# Patient Record
Sex: Female | Born: 1970 | Race: White | Hispanic: No | Marital: Married | Smoking: Never smoker
Health system: Southern US, Community
[De-identification: ages and names within clinical notes are randomized; demographics above are authoritative.]

## PROBLEM LIST (undated history)

## (undated) DIAGNOSIS — T7840XA Allergy, unspecified, initial encounter: Secondary | ICD-10-CM

## (undated) DIAGNOSIS — G43909 Migraine, unspecified, not intractable, without status migrainosus: Secondary | ICD-10-CM

## (undated) HISTORY — DX: Migraine, unspecified, not intractable, without status migrainosus: G43.909

## (undated) HISTORY — PX: CHOLECYSTECTOMY: SHX55

## (undated) HISTORY — DX: Allergy, unspecified, initial encounter: T78.40XA

## (undated) HISTORY — PX: ADENOIDECTOMY: SUR15

## (undated) HISTORY — PX: EYE SURGERY: SHX253

---

## 1998-12-14 ENCOUNTER — Other Ambulatory Visit: Admission: RE | Admit: 1998-12-14 | Discharge: 1998-12-14 | Payer: Self-pay | Admitting: Family Medicine

## 1999-12-20 ENCOUNTER — Other Ambulatory Visit: Admission: RE | Admit: 1999-12-20 | Discharge: 1999-12-20 | Payer: Self-pay | Admitting: *Deleted

## 2005-05-03 ENCOUNTER — Other Ambulatory Visit: Admission: RE | Admit: 2005-05-03 | Discharge: 2005-05-03 | Payer: Self-pay | Admitting: Family Medicine

## 2006-05-11 ENCOUNTER — Other Ambulatory Visit: Admission: RE | Admit: 2006-05-11 | Discharge: 2006-05-11 | Payer: Self-pay | Admitting: Family Medicine

## 2007-07-11 HISTORY — PX: CHOLECYSTECTOMY: SHX55

## 2007-10-09 ENCOUNTER — Other Ambulatory Visit: Admission: RE | Admit: 2007-10-09 | Discharge: 2007-10-09 | Payer: Self-pay | Admitting: Family Medicine

## 2008-03-29 ENCOUNTER — Emergency Department (HOSPITAL_COMMUNITY): Admission: EM | Admit: 2008-03-29 | Discharge: 2008-03-29 | Payer: Self-pay | Admitting: Emergency Medicine

## 2008-05-06 ENCOUNTER — Ambulatory Visit (HOSPITAL_COMMUNITY): Admission: RE | Admit: 2008-05-06 | Discharge: 2008-05-06 | Payer: Self-pay | Admitting: General Surgery

## 2008-05-06 ENCOUNTER — Encounter (INDEPENDENT_AMBULATORY_CARE_PROVIDER_SITE_OTHER): Payer: Self-pay | Admitting: General Surgery

## 2008-10-13 ENCOUNTER — Encounter: Admission: RE | Admit: 2008-10-13 | Discharge: 2008-10-13 | Payer: Self-pay | Admitting: Family Medicine

## 2008-10-13 ENCOUNTER — Other Ambulatory Visit: Admission: RE | Admit: 2008-10-13 | Discharge: 2008-10-13 | Payer: Self-pay | Admitting: Family Medicine

## 2009-11-02 ENCOUNTER — Encounter: Admission: RE | Admit: 2009-11-02 | Discharge: 2009-11-02 | Payer: Self-pay | Admitting: Family Medicine

## 2010-09-23 ENCOUNTER — Other Ambulatory Visit (HOSPITAL_COMMUNITY)
Admission: RE | Admit: 2010-09-23 | Discharge: 2010-09-23 | Disposition: A | Discharge status: Home / Self Care | Payer: BC Managed Care – PPO | Source: Ambulatory Visit | Attending: Family Medicine | Admitting: Family Medicine

## 2010-09-23 ENCOUNTER — Other Ambulatory Visit: Payer: Self-pay | Admitting: Family Medicine

## 2010-09-23 DIAGNOSIS — Z01419 Encounter for gynecological examination (general) (routine) without abnormal findings: Secondary | ICD-10-CM | POA: Insufficient documentation

## 2010-11-22 NOTE — Op Note (Signed)
Pamela Lin, Pamela Lin                ACCOUNT NO.:  [id number]   MEDICAL RECORD NO.:  [id number]          PATIENT TYPE:  AMB   LOCATION:  DAY                          FACILITY:  [facility]   PHYSICIAN:  Anselm Pancoast. Weatherly, M.D.DATE OF BIRTH:  Oct 10, 1970   DATE OF PROCEDURE:  05/06/2008  DATE OF DISCHARGE:                               OPERATIVE REPORT   PREOPERATIVE DIAGNOSIS:  Chronic cholecystitis with stones.   POSTOPERATIVE DIAGNOSIS:  Chronic cholecystitis with stones.   OPERATION:  Laparoscopic cholecystectomy with cholangiogram.   SURGEON:  Anselm Pancoast. Zachery Dakins, MD   ASSISTANT:  Almond Lint, MD   ANESTHESIA:  General anesthesia.   HISTORY:  Pamela Lin is a 40 year old Caucasian female who is referred  to me through the courtesy of Dr. Wynelle Link with symptomatic gallstones.  The  patient has had no previous pregnancies.  She is presently trying to  become pregnant and not on birth control pills but she has had some  intermittent episodes of upper abdominal pain and the most significant  prompted her to go to the emergency room.  While she was waiting her  symptoms subsided and the nurse talked to her and said it sounded like  she was having a gallbladder attack.  She then saw her regular  physician, did not actually get seen by the emergency room physician,  and they ordered laboratory studies and then an ultrasound that showed  stones within her gallbladder.  Her liver tests were unremarkable and  she has had other little episodes of pain but not really that of a  continuous like acute or subacute cholecystitis.  She has lost about 40  pounds.  She is no longer on birth control pills and otherwise is in  normal condition.  She has not had a previous pregnancy.   Her repeat CBC is unremarkable.  I did not repeat the liver function  studies but she was given 3 grams of Unasyn  and taken to the operative  suite.  The abdomen after induction of general anesthesia and oral tube  to  the stomach was prepped with Betadine solution and draped in a  sterile manner.  A small incision was made below the umbilicus.  The  fascia was identified and picked up between two Kochers and kind of  carefully entered the peritoneal cavity.  The upper 10 mm trocar was  placed under direct vision.  The gallbladder was not acutely inflamed  but had a lot of adhesions around it as if she has had previous attacks.  The two lateral 5 mm trocars were placed by Dr. Donell Beers and at first we  kind of lifted the gallbladder up superiorly and then the adhesions were  carefully dissected using the scissors with very minimal cautery and the  duodenum was kind of not inflamed but some adhesions going to the fundus  of the gallbladder.  These were carefully divided and then you could see  the cystic duct which was encompassing the right angle.  I made a clip  with a function of the cystic duct/gallbladder junction and then made a  little opening proximally.  There was a little bit of bleeder that  required light coagulation on the cystic duct and then the catheter was  inserted and held in place with clip.  The x-ray was obtained which  showed that she kind of had a tortuous cystic duct that is laying on top  of the common bile duct but the intrahepatic radicals filled nicely and  good flow into the duodenum.  The catheter was removed.  The cystic duct  was triply clipped.  I looked at it carefully where we had coagulated it  and the clips were proximal to that little area and then the cystic  artery was identified, encompassed, doubly clipped proximally, singly  distally and divided and then the gallbladder was freed from its liver.  The adhesions were carefully coagulated.  The gallbladder was placed in  the EndoCatch bag and then with the camera switched to the upper trocar  withdrawn.  There was a pretty prominent stone in the fundus of the  gallbladder.  The irrigating fluid was aspirated.  I looked  at where the  cystic duct and cystic artery had been clipped and there was no evidence  of bleeding and then we put an additional figure-of-eight suture of 0  Vicryl in the fascia at the umbilicus, removed the 5 mm ports under  direct vision and then withdrew the upper trocar.  I did put a figure-of-  eight of 0 Vicryl in the fascia at the umbilicus.  She has got pretty  strong fascia but you can see into the peritoneal cavity.  The  subcutaneous wounds were closed with 4-0 Vicryl, benzoin and Steri-  Strips on the skin.  The patient tolerated the procedure nicely and was  sent to the recovery room in stable postop condition.  I think she is  planning on going home this afternoon and she should have no problems  with that.  I will see her back in the office in approximately 2 weeks.  She will resume her chronic medications.           ______________________________  Anselm Pancoast. Zachery Dakins, M.D.     WJW/MEDQ  D:  05/06/2008  T:  05/06/2008  Job:  [id number]   cc:   Deatra James, M.D.  Fax: [phone]

## 2011-04-10 LAB — URINALYSIS, ROUTINE W REFLEX MICROSCOPIC
Bilirubin Urine: NEGATIVE
Glucose, UA: NEGATIVE
Ketones, ur: NEGATIVE
Urobilinogen, UA: 0.2
pH: 5.5

## 2011-04-10 LAB — POCT PREGNANCY, URINE: Preg Test, Ur: NEGATIVE

## 2011-04-11 LAB — COMPREHENSIVE METABOLIC PANEL
Albumin: 3.9
Chloride: 106
GFR calc Af Amer: >60
GFR calc non Af Amer: >60
Glucose, Bld: 101 — ABNORMAL HIGH
Sodium: 139
Total Bilirubin: 1
Total Protein: 6.4

## 2011-04-11 LAB — CBC
HCT: 38.1
MCHC: 34.1
MCV: 91.8
RDW: 13.3

## 2011-04-11 LAB — DIFFERENTIAL
Basophils Relative: 1
Monocytes Relative: 13 — ABNORMAL HIGH
Neutro Abs: 2.7
Neutrophils Relative %: 55

## 2012-06-20 ENCOUNTER — Other Ambulatory Visit: Payer: Self-pay | Admitting: Family Medicine

## 2012-06-20 DIAGNOSIS — Z1231 Encounter for screening mammogram for malignant neoplasm of breast: Secondary | ICD-10-CM

## 2012-08-05 ENCOUNTER — Ambulatory Visit
Admission: RE | Admit: 2012-08-05 | Discharge: 2012-08-05 | Disposition: A | Discharge status: Home / Self Care | Payer: BC Managed Care – PPO | Source: Ambulatory Visit | Attending: Family Medicine | Admitting: Family Medicine

## 2012-08-05 DIAGNOSIS — Z1231 Encounter for screening mammogram for malignant neoplasm of breast: Secondary | ICD-10-CM

## 2013-02-14 ENCOUNTER — Other Ambulatory Visit (HOSPITAL_COMMUNITY)
Admission: RE | Admit: 2013-02-14 | Discharge: 2013-02-14 | Disposition: A | Discharge status: Home / Self Care | Payer: BC Managed Care – PPO | Source: Ambulatory Visit | Attending: Family Medicine | Admitting: Family Medicine

## 2013-02-14 ENCOUNTER — Other Ambulatory Visit: Payer: Self-pay | Admitting: Family Medicine

## 2013-02-14 DIAGNOSIS — Z124 Encounter for screening for malignant neoplasm of cervix: Secondary | ICD-10-CM | POA: Insufficient documentation

## 2013-02-14 LAB — HM PAP SMEAR: HM PAP: NEGATIVE

## 2014-01-14 ENCOUNTER — Other Ambulatory Visit: Payer: Self-pay

## 2014-01-14 DIAGNOSIS — Z1231 Encounter for screening mammogram for malignant neoplasm of breast: Secondary | ICD-10-CM

## 2014-01-15 ENCOUNTER — Encounter (INDEPENDENT_AMBULATORY_CARE_PROVIDER_SITE_OTHER): Payer: Self-pay

## 2014-01-15 ENCOUNTER — Ambulatory Visit
Admission: RE | Admit: 2014-01-15 | Discharge: 2014-01-15 | Disposition: A | Discharge status: Home / Self Care | Payer: BC Managed Care – PPO | Source: Ambulatory Visit

## 2014-01-15 DIAGNOSIS — Z1231 Encounter for screening mammogram for malignant neoplasm of breast: Secondary | ICD-10-CM

## 2015-01-07 ENCOUNTER — Other Ambulatory Visit: Payer: Self-pay

## 2015-01-07 DIAGNOSIS — Z1231 Encounter for screening mammogram for malignant neoplasm of breast: Secondary | ICD-10-CM

## 2015-01-21 ENCOUNTER — Ambulatory Visit
Admission: RE | Admit: 2015-01-21 | Discharge: 2015-01-21 | Disposition: A | Discharge status: Home / Self Care | Payer: BC Managed Care – PPO | Source: Ambulatory Visit

## 2015-01-21 DIAGNOSIS — Z1231 Encounter for screening mammogram for malignant neoplasm of breast: Secondary | ICD-10-CM

## 2015-09-06 LAB — BASIC METABOLIC PANEL
BUN: 13 mg/dL (ref 4–21)
Creatinine: 0.6 mg/dL (ref 0.5–1.1)
GLUCOSE: 93 mg/dL
Potassium: 4 mmol/L (ref 3.4–5.3)
Sodium: 138 mmol/L (ref 137–147)

## 2015-09-06 LAB — TSH: TSH: 1.18 u[IU]/mL (ref 0.41–5.90)

## 2015-09-06 LAB — FOLLICLE STIMULATING HORMONE: FSH: 75.8

## 2016-01-14 ENCOUNTER — Other Ambulatory Visit: Payer: Self-pay | Admitting: Family Medicine

## 2016-01-14 DIAGNOSIS — Z1231 Encounter for screening mammogram for malignant neoplasm of breast: Secondary | ICD-10-CM

## 2016-01-28 ENCOUNTER — Ambulatory Visit
Admission: RE | Admit: 2016-01-28 | Discharge: 2016-01-28 | Disposition: A | Discharge status: Home / Self Care | Payer: BC Managed Care – PPO | Source: Ambulatory Visit | Attending: Family Medicine | Admitting: Family Medicine

## 2016-01-28 DIAGNOSIS — Z1231 Encounter for screening mammogram for malignant neoplasm of breast: Secondary | ICD-10-CM

## 2016-01-28 LAB — HM MAMMOGRAPHY

## 2016-10-12 ENCOUNTER — Ambulatory Visit (INDEPENDENT_AMBULATORY_CARE_PROVIDER_SITE_OTHER): Payer: BC Managed Care – PPO | Admitting: Adult Health

## 2016-10-12 ENCOUNTER — Encounter: Payer: Self-pay | Admitting: Adult Health

## 2016-10-12 DIAGNOSIS — Z0000 Encounter for general adult medical examination without abnormal findings: Secondary | ICD-10-CM | POA: Diagnosis not present

## 2016-10-12 DIAGNOSIS — Z9109 Other allergy status, other than to drugs and biological substances: Secondary | ICD-10-CM

## 2016-10-12 DIAGNOSIS — E669 Obesity, unspecified: Secondary | ICD-10-CM

## 2016-10-12 DIAGNOSIS — E6609 Other obesity due to excess calories: Secondary | ICD-10-CM | POA: Insufficient documentation

## 2016-10-12 NOTE — Assessment & Plan Note (Signed)
Previous allergy testing revealed allergy to dust mites. Continue Azelastine and Fluticasone nasal sprays as directed. OTC antihistamine when sx' flare up.

## 2016-10-12 NOTE — Assessment & Plan Note (Signed)
  Increase regular exercise-YouTube, Pinterest exercise programs, add in weight training. Increase water consumption to at least 100 ounces/daily. Please schedule CPE/fasting labs at your convenience.

## 2016-10-12 NOTE — Progress Notes (Signed)
Subjective:    Patient ID: Pamela Lin, female    DOB: 30-Mar-1971, 46 y.o.   MRN: [id number]  HPI:  Pamela Lin presents to establish as a new pt. She is a pleasant 46 year old woman.  PMH:  Dust Mite allergy and menopause.  She follows a vegetarian diet, drinks > 60 ounces of water daily, and achieves 8,000-11,000 steps daily.  Since menopause she has gained > 30 lbs.  Her comfortable/goal weight is 160-165#.  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    Patient Care Team    Relationship Specialty Notifications Start End  Pamela Aquas, NP PCP - General Family Medicine  10/12/16     Patient Active Problem List   Diagnosis Date Noted  . Health care maintenance 10/12/2016     Past Medical History:  Diagnosis Date  . Allergy      Past Surgical History:  Procedure Laterality Date  . ADENOIDECTOMY    . CHOLECYSTECTOMY       Family History  Problem Relation Age of Onset  . Cancer Mother     breast  . Cancer Father     colon  . Cancer Maternal Aunt     breast  . Cancer Paternal Aunt     breast  . Cancer Maternal Grandmother     breast  . Cancer Paternal Grandmother     breast     History  Drug Use No     History  Alcohol Use  . Yes    Comment: socially     History  Smoking Status  . Never Smoker  Smokeless Tobacco  . Never Used     Outpatient Encounter Prescriptions as of 10/12/2016  Medication Sig  . azelastine (ASTELIN) 0.1 % nasal spray Place 2 sprays into both nostrils as needed for rhinitis. Use in each nostril as directed  . Calcium Carbonate-Vitamin D3 (CALCIUM 600-D) 600-400 MG-UNIT TABS Take 2 tablets by mouth daily.  . fluticasone (FLONASE) 50 MCG/ACT nasal spray Place 2 sprays into both nostrils as needed for allergies or rhinitis.  Marland Kitchen L-Phenylalanine 500 MG TABS Take 1-2 tablets by mouth daily.   No facility-administered encounter medications on file as of 10/12/2016.     Allergies: Dust mite extract  Body mass index is 33.13 kg/m.  Blood pressure  112/73, pulse (!) 59, height 5' 5.75" (1.67 m), weight 203 lb 11.2 oz (92.4 kg).   Review of Systems  Constitutional: Negative for activity change, appetite change, chills, diaphoresis, fatigue, fever and unexpected weight change.  HENT: Negative for congestion.   Eyes: Negative for visual disturbance.  Respiratory: Negative for cough, chest tightness, shortness of breath, wheezing and stridor.   Cardiovascular: Negative for chest pain, palpitations and leg swelling.  Gastrointestinal: Negative for abdominal distention, abdominal pain, blood in stool, constipation, diarrhea, nausea and vomiting.  Endocrine: Negative for cold intolerance, heat intolerance, polydipsia, polyphagia and polyuria.  Genitourinary: Positive for menstrual problem. Negative for difficulty urinating and flank pain.       LMP- Fall 2016 Experiencing intermittent hot flashes and 30 lb weight gain.  Musculoskeletal: Negative for arthralgias, back pain, gait problem, joint swelling, myalgias, neck pain and neck stiffness.  Skin: Negative for color change, pallor, rash and wound.  Allergic/Immunologic: Negative for immunocompromised state.  Neurological: Negative for dizziness and headaches.  Hematological: Does not bruise/bleed easily.  Psychiatric/Behavioral: Negative for agitation, dysphoric mood and sleep disturbance. The patient is not nervous/anxious and is not hyperactive.  Objective:   Physical Exam  Constitutional: She is oriented to person, place, and time. She appears well-developed and well-nourished. No distress.  HENT:  Head: Normocephalic and atraumatic.  Right Ear: Hearing, tympanic membrane and external ear normal. No decreased hearing is noted.  Left Ear: Hearing, tympanic membrane and external ear normal. No decreased hearing is noted.  Nose: Nose normal. Right sinus exhibits no maxillary sinus tenderness and no frontal sinus tenderness. Left sinus exhibits no maxillary sinus tenderness and no  frontal sinus tenderness.  Mouth/Throat: Uvula is midline.  Eyes: Conjunctivae are normal.  Neck: Normal range of motion. Neck supple.  Cardiovascular: Normal rate, regular rhythm, normal heart sounds and intact distal pulses.   No murmur heard. Pulmonary/Chest: Effort normal and breath sounds normal. No respiratory distress. She has no wheezes. She has no rales. She exhibits no tenderness.  Abdominal: Soft. Bowel sounds are normal. She exhibits no distension and no mass. There is no tenderness. There is no rebound and no guarding.  Musculoskeletal: Normal range of motion.  Lymphadenopathy:    She has no cervical adenopathy.  Neurological: She is alert and oriented to person, place, and time.  Skin: Skin is warm and dry. No rash noted. She is not diaphoretic. No erythema. No pallor.  Psychiatric: She has a normal mood and affect. Her behavior is normal. Judgment and thought content normal.  Nursing note and vitals reviewed.         Assessment & Plan:   1. Health care maintenance   2. Obesity (BMI 30-39.9)   3. History of environmental allergies     Health care maintenance  Increase regular exercise-YouTube, Pinterest exercise programs, add in weight training. Increase water consumption to at least 100 ounces/daily. Please schedule CPE/fasting labs at your convenience.   History of environmental allergies Previous allergy testing revealed allergy to dust mites. Continue Azelastine and Fluticasone nasal sprays as directed. OTC antihistamine when sx' flare up.    FOLLOW-UP:  Return in about 1 year (around 10/12/2017) for Regular Follow Up.

## 2016-10-12 NOTE — Patient Instructions (Signed)
Heart-Healthy Eating Plan Many factors influence your heart health, including eating and exercise habits. Heart (coronary) risk increases with abnormal blood fat (lipid) levels. Heart-healthy meal planning includes limiting unhealthy fats, increasing healthy fats, and making other small dietary changes. This includes maintaining a healthy body weight to help keep lipid levels within a normal range. What is my plan? Your health care provider recommends that you:  Get no more than _________% of the total calories in your daily diet from fat.  Limit your intake of saturated fat to less than _________% of your total calories each day.  Limit the amount of cholesterol in your diet to less than _________ mg per day. What types of fat should I choose?  Choose healthy fats more often. Choose monounsaturated and polyunsaturated fats, such as olive oil and canola oil, flaxseeds, walnuts, almonds, and seeds.  Eat more omega-3 fats. Good choices include salmon, mackerel, sardines, tuna, flaxseed oil, and ground flaxseeds. Aim to eat fish at least two times each week.  Limit saturated fats. Saturated fats are primarily found in animal products, such as meats, butter, and cream. Plant sources of saturated fats include palm oil, palm kernel oil, and coconut oil.  Avoid foods with partially hydrogenated oils in them. These contain trans fats. Examples of foods that contain trans fats are stick margarine, some tub margarines, cookies, crackers, and other baked goods. What general guidelines do I need to follow?  Check food labels carefully to identify foods with trans fats or high amounts of saturated fat.  Fill one half of your plate with vegetables and green salads. Eat 4-5 servings of vegetables per day. A serving of vegetables equals 1 cup of raw leafy vegetables,  cup of raw or cooked cut-up vegetables, or  cup of vegetable juice.  Fill one fourth of your plate with whole grains. Look for the word  "whole" as the first word in the ingredient list.  Fill one fourth of your plate with lean protein foods.  Eat 4-5 servings of fruit per day. A serving of fruit equals one medium whole fruit,  cup of dried fruit,  cup of fresh, frozen, or canned fruit, or  cup of 100% fruit juice.  Eat more foods that contain soluble fiber. Examples of foods that contain this type of fiber are apples, broccoli, carrots, beans, peas, and barley. Aim to get 20-30 g of fiber per day.  Eat more home-cooked food and less restaurant, buffet, and fast food.  Limit or avoid alcohol.  Limit foods that are high in starch and sugar.  Avoid fried foods.  Cook foods by using methods other than frying. Baking, boiling, grilling, and broiling are all great options. Other fat-reducing suggestions include:  Removing the skin from poultry.  Removing all visible fats from meats.  Skimming the fat off of stews, soups, and gravies before serving them.  Steaming vegetables in water or broth.  Lose weight if you are overweight. Losing just 5-10% of your initial body weight can help your overall health and prevent diseases such as diabetes and heart disease.  Increase your consumption of nuts, legumes, and seeds to 4-5 servings per week. One serving of dried beans or legumes equals  cup after being cooked, one serving of nuts equals 1 ounces, and one serving of seeds equals  ounce or 1 tablespoon.  You may need to monitor your salt (sodium) intake, especially if you have high blood pressure. Talk with your health care provider or dietitian to get more  information about reducing sodium. What foods can I eat? Grains   Breads, including [place], white, pita, wheat, raisin, rye, oatmeal, and [place]. Tortillas that are neither fried nor made with lard or trans fat. Low-fat rolls, including hotdog and hamburger buns and English muffins. Biscuits. Muffins. Waffles. Pancakes. Light popcorn. Whole-grain cereals. Flatbread.  Melba toast. Pretzels. Breadsticks. Rusks. Low-fat snacks and crackers, including oyster, saltine, matzo, graham, animal, and rye. Rice and pasta, including [place] rice and those that are made with whole wheat. Vegetables  All vegetables. Fruits  All fruits, but limit coconut. Meats and Other Protein Sources  Lean, well-trimmed beef, veal, pork, and lamb. Chicken and [place] without skin. All fish and shellfish. Wild duck, rabbit, pheasant, and venison. Egg whites or low-cholesterol egg substitutes. Dried beans, peas, lentils, and tofu.Seeds and most nuts. Dairy  Low-fat or nonfat cheeses, including ricotta, string, and mozzarella. Skim or 1% milk that is liquid, powdered, or evaporated. Buttermilk that is made with low-fat milk. Nonfat or low-fat yogurt. Beverages  Mineral water. Diet carbonated beverages. Sweets and Desserts  Sherbets and fruit ices. Honey, jam, marmalade, jelly, and syrups. Meringues and gelatins. Pure sugar candy, such as hard candy, jelly beans, gumdrops, mints, marshmallows, and small amounts of dark chocolate. [employer] Eat all sweets and desserts in moderation. Fats and Oils  Nonhydrogenated (trans-free) margarines. Vegetable oils, including soybean, sesame, sunflower, olive, peanut, safflower, corn, canola, and cottonseed. Salad dressings or mayonnaise that are made with a vegetable oil. Limit added fats and oils that you use for cooking, baking, salads, and as spreads. Other  Cocoa powder. Coffee and tea. All seasonings and condiments. The items listed above may not be a complete list of recommended foods or beverages. Contact your dietitian for more options.  What foods are not recommended? Grains  Breads that are made with saturated or trans fats, oils, or whole milk. Croissants. Butter rolls. Cheese breads. Sweet rolls. Donuts. Buttered popcorn. Chow mein noodles. High-fat crackers, such as cheese or butter crackers. Meats and Other Protein Sources  Fatty  meats, such as hotdogs, short ribs, sausage, spareribs, bacon, ribeye roast or steak, and mutton. High-fat deli meats, such as salami and bologna. Caviar. Domestic duck and goose. Organ meats, such as kidney, liver, sweetbreads, brains, gizzard, chitterlings, and heart. Dairy  Cream, sour cream, cream cheese, and creamed cottage cheese. Whole milk cheeses, including blue (bleu), Monterey Jack, [place], [place], American, [place], Swiss, [place], [place], and [place]. Whole or 2% milk that is liquid, evaporated, or condensed. Whole buttermilk. Cream sauce or high-fat cheese sauce. Yogurt that is made from whole milk. Beverages  Regular sodas and drinks with added sugar. Sweets and Desserts  Frosting. Pudding. Cookies. Cakes other than angel food cake. Candy that has milk chocolate or white chocolate, hydrogenated fat, butter, coconut, or unknown ingredients. Buttered syrups. Full-fat ice cream or ice cream drinks. Fats and Oils  Gravy that has suet, meat fat, or shortening. Cocoa butter, hydrogenated oils, palm oil, coconut oil, palm kernel oil. These can often be found in baked products, candy, fried foods, nondairy creamers, and whipped toppings. Solid fats and shortenings, including bacon fat, salt pork, lard, and butter. Nondairy cream substitutes, such as coffee creamers and sour cream substitutes. Salad dressings that are made of unknown oils, cheese, or sour cream. The items listed above may not be a complete list of foods and beverages to avoid. Contact your dietitian for more information.  This information is not intended to replace advice given to you by  your health care provider. Make sure you discuss any questions you have with your health care provider. Document Released: 04/04/2008 Document Revised: 01/14/2016 Document Reviewed: 12/18/2013 Elsevier Interactive Patient Education  2017 [employer].  Increase regular exercise-YouTube, Pinterest exercise programs, add in [employer]  training. Increase water consumption to at least 100 ounces/daily. Please schedule CPE/fasting labs at your convenience.

## 2016-10-13 ENCOUNTER — Other Ambulatory Visit (INDEPENDENT_AMBULATORY_CARE_PROVIDER_SITE_OTHER): Payer: BC Managed Care – PPO

## 2016-10-13 ENCOUNTER — Encounter: Payer: Self-pay | Admitting: Adult Health

## 2016-10-13 DIAGNOSIS — Z0000 Encounter for general adult medical examination without abnormal findings: Secondary | ICD-10-CM

## 2016-10-14 LAB — CBC WITH DIFFERENTIAL/PLATELET
BASOS ABS: 0 10*3/uL (ref 0.0–0.2)
Basos: 0 %
EOS (ABSOLUTE): 0 10*3/uL (ref 0.0–0.4)
Eos: 0 %
HEMOGLOBIN: 13.7 g/dL (ref 11.1–15.9)
Hematocrit: 37.7 % (ref 34.0–46.6)
Immature Grans (Abs): 0 10*3/uL (ref 0.0–0.1)
Immature Granulocytes: 0 %
LYMPHS ABS: 1.6 10*3/uL (ref 0.7–3.1)
Lymphs: 34 %
MCH: 30.1 pg (ref 26.6–33.0)
MCHC: 36.3 g/dL — AB (ref 31.5–35.7)
MCV: 83 fL (ref 79–97)
MONOS ABS: 0.4 10*3/uL (ref 0.1–0.9)
Monocytes: 8 %
NEUTROS ABS: 2.7 10*3/uL (ref 1.4–7.0)
Neutrophils: 58 %
Platelets: 191 10*3/uL (ref 150–379)
RBC: 4.55 x10E6/uL (ref 3.77–5.28)
RDW: 13.9 % (ref 12.3–15.4)
WBC: 4.7 10*3/uL (ref 3.4–10.8)

## 2016-10-14 LAB — LIPID PANEL
Chol/HDL Ratio: 3.8 ratio (ref 0.0–4.4)
Cholesterol, Total: 179 mg/dL (ref 100–199)
HDL: 47 mg/dL (ref >39)
LDL Calculated: 106 mg/dL — ABNORMAL HIGH (ref 0–99)
Triglycerides: 130 mg/dL (ref 0–149)
VLDL Cholesterol Cal: 26 mg/dL (ref 5–40)

## 2016-10-14 LAB — COMPREHENSIVE METABOLIC PANEL
ALBUMIN: 4 g/dL (ref 3.5–5.5)
ALK PHOS: 61 IU/L (ref 39–117)
ALT: 20 IU/L (ref 0–32)
AST: 18 IU/L (ref 0–40)
Albumin/Globulin Ratio: 1.5 (ref 1.2–2.2)
BILIRUBIN TOTAL: 0.4 mg/dL (ref 0.0–1.2)
BUN / CREAT RATIO: 10 (ref 9–23)
BUN: 6 mg/dL (ref 6–24)
CHLORIDE: 104 mmol/L (ref 96–106)
CO2: 27 mmol/L (ref 18–29)
CREATININE: 0.59 mg/dL (ref 0.57–1.00)
Calcium: 9.2 mg/dL (ref 8.7–10.2)
GFR calc Af Amer: 125 mL/min/{1.73_m2} (ref >59)
GFR calc non Af Amer: 109 mL/min/{1.73_m2} (ref >59)
GLOBULIN, TOTAL: 2.7 g/dL (ref 1.5–4.5)
GLUCOSE: 95 mg/dL (ref 65–99)
Potassium: 4.9 mmol/L (ref 3.5–5.2)
SODIUM: 143 mmol/L (ref 134–144)
Total Protein: 6.7 g/dL (ref 6.0–8.5)

## 2016-10-14 LAB — HEMOGLOBIN A1C
ESTIMATED AVERAGE GLUCOSE: 105 mg/dL
HEMOGLOBIN A1C: 5.3 % (ref 4.8–5.6)

## 2016-10-14 LAB — TSH: TSH: 1.43 u[IU]/mL (ref 0.450–4.500)

## 2016-10-14 LAB — VITAMIN D 25 HYDROXY (VIT D DEFICIENCY, FRACTURES): VIT D 25 HYDROXY: 33.9 ng/mL (ref 30.0–100.0)

## 2016-11-15 ENCOUNTER — Encounter: Payer: Self-pay | Admitting: Adult Health

## 2017-01-16 ENCOUNTER — Other Ambulatory Visit: Payer: Self-pay | Admitting: Adult Health

## 2017-01-16 DIAGNOSIS — Z1231 Encounter for screening mammogram for malignant neoplasm of breast: Secondary | ICD-10-CM

## 2017-02-06 ENCOUNTER — Ambulatory Visit
Admission: RE | Admit: 2017-02-06 | Discharge: 2017-02-06 | Disposition: A | Discharge status: Home / Self Care | Payer: BC Managed Care – PPO | Source: Ambulatory Visit | Attending: Adult Health | Admitting: Adult Health

## 2017-02-06 DIAGNOSIS — Z1231 Encounter for screening mammogram for malignant neoplasm of breast: Secondary | ICD-10-CM

## 2017-02-27 ENCOUNTER — Encounter: Payer: Self-pay | Admitting: Adult Health

## 2017-02-27 ENCOUNTER — Ambulatory Visit (INDEPENDENT_AMBULATORY_CARE_PROVIDER_SITE_OTHER): Payer: BC Managed Care – PPO | Admitting: Adult Health

## 2017-02-27 DIAGNOSIS — J0110 Acute frontal sinusitis, unspecified: Secondary | ICD-10-CM

## 2017-02-27 MED ORDER — AMOXICILLIN-POT CLAVULANATE 875-125 MG PO TABS: 1.0000 | ORAL_TABLET | Freq: Two times a day (BID) | ORAL | 0 refills | Status: DC

## 2017-02-27 NOTE — Progress Notes (Signed)
Subjective:    Patient ID: Pamela Lin, female    DOB: Sep 12, 1970, 46 y.o.   MRN: [id number]  HPI: Pamela Lin presents with sore throat (5/10), frontal HA (7/10), non-productive cough, fever, and malaise for >10 days.  Sx's slightly improved over weekend then significantly worsened yesterday morning.  She has been pushing fluids and using OTC Ibuprofen with only minimal relief. She denies tobacco use.  Patient Care Team    Relationship Specialty Notifications Start End  Esaw Grandchild, NP PCP - General Family Medicine  10/12/16     Patient Active Problem List   Diagnosis Date Noted  . Sinusitis, acute frontal 02/27/2017  . Health care maintenance 10/12/2016  . Obesity (BMI 30-39.9) 10/12/2016  . History of environmental allergies 10/12/2016     Past Medical History:  Diagnosis Date  . Allergy      Past Surgical History:  Procedure Laterality Date  . ADENOIDECTOMY    . CHOLECYSTECTOMY       Family History  Problem Relation Age of Onset  . Cancer Mother        breast  . Breast cancer Mother   . Cancer Father        colon  . Cancer Maternal Aunt        breast  . Breast cancer Maternal Aunt   . Cancer Paternal Aunt        breast  . Breast cancer Paternal Aunt   . Cancer Maternal Grandmother        breast  . Breast cancer Maternal Grandmother   . Cancer Paternal Grandmother        breast  . Breast cancer Paternal Grandmother      History  Drug Use No     History  Alcohol Use  . Yes    Comment: socially     History  Smoking Status  . Never Smoker  Smokeless Tobacco  . Never Used     Outpatient Encounter Prescriptions as of 02/27/2017  Medication Sig  . azelastine (ASTELIN) 0.1 % nasal spray Place 2 sprays into both nostrils as needed for rhinitis. Use in each nostril as directed  . Calcium Carbonate-Vitamin D3 (CALCIUM 600-D) 600-400 MG-UNIT TABS Take 2 tablets by mouth daily.  . fluticasone (FLONASE) 50 MCG/ACT nasal spray Place 2 sprays  into both nostrils as needed for allergies or rhinitis.  . TURMERIC PO Take by mouth.  . [DISCONTINUED] L-Phenylalanine 500 MG TABS Take 1-2 tablets by mouth daily.  Marland Kitchen amoxicillin-clavulanate (AUGMENTIN) 875-125 MG tablet Take 1 tablet by mouth 2 (two) times daily.   No facility-administered encounter medications on file as of 02/27/2017.     Allergies: Dust mite extract  Body mass index is 31.05 kg/m.  Blood pressure 124/76, pulse 62, temperature 98.5 F (36.9 C), temperature source Oral, height 5' 5.75" (1.67 m), weight 190 lb 14.4 oz (86.6 kg).     Review of Systems  Constitutional: Positive for activity change, fatigue and fever. Negative for appetite change, chills, diaphoresis and unexpected weight change.  HENT: Positive for congestion, postnasal drip, rhinorrhea, sinus pain, sinus pressure, sneezing, sore throat and voice change. Negative for trouble swallowing.   Eyes: Negative for visual disturbance.  Respiratory: Positive for cough. Negative for chest tightness, shortness of breath, wheezing and stridor.   Cardiovascular: Negative for chest pain, palpitations and leg swelling.  Gastrointestinal: Negative for abdominal distention, abdominal pain, blood in stool, constipation, diarrhea, nausea and vomiting.  Endocrine: Negative for cold intolerance, heat intolerance,  polydipsia, polyphagia and polyuria.  Genitourinary: Negative for difficulty urinating and flank pain.  Neurological: Positive for dizziness and headaches. Negative for tremors and weakness.  Hematological: Does not bruise/bleed easily.  Psychiatric/Behavioral: Positive for sleep disturbance.       Objective:   Physical Exam  Constitutional: She appears well-developed and well-nourished. No distress.  HENT:  Head: Normocephalic and atraumatic.  Right Ear: Hearing, external ear and ear canal normal. Tympanic membrane is erythematous and bulging. No decreased hearing is noted.  Left Ear: Hearing, external  ear and ear canal normal. Tympanic membrane is erythematous and bulging. No decreased hearing is noted.  Nose: Mucosal edema and rhinorrhea present. Right sinus exhibits frontal sinus tenderness. Right sinus exhibits no maxillary sinus tenderness. Left sinus exhibits frontal sinus tenderness. Left sinus exhibits no maxillary sinus tenderness.  Mouth/Throat: Uvula is midline, oropharynx is clear and moist and mucous membranes are normal.  Cardiovascular: Normal rate, regular rhythm, normal heart sounds and intact distal pulses.   No murmur heard. Pulmonary/Chest: Effort normal and breath sounds normal. No respiratory distress. She has no wheezes. She has no rales. She exhibits no tenderness.  Skin: Skin is warm and intact. She is not diaphoretic.  Very warm to the touch  Psychiatric: She has a normal mood and affect. Her behavior is normal. Judgment and thought content normal.  Nursing note and vitals reviewed.         Assessment & Plan:   1. Acute frontal sinusitis, recurrence not specified     Sinusitis, acute frontal Augmentin 875 BID x 10 days Increase fluids/rest/vit c Alternate OTC Ibuprofen and Acetaminophen. Use nasal sprays as directed. If sx's persist after ABX completed, then call clinic.     FOLLOW-UP:  Return if symptoms worsen or fail to improve.

## 2017-02-27 NOTE — Progress Notes (Signed)
124/76 62 

## 2017-02-27 NOTE — Assessment & Plan Note (Signed)
Augmentin 875 BID x 10 days Increase fluids/rest/vit c Alternate OTC Ibuprofen and Acetaminophen. Use nasal sprays as directed. If sx's persist after ABX completed, then call clinic.

## 2017-02-27 NOTE — Patient Instructions (Signed)
Sinus Headache A sinus headache occurs when the paranasal sinuses become clogged or swollen. Paranasal sinuses are air pockets within the bones of the face. Sinus headaches can range from mild to severe. What are the causes? A sinus headache can result from various conditions that affect the sinuses, such as:  Colds.  Sinus infections.  Allergies.  What are the signs or symptoms? The main symptom of this condition is a headache that may feel like pain or pressure in the face, forehead, ears, or upper teeth. People who have a sinus headache often have other symptoms, such as:  Congested or runny nose.  Fever.  Inability to smell.  Weather changes can make symptoms worse. How is this diagnosed? This condition may be diagnosed based on:  A physical exam and medical history.  Imaging tests, such as a CT scan and MRI, to check for problems with the sinuses.  A specialist may look into the sinuses with a tool that has a camera (endoscopy).  How is this treated? Treatment for this condition depends on the cause.  Sinus pain that is caused by a sinus infection may be treated with antibiotic medicine.  Sinus pain that is caused by allergies may be helped by allergy medicines (antihistamines) and medicated nasal sprays.  Sinus pain that is caused by congestion may be helped by flushing the nose and sinuses with saline solution.  Follow these instructions at home:  Take medicines only as directed by your health care provider.  If you were prescribed an antibiotic medicine, finish all of it even if you start to feel better.  If you have congestion, use a nasal spray to help reduce pressure.  If directed, apply a warm, moist washcloth to your face to help relieve pain. Contact a health care provider if:  You have headaches more than one time each week.  You have sensitivity to light or sound.  You have a fever.  You feel sick to your stomach (nauseous) or you throw up  (vomit).  Your headaches do not get better with treatment. Many people think that they have a sinus headache when they actually have migraines or tension headaches. Get help right away if:  You have vision problems.  You have sudden, severe pain in your face or head.  You have a seizure.  You are confused.  You have a stiff neck. This information is not intended to replace advice given to you by your health care provider. Make sure you discuss any questions you have with your health care provider. Document Released: 08/03/2004 Document Revised: 02/20/2016 Document Reviewed: 06/22/2014 Elsevier Interactive Patient Education  2018 [employer].   Sinusitis, Adult Sinusitis is soreness and inflammation of your sinuses. Sinuses are hollow spaces in the bones around your face. They are located:  Around your eyes.  In the middle of your forehead.  Behind your nose.  In your cheekbones.  Your sinuses and nasal passages are lined with a stringy fluid (mucus). Mucus normally drains out of your sinuses. When your nasal tissues get inflamed or swollen, the mucus can get trapped or blocked so air cannot flow through your sinuses. This lets bacteria, viruses, and funguses grow, and that leads to infection. Follow these instructions at home: Medicines  Take, use, or apply over-the-counter and prescription medicines only as told by your doctor. These may include nasal sprays.  If you were prescribed an antibiotic medicine, take it as told by your doctor. Do not stop taking the antibiotic even if  you start to feel better. Hydrate and Humidify  Drink enough water to keep your pee (urine) clear or pale yellow.  Use a cool mist humidifier to keep the humidity level in your home above 50%.  Breathe in steam for 10-15 minutes, 3-4 times a day or as told by your doctor. You can do this in the bathroom while a hot shower is running.  Try not to spend time in cool or dry air. Rest  Rest as  much as possible.  Sleep with your head raised (elevated).  Make sure to get enough sleep each night. General instructions  Put a warm, moist washcloth on your face 3-4 times a day or as told by your doctor. This will help with discomfort.  Wash your hands often with soap and water. If there is no soap and water, use hand sanitizer.  Do not smoke. Avoid being around people who are smoking (secondhand smoke).  Keep all follow-up visits as told by your doctor. This is important. Contact a doctor if:  You have a fever.  Your symptoms get worse.  Your symptoms do not get better within 10 days. Get help right away if:  You have a very bad headache.  You cannot stop throwing up (vomiting).  You have pain or swelling around your face or eyes.  You have trouble seeing.  You feel confused.  Your neck is stiff.  You have trouble breathing. This information is not intended to replace advice given to you by your health care provider. Make sure you discuss any questions you have with your health care provider. Document Released: 12/13/2007 Document Revised: 02/20/2016 Document Reviewed: 04/21/2015 Elsevier Interactive Patient Education  2018 [place] twice a day for 10 days. Nasal sprays as directed.  Increase fluids/rest/vit c. If symptoms persist after antibiotics completed, then please call clinic.

## 2017-03-21 ENCOUNTER — Encounter: Payer: Self-pay | Admitting: Adult Health

## 2017-07-26 ENCOUNTER — Encounter: Payer: Self-pay | Admitting: Adult Health

## 2017-11-05 ENCOUNTER — Encounter: Payer: BC Managed Care – PPO | Admitting: Adult Health

## 2017-11-14 ENCOUNTER — Encounter: Payer: Self-pay | Admitting: Adult Health

## 2017-11-15 ENCOUNTER — Encounter: Payer: Self-pay | Admitting: Adult Health

## 2018-01-02 ENCOUNTER — Encounter: Payer: BC Managed Care – PPO | Admitting: Adult Health

## 2018-01-25 ENCOUNTER — Other Ambulatory Visit: Payer: Self-pay | Admitting: Adult Health

## 2018-01-25 DIAGNOSIS — Z1231 Encounter for screening mammogram for malignant neoplasm of breast: Secondary | ICD-10-CM

## 2018-02-05 ENCOUNTER — Encounter: Payer: Self-pay | Admitting: Adult Health

## 2018-02-05 ENCOUNTER — Other Ambulatory Visit (HOSPITAL_COMMUNITY)
Admission: RE | Admit: 2018-02-05 | Discharge: 2018-02-05 | Disposition: A | Discharge status: Home / Self Care | Payer: BC Managed Care – PPO | Source: Ambulatory Visit | Attending: Adult Health | Admitting: Adult Health

## 2018-02-05 ENCOUNTER — Ambulatory Visit (INDEPENDENT_AMBULATORY_CARE_PROVIDER_SITE_OTHER): Payer: BC Managed Care – PPO | Admitting: Adult Health

## 2018-02-05 VITALS — BP 122/74 | HR 52 | Ht 65.75 in | Wt 198.5 lb

## 2018-02-05 DIAGNOSIS — E669 Obesity, unspecified: Secondary | ICD-10-CM | POA: Diagnosis not present

## 2018-02-05 DIAGNOSIS — Z124 Encounter for screening for malignant neoplasm of cervix: Secondary | ICD-10-CM | POA: Insufficient documentation

## 2018-02-05 DIAGNOSIS — Z0000 Encounter for general adult medical examination without abnormal findings: Secondary | ICD-10-CM | POA: Diagnosis not present

## 2018-02-05 DIAGNOSIS — Z23 Encounter for immunization: Secondary | ICD-10-CM | POA: Diagnosis not present

## 2018-02-05 MED ORDER — AZELASTINE HCL 0.1 % NA SOLN: 2.0000 | NASAL | 3 refills | Status: DC | PRN

## 2018-02-05 MED ORDER — FLUTICASONE PROPIONATE 50 MCG/ACT NA SUSP: 2.0000 | NASAL | 3 refills | Status: DC | PRN

## 2018-02-05 NOTE — Patient Instructions (Signed)
Preventive Care for Adults, Female  A healthy lifestyle and preventive care can promote health and wellness. Preventive health guidelines for women include the following key practices.   A routine yearly physical is a good way to check with your health care provider about your health and preventive screening. It is a chance to share any concerns and updates on your health and to receive a thorough exam.   Visit your dentist for a routine exam and preventive care every 6 months. Brush your teeth twice a day and floss once a day. Good oral hygiene prevents tooth decay and gum disease.   The frequency of eye exams is based on your age, health, family medical history, use of contact lenses, and other factors. Follow your health care provider's recommendations for frequency of eye exams.   Eat a healthy diet. Foods like vegetables, fruits, whole grains, low-fat dairy products, and lean protein foods contain the nutrients you need without too many calories. Decrease your intake of foods high in solid fats, added sugars, and salt. Eat the right amount of calories for you.Get information about a proper diet from your health care provider, if necessary.   Regular physical exercise is one of the most important things you can do for your health. Most adults should get at least 150 minutes of moderate-intensity exercise (any activity that increases your heart rate and causes you to sweat) each week. In addition, most adults need muscle-strengthening exercises on 2 or more days a week.   Maintain a healthy weight. The body mass index (BMI) is a screening tool to identify possible weight problems. It provides an estimate of body fat based on height and weight. Your health care provider can find your BMI, and can help you achieve or maintain a healthy weight.For adults 20 years and older:   - A BMI below 18.5 is considered underweight.   - A BMI of 18.5 to 24.9 is normal.   - A BMI of 25 to 29.9 is  considered overweight.   - A BMI of 30 and above is considered obese.   Maintain normal blood lipids and cholesterol levels by exercising and minimizing your intake of trans and saturated fats.  Eat a balanced diet with plenty of fruit and vegetables. Blood tests for lipids and cholesterol should begin at age 20 and be repeated every 5 years minimum.  If your lipid or cholesterol levels are high, you are over 40, or you are at high risk for heart disease, you may need your cholesterol levels checked more frequently.Ongoing high lipid and cholesterol levels should be treated with medicines if diet and exercise are not working.   If you smoke, find out from your health care provider how to quit. If you do not use tobacco, do not start.   Lung cancer screening is recommended for adults aged 55-80 years who are at high risk for developing lung cancer because of a history of smoking. A yearly low-dose CT scan of the lungs is recommended for people who have at least a 30-pack-year history of smoking and are a current smoker or have quit within the past 15 years. A pack year of smoking is smoking an average of 1 pack of cigarettes a day for 1 year (for example: 1 pack a day for 30 years or 2 packs a day for 15 years). Yearly screening should continue until the smoker has stopped smoking for at least 15 years. Yearly screening should be stopped for people who develop a   health problem that would prevent them from having lung cancer treatment.   If you are pregnant, do not drink alcohol. If you are breastfeeding, be very cautious about drinking alcohol. If you are not pregnant and choose to drink alcohol, do not have more than 1 drink per day. One drink is considered to be 12 ounces (355 mL) of beer, 5 ounces (148 mL) of wine, or 1.5 ounces (44 mL) of liquor.   Avoid use of street drugs. Do not share needles with anyone. Ask for help if you need support or instructions about stopping the use of  drugs.   High blood pressure causes heart disease and increases the risk of stroke. Your blood pressure should be checked at least yearly.  Ongoing high blood pressure should be treated with medicines if weight loss and exercise do not work.   If you are 69-55 years old, ask your health care provider if you should take aspirin to prevent strokes.   Diabetes screening involves taking a blood sample to check your fasting blood sugar level. This should be done once every 3 years, after age 38, if you are within normal weight and without risk factors for diabetes. Testing should be considered at a younger age or be carried out more frequently if you are overweight and have at least 1 risk factor for diabetes.   Breast cancer screening is essential preventive care for women. You should practice "breast self-awareness."  This means understanding the normal appearance and feel of your breasts and may include breast self-examination.  Any changes detected, no matter how small, should be reported to a health care provider.  Women in their 80s and 30s should have a clinical breast exam (CBE) by a health care provider as part of a regular health exam every 1 to 3 years.  After age 66, women should have a CBE every year.  Starting at age 1, women should consider having a mammogram (breast X-ray test) every year.  Women who have a family history of breast cancer should talk to their health care provider about genetic screening.  Women at a high risk of breast cancer should talk to their health care providers about having an MRI and a mammogram every year.   -Breast cancer gene (BRCA)-related cancer risk assessment is recommended for women who have family members with BRCA-related cancers. BRCA-related cancers include breast, ovarian, tubal, and peritoneal cancers. Having family members with these cancers may be associated with an increased risk for harmful changes (mutations) in the breast cancer genes BRCA1 and  BRCA2. Results of the assessment will determine the need for genetic counseling and BRCA1 and BRCA2 testing.   The Pap test is a screening test for cervical cancer. A Pap test can show cell changes on the cervix that might become cervical cancer if left untreated. A Pap test is a procedure in which cells are obtained and examined from the lower end of the uterus (cervix).   - Women should have a Pap test starting at age 57.   - Between ages 90 and 70, Pap tests should be repeated every 2 years.   - Beginning at age 63, you should have a Pap test every 3 years as long as the past 3 Pap tests have been normal.   - Some women have medical problems that increase the chance of getting cervical cancer. Talk to your health care provider about these problems. It is especially important to talk to your health care provider if a  new problem develops soon after your last Pap test. In these cases, your health care provider may recommend more frequent screening and Pap tests.   - The above recommendations are the same for women who have or have not gotten the vaccine for human papillomavirus (HPV).   - If you had a hysterectomy for a problem that was not cancer or a condition that could lead to cancer, then you no longer need Pap tests. Even if you no longer need a Pap test, a regular exam is a good idea to make sure no other problems are starting.   - If you are between ages 36 and 66 years, and you have had normal Pap tests going back 10 years, you no longer need Pap tests. Even if you no longer need a Pap test, a regular exam is a good idea to make sure no other problems are starting.   - If you have had past treatment for cervical cancer or a condition that could lead to cancer, you need Pap tests and screening for cancer for at least 20 years after your treatment.   - If Pap tests have been discontinued, risk factors (such as a new sexual partner) need to be reassessed to determine if screening should  be resumed.   - The HPV test is an additional test that may be used for cervical cancer screening. The HPV test looks for the virus that can cause the cell changes on the cervix. The cells collected during the Pap test can be tested for HPV. The HPV test could be used to screen women aged 70 years and older, and should be used in women of any age who have unclear Pap test results. After the age of 67, women should have HPV testing at the same frequency as a Pap test.   Colorectal cancer can be detected and often prevented. Most routine colorectal cancer screening begins at the age of 57 years and continues through age 26 years. However, your health care provider may recommend screening at an earlier age if you have risk factors for colon cancer. On a yearly basis, your health care provider may provide home test kits to check for hidden blood in the stool.  Use of a small camera at the end of a tube, to directly examine the colon (sigmoidoscopy or colonoscopy), can detect the earliest forms of colorectal cancer. Talk to your health care provider about this at age 23, when routine screening begins. Direct exam of the colon should be repeated every 5 -10 years through age 49 years, unless early forms of pre-cancerous polyps or small growths are found.   People who are at an increased risk for hepatitis B should be screened for this virus. You are considered at high risk for hepatitis B if:  -You were born in a country where hepatitis B occurs often. Talk with your health care provider about which countries are considered high risk.  - Your parents were born in a high-risk country and you have not received a shot to protect against hepatitis B (hepatitis B vaccine).  - You have HIV or AIDS.  - You use needles to inject street drugs.  - You live with, or have sex with, someone who has Hepatitis B.  - You get hemodialysis treatment.  - You take certain medicines for conditions like cancer, organ  transplantation, and autoimmune conditions.   Hepatitis C blood testing is recommended for all people born from [birth year] through 1965 and any individual  with known risks for hepatitis C.   Practice safe sex. Use condoms and avoid high-risk sexual practices to reduce the spread of sexually transmitted infections (STIs). STIs include gonorrhea, chlamydia, syphilis, trichomonas, herpes, HPV, and human immunodeficiency virus (HIV). Herpes, HIV, and HPV are viral illnesses that have no cure. They can result in disability, cancer, and death. Sexually active women aged 25 years and younger should be checked for chlamydia. Older women with new or multiple partners should also be tested for chlamydia. Testing for other STIs is recommended if you are sexually active and at increased risk.   Osteoporosis is a disease in which the bones lose minerals and strength with aging. This can result in serious bone fractures or breaks. The risk of osteoporosis can be identified using a bone density scan. Women ages 65 years and over and women at risk for fractures or osteoporosis should discuss screening with their health care providers. Ask your health care provider whether you should take a calcium supplement or vitamin D to There are also several preventive steps women can take to avoid osteoporosis and resulting fractures or to keep osteoporosis from worsening. -->Recommendations include:  Eat a balanced diet high in fruits, vegetables, calcium, and vitamins.  Get enough calcium. The recommended total intake of is 1,200 mg daily; for best absorption, if taking supplements, divide doses into 250-500 mg doses throughout the day. Of the two types of calcium, calcium carbonate is best absorbed when taken with food but calcium citrate can be taken on an empty stomach.  Get enough vitamin D. NAMS and the National Osteoporosis Foundation recommend at least 1,000 IU per day for women age 50 and over who are at risk of vitamin D  deficiency. Vitamin D deficiency can be caused by inadequate sun exposure (for example, those who live in northern latitudes).  Avoid alcohol and smoking. Heavy alcohol intake (more than 7 drinks per week) increases the risk of falls and hip fracture and women smokers tend to lose bone more rapidly and have lower bone mass than nonsmokers. Stopping smoking is one of the most important changes women can make to improve their health and decrease risk for disease.  Be physically active every day. Weight-bearing exercise (for example, fast walking, hiking, jogging, and weight training) may strengthen bones or slow the rate of bone loss that comes with aging. Balancing and muscle-strengthening exercises can reduce the risk of falling and fracture.  Consider therapeutic medications. Currently, several types of effective drugs are available. Healthcare providers can recommend the type most appropriate for each woman.  Eliminate environmental factors that may contribute to accidents. Falls cause nearly 90% of all osteoporotic fractures, so reducing this risk is an important bone-health strategy. Measures include ample lighting, removing obstructions to walking, using nonskid rugs on floors, and placing mats and/or grab bars in showers.  Be aware of medication side effects. Some common medicines make bones weaker. These include a type of steroid drug called glucocorticoids used for arthritis and asthma, some antiseizure drugs, certain sleeping pills, treatments for endometriosis, and some cancer drugs. An overactive thyroid gland or using too much thyroid hormone for an underactive thyroid can also be a problem. If you are taking these medicines, talk to your doctor about what you can do to help protect your bones.reduce the rate of osteoporosis.    Menopause can be associated with physical symptoms and risks. Hormone replacement therapy is available to decrease symptoms and risks. You should talk to your  health care provider   about whether hormone replacement therapy is right for you.   Use sunscreen. Apply sunscreen liberally and repeatedly throughout the day. You should seek shade when your shadow is shorter than you. Protect yourself by wearing long sleeves, pants, a wide-brimmed hat, and sunglasses year round, whenever you are outdoors.   Once a month, do a whole body skin exam, using a mirror to look at the skin on your back. Tell your health care provider of new moles, moles that have irregular borders, moles that are larger than a pencil eraser, or moles that have changed in shape or color.   -Stay current with required vaccines (immunizations).   Influenza vaccine. All adults should be immunized every year.  Tetanus, diphtheria, and acellular pertussis (Td, Tdap) vaccine. Pregnant women should receive 1 dose of Tdap vaccine during each pregnancy. The dose should be obtained regardless of the length of time since the last dose. Immunization is preferred during the 27th 36th week of gestation. An adult who has not previously received Tdap or who does not know her vaccine status should receive 1 dose of Tdap. This initial dose should be followed by tetanus and diphtheria toxoids (Td) booster doses every 10 years. Adults with an unknown or incomplete history of completing a 3-dose immunization series with Td-containing vaccines should begin or complete a primary immunization series including a Tdap dose. Adults should receive a Td booster every 10 years.  Varicella vaccine. An adult without evidence of immunity to varicella should receive 2 doses or a second dose if she has previously received 1 dose. Pregnant females who do not have evidence of immunity should receive the first dose after pregnancy. This first dose should be obtained before leaving the health care facility. The second dose should be obtained 4 8 weeks after the first dose.  Human papillomavirus (HPV) vaccine. Females aged 13 26  years who have not received the vaccine previously should obtain the 3-dose series. The vaccine is not recommended for use in pregnant females. However, pregnancy testing is not needed before receiving a dose. If a female is found to be pregnant after receiving a dose, no treatment is needed. In that case, the remaining doses should be delayed until after the pregnancy. Immunization is recommended for any person with an immunocompromised condition through the age of 26 years if she did not get any or all doses earlier. During the 3-dose series, the second dose should be obtained 4 8 weeks after the first dose. The third dose should be obtained 24 weeks after the first dose and 16 weeks after the second dose.  Zoster vaccine. One dose is recommended for adults aged 60 years or older unless certain conditions are present.  Measles, mumps, and rubella (MMR) vaccine. Adults born before 1957 generally are considered immune to measles and mumps. Adults born in 1957 or later should have 1 or more doses of MMR vaccine unless there is a contraindication to the vaccine or there is laboratory evidence of immunity to each of the three diseases. A routine second dose of MMR vaccine should be obtained at least 28 days after the first dose for students attending postsecondary schools, health care workers, or international travelers. People who received inactivated measles vaccine or an unknown type of measles vaccine during 1963 1967 should receive 2 doses of MMR vaccine. People who received inactivated mumps vaccine or an unknown type of mumps vaccine before 1979 and are at high risk for mumps infection should consider immunization with 2 doses of   MMR vaccine. For females of childbearing age, rubella immunity should be determined. If there is no evidence of immunity, females who are not pregnant should be vaccinated. If there is no evidence of immunity, females who are pregnant should delay immunization until after pregnancy.  Unvaccinated health care workers born before [birth year] who lack laboratory evidence of measles, mumps, or rubella immunity or laboratory confirmation of disease should consider measles and mumps immunization with 2 doses of MMR vaccine or rubella immunization with 1 dose of MMR vaccine.  Pneumococcal 13-valent conjugate (PCV13) vaccine. When indicated, a person who is uncertain of her immunization history and has no record of immunization should receive the PCV13 vaccine. An adult aged 54 years or older who has certain medical conditions and has not been previously immunized should receive 1 dose of PCV13 vaccine. This PCV13 should be followed with a dose of pneumococcal polysaccharide (PPSV23) vaccine. The PPSV23 vaccine dose should be obtained at least 8 weeks after the dose of PCV13 vaccine. An adult aged 58 years or older who has certain medical conditions and previously received 1 or more doses of PPSV23 vaccine should receive 1 dose of PCV13. The PCV13 vaccine dose should be obtained 1 or more years after the last PPSV23 vaccine dose.  Pneumococcal polysaccharide (PPSV23) vaccine. When PCV13 is also indicated, PCV13 should be obtained first. All adults aged 58 years and older should be immunized. An adult younger than age 65 years who has certain medical conditions should be immunized. Any person who resides in a nursing home or long-term care facility should be immunized. An adult smoker should be immunized. People with an immunocompromised condition and certain other conditions should receive both PCV13 and PPSV23 vaccines. People with human immunodeficiency virus (HIV) infection should be immunized as soon as possible after diagnosis. Immunization during chemotherapy or radiation therapy should be avoided. Routine use of PPSV23 vaccine is not recommended for American Indians, [place] Natives, or people younger than 65 years unless there are medical conditions that require PPSV23 vaccine. When indicated,  people who have unknown immunization and have no record of immunization should receive PPSV23 vaccine. One-time revaccination 5 years after the first dose of PPSV23 is recommended for people aged 70 64 years who have chronic kidney failure, nephrotic syndrome, asplenia, or immunocompromised conditions. People who received 1 2 doses of PPSV23 before age 32 years should receive another dose of PPSV23 vaccine at age 96 years or later if at least 5 years have passed since the previous dose. Doses of PPSV23 are not needed for people immunized with PPSV23 at or after age 55 years.  Meningococcal vaccine. Adults with asplenia or persistent complement component deficiencies should receive 2 doses of quadrivalent meningococcal conjugate (MenACWY-D) vaccine. The doses should be obtained at least 2 months apart. Microbiologists working with certain meningococcal bacteria, [employer] recruits, people at risk during an outbreak, and people who travel to or live in countries with a high rate of meningitis should be immunized. A first-year college student up through age 58 years who is living in a residence hall should receive a dose if she did not receive a dose on or after her 16th birthday. Adults who have certain high-risk conditions should receive one or more doses of vaccine.  Hepatitis A vaccine. Adults who wish to be protected from this disease, have certain high-risk conditions, work with hepatitis A-infected animals, work in hepatitis A research labs, or travel to or work in countries with a high rate of hepatitis A should be  immunized. Adults who were previously unvaccinated and who anticipate close contact with an international adoptee during the first 60 days after arrival in the [place] from a country with a high rate of hepatitis A should be immunized.  Hepatitis B vaccine.  Adults who wish to be protected from this disease, have certain high-risk conditions, may be exposed to blood or other infectious  body fluids, are household contacts or sex partners of hepatitis B positive people, are clients or workers in certain care facilities, or travel to or work in countries with a high rate of hepatitis B should be immunized.  Haemophilus influenzae type b (Hib) vaccine. A previously unvaccinated person with asplenia or sickle cell disease or having a scheduled splenectomy should receive 1 dose of Hib vaccine. Regardless of previous immunization, a recipient of a hematopoietic stem cell transplant should receive a 3-dose series 6 12 months after her successful transplant. Hib vaccine is not recommended for adults with HIV infection.  Preventive Services / Frequency Ages 6 to 39years  Blood pressure check.** / Every 1 to 2 years.  Lipid and cholesterol check.** / Every 5 years beginning at age 39.  Clinical breast exam.** / Every 3 years for women in their 61s and 62s.  BRCA-related cancer risk assessment.** / For women who have family members with a BRCA-related cancer (breast, ovarian, tubal, or peritoneal cancers).  Pap test.** / Every 2 years from ages 47 through 85. Every 3 years starting at age 34 through age 12 or 74 with a history of 3 consecutive normal Pap tests.  HPV screening.** / Every 3 years from ages 46 through ages 43 to 54 with a history of 3 consecutive normal Pap tests.  Hepatitis C blood test.** / For any individual with known risks for hepatitis C.  Skin self-exam. / Monthly.  Influenza vaccine. / Every year.  Tetanus, diphtheria, and acellular pertussis (Tdap, Td) vaccine.** / Consult your health care provider. Pregnant women should receive 1 dose of Tdap vaccine during each pregnancy. 1 dose of Td every 10 years.  Varicella vaccine.** / Consult your health care provider. Pregnant females who do not have evidence of immunity should receive the first dose after pregnancy.  HPV vaccine. / 3 doses over 6 months, if 64 and younger. The vaccine is not recommended for use in  pregnant females. However, pregnancy testing is not needed before receiving a dose.  Measles, mumps, rubella (MMR) vaccine.** / You need at least 1 dose of MMR if you were born in 1957 or later. You may also need a 2nd dose. For females of childbearing age, rubella immunity should be determined. If there is no evidence of immunity, females who are not pregnant should be vaccinated. If there is no evidence of immunity, females who are pregnant should delay immunization until after pregnancy.  Pneumococcal 13-valent conjugate (PCV13) vaccine.** / Consult your health care provider.  Pneumococcal polysaccharide (PPSV23) vaccine.** / 1 to 2 doses if you smoke cigarettes or if you have certain conditions.  Meningococcal vaccine.** / 1 dose if you are age 71 to 35 years and a Market researcher living in a residence hall, or have one of several medical conditions, you need to get vaccinated against meningococcal disease. You may also need additional booster doses.  Hepatitis A vaccine.** / Consult your health care provider.  Hepatitis B vaccine.** / Consult your health care provider.  Haemophilus influenzae type b (Hib) vaccine.** / Consult your health care provider.  Ages 55 to 64years  Blood pressure check.** / Every 1 to 2 years.  Lipid and cholesterol check.** / Every 5 years beginning at age 20 years.  Lung cancer screening. / Every year if you are aged 55 80 years and have a 30-pack-year history of smoking and currently smoke or have quit within the past 15 years. Yearly screening is stopped once you have quit smoking for at least 15 years or develop a health problem that would prevent you from having lung cancer treatment.  Clinical breast exam.** / Every year after age 40 years.  BRCA-related cancer risk assessment.** / For women who have family members with a BRCA-related cancer (breast, ovarian, tubal, or peritoneal cancers).  Mammogram.** / Every year beginning at age 40  years and continuing for as long as you are in good health. Consult with your health care provider.  Pap test.** / Every 3 years starting at age 30 years through age 65 or 70 years with a history of 3 consecutive normal Pap tests.  HPV screening.** / Every 3 years from ages 30 years through ages 65 to 70 years with a history of 3 consecutive normal Pap tests.  Fecal occult blood test (FOBT) of stool. / Every year beginning at age 50 years and continuing until age 75 years. You may not need to do this test if you get a colonoscopy every 10 years.  Flexible sigmoidoscopy or colonoscopy.** / Every 5 years for a flexible sigmoidoscopy or every 10 years for a colonoscopy beginning at age 50 years and continuing until age 75 years.  Hepatitis C blood test.** / For all people born from 1945 through 1965 and any individual with known risks for hepatitis C.  Skin self-exam. / Monthly.  Influenza vaccine. / Every year.  Tetanus, diphtheria, and acellular pertussis (Tdap/Td) vaccine.** / Consult your health care provider. Pregnant women should receive 1 dose of Tdap vaccine during each pregnancy. 1 dose of Td every 10 years.  Varicella vaccine.** / Consult your health care provider. Pregnant females who do not have evidence of immunity should receive the first dose after pregnancy.  Zoster vaccine.** / 1 dose for adults aged 60 years or older.  Measles, mumps, rubella (MMR) vaccine.** / You need at least 1 dose of MMR if you were born in 1957 or later. You may also need a 2nd dose. For females of childbearing age, rubella immunity should be determined. If there is no evidence of immunity, females who are not pregnant should be vaccinated. If there is no evidence of immunity, females who are pregnant should delay immunization until after pregnancy.  Pneumococcal 13-valent conjugate (PCV13) vaccine.** / Consult your health care provider.  Pneumococcal polysaccharide (PPSV23) vaccine.** / 1 to 2 doses if  you smoke cigarettes or if you have certain conditions.  Meningococcal vaccine.** / Consult your health care provider.  Hepatitis A vaccine.** / Consult your health care provider.  Hepatitis B vaccine.** / Consult your health care provider.  Haemophilus influenzae type b (Hib) vaccine.** / Consult your health care provider.  Ages 65 years and over  Blood pressure check.** / Every 1 to 2 years.  Lipid and cholesterol check.** / Every 5 years beginning at age 20 years.  Lung cancer screening. / Every year if you are aged 55 80 years and have a 30-pack-year history of smoking and currently smoke or have quit within the past 15 years. Yearly screening is stopped once you have quit smoking for at least 15 years or develop a health problem that   would prevent you from having lung cancer treatment.  Clinical breast exam.** / Every year after age 103 years.  BRCA-related cancer risk assessment.** / For women who have family members with a BRCA-related cancer (breast, ovarian, tubal, or peritoneal cancers).  Mammogram.** / Every year beginning at age 36 years and continuing for as long as you are in good health. Consult with your health care provider.  Pap test.** / Every 3 years starting at age 5 years through age 85 or 10 years with 3 consecutive normal Pap tests. Testing can be stopped between 65 and 70 years with 3 consecutive normal Pap tests and no abnormal Pap or HPV tests in the past 10 years.  HPV screening.** / Every 3 years from ages 93 years through ages 70 or 45 years with a history of 3 consecutive normal Pap tests. Testing can be stopped between 65 and 70 years with 3 consecutive normal Pap tests and no abnormal Pap or HPV tests in the past 10 years.  Fecal occult blood test (FOBT) of stool. / Every year beginning at age 8 years and continuing until age 45 years. You may not need to do this test if you get a colonoscopy every 10 years.  Flexible sigmoidoscopy or colonoscopy.** /  Every 5 years for a flexible sigmoidoscopy or every 10 years for a colonoscopy beginning at age 69 years and continuing until age 68 years.  Hepatitis C blood test.** / For all people born from [birth year] through 1967 and any individual with known risks for hepatitis C.  Osteoporosis screening.** / A one-time screening for women ages 7 years and over and women at risk for fractures or osteoporosis.  Skin self-exam. / Monthly.  Influenza vaccine. / Every year.  Tetanus, diphtheria, and acellular pertussis (Tdap/Td) vaccine.** / 1 dose of Td every 10 years.  Varicella vaccine.** / Consult your health care provider.  Zoster vaccine.** / 1 dose for adults aged 5 years or older.  Pneumococcal 13-valent conjugate (PCV13) vaccine.** / Consult your health care provider.  Pneumococcal polysaccharide (PPSV23) vaccine.** / 1 dose for all adults aged 74 years and older.  Meningococcal vaccine.** / Consult your health care provider.  Hepatitis A vaccine.** / Consult your health care provider.  Hepatitis B vaccine.** / Consult your health care provider.  Haemophilus influenzae type b (Hib) vaccine.** / Consult your health care provider. ** Family history and personal history of risk and conditions may change your health care provider's recommendations. Document Released: 08/22/2001 Document Revised: 04/16/2013  [facility] Patient Information 2014 [place], [place].   EXERCISE AND DIET:  We recommended that you start or continue a regular exercise program for good health. Regular exercise means any activity that makes your heart beat faster and makes you sweat.  We recommend exercising at least 30 minutes per day at least 3 days a week, preferably 5.  We also recommend a diet low in fat and sugar / carbohydrates.  Inactivity, poor dietary choices and obesity can cause diabetes, heart attack, stroke, and kidney damage, among others.     ALCOHOL AND SMOKING:  Women should limit their alcohol intake to no  more than 7 drinks/beers/glasses of wine (combined, not each!) per week. Moderation of alcohol intake to this level decreases your risk of breast cancer and liver damage.  ( And of course, no recreational drugs are part of a healthy lifestyle.)  Also, you should not be smoking at all or even being exposed to second hand smoke. Most people know smoking can  cause cancer, and various heart and lung diseases, but did you know it also contributes to weakening of your bones?  Aging of your skin?  Yellowing of your teeth and nails?   CALCIUM AND VITAMIN D:  Adequate intake of calcium and Vitamin D are recommended.  The recommendations for exact amounts of these supplements seem to change often, but generally speaking 600 mg of calcium (either carbonate or citrate) and 800 units of Vitamin D per day seems prudent. Certain women may benefit from higher intake of Vitamin D.  If you are among these women, your doctor will have told you during your visit.     PAP SMEARS:  Pap smears, to check for cervical cancer or precancers,  have traditionally been done yearly, although recent scientific advances have shown that most women can have pap smears less often.  However, every woman still should have a physical exam from her gynecologist or primary care physician every year. It will include a breast check, inspection of the vulva and vagina to check for abnormal growths or skin changes, a visual exam of the cervix, and then an exam to evaluate the size and shape of the uterus and ovaries.  And after 49 years of age, a rectal exam is indicated to check for rectal cancers. We will also provide age appropriate advice regarding health maintenance, like when you should have certain vaccines, screening for sexually transmitted diseases, bone density testing, colonoscopy, mammograms, etc.    MAMMOGRAMS:  All women over 65 years old should have a yearly mammogram. Many facilities now offer a "3D" mammogram, which may cost  around $50 extra out of pocket. If possible,  we recommend you accept the option to have the 3D mammogram performed.  It both reduces the number of women who will be called back for extra views which then turn out to be normal, and it is better than the routine mammogram at detecting truly abnormal areas.     COLONOSCOPY:  Colonoscopy to screen for colon cancer is recommended for all women at age 26.  We know, you hate the idea of the prep.  We agree, BUT, having colon cancer and not knowing it is worse!!  Colon cancer so often starts as a polyp that can be seen and removed at colonscopy, which can quite literally save your life!  And if your first colonoscopy is normal and you have no family history of colon cancer, most women don't have to have it again for 10 years.  Once every ten years, you can do something that may end up saving your life, right?  We will be happy to help you get it scheduled when you are ready.  Be sure to check your insurance coverage so you understand how much it will cost.  It may be covered as a preventative service at no cost, but you should check your particular policy.    Mediterranean Diet A Mediterranean diet refers to food and lifestyle choices that are based on the traditions of countries located on the [employer]. This way of eating has been shown to help prevent certain conditions and improve outcomes for people who have chronic diseases, like kidney disease and heart disease. What are tips for following this plan? Lifestyle  Cook and eat meals together with your family, when possible.  Drink enough fluid to keep your urine clear or pale yellow.  Be physically active every day. This includes: ? Aerobic exercise like running or swimming. ? Leisure activities like  gardening, walking, or housework.  Get 7-8 hours of sleep each night.  If recommended by your health care provider, drink red wine in moderation. This means 1 glass a day for nonpregnant women  and 2 glasses a day for men. A glass of wine equals 5 oz (150 mL). Reading food labels  Check the serving size of packaged foods. For foods such as rice and pasta, the serving size refers to the amount of cooked product, not dry.  Check the total fat in packaged foods. Avoid foods that have saturated fat or trans fats.  Check the ingredients list for added sugars, such as corn syrup. Shopping  At the grocery store, buy most of your food from the areas near the walls of the store. This includes: ? Fresh fruits and vegetables (produce). ? Grains, beans, nuts, and seeds. Some of these may be available in unpackaged forms or large amounts (in bulk). ? Fresh seafood. ? Poultry and eggs. ? Low-fat dairy products.  Buy whole ingredients instead of prepackaged foods.  Buy fresh fruits and vegetables in-season from local farmers markets.  Buy frozen fruits and vegetables in resealable bags.  If you do not have access to quality fresh seafood, buy precooked frozen shrimp or canned fish, such as tuna, salmon, or sardines.  Buy small amounts of raw or cooked vegetables, salads, or olives from the deli or salad bar at your store.  Stock your pantry so you always have certain foods on hand, such as olive oil, canned tuna, canned tomatoes, rice, pasta, and beans. Cooking  Cook foods with extra-virgin olive oil instead of using butter or other vegetable oils.  Have meat as a side dish, and have vegetables or grains as your main dish. This means having meat in small portions or adding small amounts of meat to foods like pasta or stew.  Use beans or vegetables instead of meat in common dishes like chili or lasagna.  Experiment with different cooking methods. Try roasting or broiling vegetables instead of steaming or sauteing them.  Add frozen vegetables to soups, stews, pasta, or rice.  Add nuts or seeds for added healthy fat at each meal. You can add these to yogurt, salads, or vegetable  dishes.  Marinate fish or vegetables using olive oil, lemon juice, garlic, and fresh herbs. Meal planning  Plan to eat 1 vegetarian meal one day each week. Try to work up to 2 vegetarian meals, if possible.  Eat seafood 2 or more times a week.  Have healthy snacks readily available, such as: ? Vegetable sticks with hummus. ? [place] yogurt. ? Fruit and nut trail mix.  Eat balanced meals throughout the week. This includes: ? Fruit: 2-3 servings a day ? Vegetables: 4-5 servings a day ? Low-fat dairy: 2 servings a day ? Fish, poultry, or lean meat: 1 serving a day ? Beans and legumes: 2 or more servings a week ? Nuts and seeds: 1-2 servings a day ? Whole grains: 6-8 servings a day ? Extra-virgin olive oil: 3-4 servings a day  Limit red meat and sweets to only a few servings a month What are my food choices?  Mediterranean diet ? Recommended ? Grains: Whole-grain pasta. Brown rice. Bulgar wheat. Polenta. Couscous. Whole-wheat bread. [place]. ? Vegetables: Artichokes. Beets. Broccoli. Cabbage. Carrots. Eggplant. Green beans. Chard. Kale. Spinach. Onions. Leeks. Peas. Squash. Tomatoes. Peppers. Radishes. ? Fruits: Apples. Apricots. Avocado. Berries. Bananas. Cherries. Dates. Figs. Grapes. Lemons. Melon. Oranges. Peaches. Plums. Pomegranate. ? Meats and other protein  foods: Beans. Almonds. Sunflower seeds. Pine nuts. Peanuts. [place]. Salmon. Scallops. Shrimp. [place]. Tilapia. Clams. Oysters. Eggs. ? Dairy: Low-fat milk. Cheese. Greek yogurt. ? Beverages: Water. Red wine. Herbal tea. ? Fats and oils: Extra virgin olive oil. Avocado oil. Grape seed oil. ? Sweets and desserts: [place] yogurt with honey. Baked apples. Poached pears. Trail mix. ? Seasoning and other foods: Basil. Cilantro. Coriander. Cumin. Mint. Parsley. Sage. Rosemary. Tarragon. Garlic. Oregano. Thyme. Pepper. Balsalmic vinegar. Tahini. Hummus. Tomato sauce. Olives. Mushrooms. ? Limit these ? Grains: Prepackaged pasta or  rice dishes. Prepackaged cereal with added sugar. ? Vegetables: Deep fried potatoes (french fries). ? Fruits: Fruit canned in syrup. ? Meats and other protein foods: Beef. Pork. Lamb. Poultry with skin. Hot dogs. [place] Salines. ? Dairy: Ice cream. Sour cream. Whole milk. ? Beverages: Juice. Sugar-sweetened soft drinks. Beer. Liquor and spirits. ? Fats and oils: Butter. Canola oil. Vegetable oil. Beef fat (tallow). Lard. ? Sweets and desserts: Cookies. Cakes. Pies. Candy. ? Seasoning and other foods: Mayonnaise. Premade sauces and marinades. ? The items listed may not be a complete list. Talk with your dietitian about what dietary choices are right for you. Summary  The Mediterranean diet includes both food and lifestyle choices.  Eat a variety of fresh fruits and vegetables, beans, nuts, seeds, and whole grains.  Limit the amount of red meat and sweets that you eat.  Talk with your health care provider about whether it is safe for you to drink red wine in moderation. This means 1 glass a day for nonpregnant women and 2 glasses a day for men. A glass of wine equals 5 oz (150 mL). This information is not intended to replace advice given to you by your health care provider. Make sure you discuss any questions you have with your health care provider. Document Released: 02/17/2016 Document Revised: 03/21/2016 Document Reviewed: 02/17/2016 Elsevier Interactive Patient Education  2018 [employer].  Increase water intake, strive for at least 100 ounces/day.   Follow Mediterranean diet Increase regular exercise.  Recommend at least 30 minutes daily, 5 days per week of walking, jogging, biking, swimming, YouTube/Pinterest workout videos. Please schedule fasting lab appt at your convenience. We will call you when PAP results are available.  Please keep your mammogram appt. Recommend annual physical. Enjoy the rest of your summer. NICE TO SEE YOU!

## 2018-02-05 NOTE — Progress Notes (Signed)
Subjective:    Patient ID: Pamela Lin, female    DOB: Nov 19, 1970, 47 y.o.   MRN: [id number]  HPI: 10/09/16 OV:  Pamela Lin presents to establish as a new pt. She is a pleasant 47 year old woman.  PMH:  Dust Mite allergy and menopause.  She follows a vegetarian diet, drinks > 60 ounces of water daily, and achieves 8,000-11,000 steps daily.  Since menopause she has gained > 30 lbs.  Her comfortable/goal weight is 160-165#.  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   02/05/18 OV: Pamela Lin is here for CPE She denies acute complaints/changes since initial OV She has slacked off regular exercise and estimates to only walk 10-15 mins daily with the dogs She continues to follow mostly vegetarian diet She continues to avoid tobacco use and seldom drinks ETOH  She has gained >8 lbs since August 2018  Healthcare Maintenance: PAP-complete today Mammogram-scheduled for 02/14/18 Immunizations- Tdap updated today  Patient Care Team    Relationship Specialty Notifications Start End  Romi Rathel, Berna Spare, NP PCP - General Family Medicine  10/12/16     Patient Active Problem List   Diagnosis Date Noted  . Screening for cervical cancer 02/05/2018  . Sinusitis, acute frontal 02/27/2017  . Healthcare maintenance 10/12/2016  . Obesity (BMI 30-39.9) 10/12/2016  . History of environmental allergies 10/12/2016     Past Medical History:  Diagnosis Date  . Allergy      Past Surgical History:  Procedure Laterality Date  . ADENOIDECTOMY    . CHOLECYSTECTOMY       Family History  Problem Relation Age of Onset  . Cancer Mother        breast  . Breast cancer Mother   . Cancer Father        colon  . Cancer Maternal Aunt        breast  . Breast cancer Maternal Aunt   . Cancer Paternal Aunt        breast  . Breast cancer Paternal Aunt   . Cancer Maternal Grandmother        breast  . Breast cancer Maternal Grandmother   . Cancer Paternal Grandmother        breast  . Breast cancer Paternal Grandmother       Social History   Substance and Sexual Activity  Drug Use No     Social History   Substance and Sexual Activity  Alcohol Use Yes   Comment: socially     Social History   Tobacco Use  Smoking Status Never Smoker  Smokeless Tobacco Never Used     Outpatient Encounter Medications as of 02/05/2018  Medication Sig  . azelastine (ASTELIN) 0.1 % nasal spray Place 2 sprays into both nostrils as needed for rhinitis. Use in each nostril as directed  . fluticasone (FLONASE) 50 MCG/ACT nasal spray Place 2 sprays into both nostrils as needed for allergies or rhinitis.  . Multiple Vitamin (MULTIVITAMIN) tablet Take 1 tablet by mouth daily.  . [DISCONTINUED] azelastine (ASTELIN) 0.1 % nasal spray Place 2 sprays into both nostrils as needed for rhinitis. Use in each nostril as directed  . [DISCONTINUED] fluticasone (FLONASE) 50 MCG/ACT nasal spray Place 2 sprays into both nostrils as needed for allergies or rhinitis.  . [DISCONTINUED] amoxicillin-clavulanate (AUGMENTIN) 875-125 MG tablet Take 1 tablet by mouth 2 (two) times daily.  . [DISCONTINUED] Calcium Carbonate-Vitamin D3 (CALCIUM 600-D) 600-400 MG-UNIT TABS Take 2 tablets by mouth daily.  . [DISCONTINUED] TURMERIC PO Take  by mouth.   No facility-administered encounter medications on file as of 02/05/2018.     Allergies: Dust mite extract  Body mass index is 32.28 kg/m.  Blood pressure 122/74, pulse (!) 52, height 5' 5.75" (1.67 m), weight 198 lb 8 oz (90 kg), SpO2 99 %.  Review of Systems  Constitutional: Negative for activity change, appetite change, chills, diaphoresis, fatigue, fever and unexpected weight change.  HENT: Negative for congestion.   Eyes: Negative for visual disturbance.  Respiratory: Negative for cough, chest tightness, shortness of breath, wheezing and stridor.   Cardiovascular: Negative for chest pain, palpitations and leg swelling.  Gastrointestinal: Negative for abdominal distention, abdominal pain,  blood in stool, constipation, diarrhea, nausea and vomiting.  Endocrine: Negative for cold intolerance, heat intolerance, polydipsia, polyphagia and polyuria.  Genitourinary: Positive for menstrual problem. Negative for difficulty urinating and flank pain.       LMP- Fall 2016 Experiencing intermittent hot flashes and 30 lb weight gain.  Musculoskeletal: Negative for arthralgias, back pain, gait problem, joint swelling, myalgias, neck pain and neck stiffness.  Skin: Negative for color change, pallor, rash and wound.  Allergic/Immunologic: Negative for immunocompromised state.  Neurological: Negative for dizziness and headaches.  Hematological: Does not bruise/bleed easily.  Psychiatric/Behavioral: Negative for agitation, dysphoric mood and sleep disturbance. The patient is not nervous/anxious and is not hyperactive.        Objective:   Physical Exam  Constitutional: She is oriented to person, place, and time. She appears well-developed and well-nourished. No distress.  HENT:  Head: Normocephalic and atraumatic.  Right Ear: Hearing, tympanic membrane and external ear normal. Tympanic membrane is not erythematous and not bulging. No decreased hearing is noted.  Left Ear: Hearing, tympanic membrane and external ear normal. Tympanic membrane is not erythematous and not bulging. No decreased hearing is noted.  Nose: Nose normal. No mucosal edema or rhinorrhea. Right sinus exhibits no maxillary sinus tenderness and no frontal sinus tenderness. Left sinus exhibits no maxillary sinus tenderness and no frontal sinus tenderness.  Mouth/Throat: Uvula is midline, oropharynx is clear and moist and mucous membranes are normal.  Eyes: Pupils are equal, round, and reactive to light. Conjunctivae and EOM are normal.  Neck: Normal range of motion. Neck supple.  Cardiovascular: Normal rate, regular rhythm, normal heart sounds and intact distal pulses.  No murmur heard. Pulmonary/Chest: Effort normal and  breath sounds normal. No respiratory distress. She has no decreased breath sounds. She has no wheezes. She has no rhonchi. She has no rales. She exhibits no tenderness. Right breast exhibits inverted nipple. Right breast exhibits no mass, no nipple discharge, no skin change and no tenderness. Left breast exhibits no inverted nipple, no mass, no nipple discharge, no skin change and no tenderness.  Abdominal: Soft. Bowel sounds are normal. She exhibits no distension and no mass. There is no tenderness. There is no rebound and no guarding.  Genitourinary: Vagina normal. No vaginal discharge found.  Genitourinary Comments: Chaperone present during examination.  Musculoskeletal: Normal range of motion. She exhibits no edema or tenderness.  Lymphadenopathy:    She has no cervical adenopathy.  Neurological: She is alert and oriented to person, place, and time. She has normal reflexes. Coordination normal.  Skin: Skin is warm and dry. No rash noted. She is not diaphoretic. No erythema. No pallor.  Psychiatric: She has a normal mood and affect. Her behavior is normal. Judgment and thought content normal.  Nursing note and vitals reviewed.     Assessment & Plan:  1. Healthcare maintenance   2. Need for Tdap vaccination   3. Screening for cervical cancer   4. Obesity (BMI 30-39.9)     Healthcare maintenance Increase water intake, strive for at least 100 ounces/day.   Follow Mediterranean diet Increase regular exercise.  Recommend at least 30 minutes daily, 5 days per week of walking, jogging, biking, swimming, YouTube/Pinterest workout videos. Please schedule fasting lab appt at your convenience. We will call you when PAP results are available.  Please keep your mammogram appt. Recommend annual physical. Enjoy the rest of your summer.  Obesity (BMI 30-39.9) Body mass index is 32.28 kg/m.  Current wt 198 8 b wt gain since 02/2017 Increase water intake, strive for at least 100 ounces/day.    Follow Mediterranean diet Increase regular exercise.  Recommend at least 30 minutes daily, 5 days per week of walking, jogging, biking, swimming, YouTube/Pinterest workout videos.    FOLLOW-UP:  Return in about 1 year (around 02/06/2019) for CPE, Fasting Labs.

## 2018-02-05 NOTE — Assessment & Plan Note (Signed)
>>  ASSESSMENT AND PLAN FOR OBESITY (BMI 30-39.9) WRITTEN ON 02/05/2018  3:04 PM BY DANFORD, KATY D, NP  Body mass index is 32.28 kg/m.  Current wt 198 8 b wt gain since 02/2017 Increase water intake, strive for at least 100 ounces/day.   Follow Mediterranean diet Increase regular exercise.  Recommend at least 30 minutes daily, 5 days per week of walking, jogging, biking, swimming, YouTube/Pinterest workout videos.

## 2018-02-05 NOTE — Assessment & Plan Note (Signed)
Increase water intake, strive for at least 100 ounces/day.   Follow Mediterranean diet Increase regular exercise.  Recommend at least 30 minutes daily, 5 days per week of walking, jogging, biking, swimming, YouTube/Pinterest workout videos. Please schedule fasting lab appt at your convenience. We will call you when PAP results are available.  Please keep your mammogram appt. Recommend annual physical. Enjoy the rest of your summer.

## 2018-02-05 NOTE — Assessment & Plan Note (Addendum)
Body mass index is 32.28 kg/m.  Current wt 198 8 b wt gain since 02/2017 Increase water intake, strive for at least 100 ounces/day.   Follow Mediterranean diet Increase regular exercise.  Recommend at least 30 minutes daily, 5 days per week of walking, jogging, biking, swimming, YouTube/Pinterest workout videos.

## 2018-02-07 LAB — CYTOLOGY - PAP
Adequacy: ABSENT
Diagnosis: NEGATIVE

## 2018-02-08 ENCOUNTER — Other Ambulatory Visit (INDEPENDENT_AMBULATORY_CARE_PROVIDER_SITE_OTHER): Payer: BC Managed Care – PPO

## 2018-02-08 DIAGNOSIS — Z0000 Encounter for general adult medical examination without abnormal findings: Secondary | ICD-10-CM

## 2018-02-09 LAB — CBC WITH DIFFERENTIAL/PLATELET
Basophils Absolute: 0 10*3/uL (ref 0.0–0.2)
Basos: 1 %
EOS (ABSOLUTE): 0.1 10*3/uL (ref 0.0–0.4)
Eos: 2 %
Hematocrit: 40.7 % (ref 34.0–46.6)
Hemoglobin: 13.3 g/dL (ref 11.1–15.9)
Immature Grans (Abs): 0 10*3/uL (ref 0.0–0.1)
Immature Granulocytes: 0 %
Lymphocytes Absolute: 1.8 10*3/uL (ref 0.7–3.1)
Lymphs: 35 %
MCH: 29.6 pg (ref 26.6–33.0)
MCHC: 32.7 g/dL (ref 31.5–35.7)
MCV: 91 fL (ref 79–97)
Monocytes Absolute: 0.4 10*3/uL (ref 0.1–0.9)
Monocytes: 8 %
Neutrophils Absolute: 2.7 10*3/uL (ref 1.4–7.0)
Neutrophils: 54 %
Platelets: 189 10*3/uL (ref 150–450)
RBC: 4.49 x10E6/uL (ref 3.77–5.28)
RDW: 13.9 % (ref 12.3–15.4)
WBC: 5 10*3/uL (ref 3.4–10.8)

## 2018-02-09 LAB — LIPID PANEL
CHOLESTEROL TOTAL: 226 mg/dL — AB (ref 100–199)
Chol/HDL Ratio: 3.3 ratio (ref 0.0–4.4)
HDL: 68 mg/dL (ref >39)
LDL Calculated: 133 mg/dL — ABNORMAL HIGH (ref 0–99)
Triglycerides: 123 mg/dL (ref 0–149)
VLDL Cholesterol Cal: 25 mg/dL (ref 5–40)

## 2018-02-09 LAB — COMPREHENSIVE METABOLIC PANEL
ALT: 16 IU/L (ref 0–32)
AST: 18 IU/L (ref 0–40)
Albumin/Globulin Ratio: 1.9 (ref 1.2–2.2)
Albumin: 4.3 g/dL (ref 3.5–5.5)
Alkaline Phosphatase: 65 IU/L (ref 39–117)
BUN/Creatinine Ratio: 11 (ref 9–23)
BUN: 7 mg/dL (ref 6–24)
Bilirubin Total: 0.5 mg/dL (ref 0.0–1.2)
CO2: 23 mmol/L (ref 20–29)
Calcium: 9.1 mg/dL (ref 8.7–10.2)
Chloride: 102 mmol/L (ref 96–106)
Creatinine, Ser: 0.65 mg/dL (ref 0.57–1.00)
GFR calc Af Amer: 121 mL/min/{1.73_m2} (ref >59)
GFR calc non Af Amer: 105 mL/min/{1.73_m2} (ref >59)
Globulin, Total: 2.3 g/dL (ref 1.5–4.5)
Glucose: 96 mg/dL (ref 65–99)
POTASSIUM: 4 mmol/L (ref 3.5–5.2)
Sodium: 141 mmol/L (ref 134–144)
Total Protein: 6.6 g/dL (ref 6.0–8.5)

## 2018-02-09 LAB — TSH: TSH: 1.71 u[IU]/mL (ref 0.450–4.500)

## 2018-02-09 LAB — HEMOGLOBIN A1C
ESTIMATED AVERAGE GLUCOSE: 108 mg/dL
HEMOGLOBIN A1C: 5.4 % (ref 4.8–5.6)

## 2018-02-14 ENCOUNTER — Ambulatory Visit
Admission: RE | Admit: 2018-02-14 | Discharge: 2018-02-14 | Disposition: A | Discharge status: Home / Self Care | Payer: BC Managed Care – PPO | Source: Ambulatory Visit | Attending: Adult Health | Admitting: Adult Health

## 2018-02-14 DIAGNOSIS — Z1231 Encounter for screening mammogram for malignant neoplasm of breast: Secondary | ICD-10-CM

## 2018-05-20 ENCOUNTER — Ambulatory Visit: Payer: BC Managed Care – PPO | Admitting: Adult Health

## 2018-05-20 ENCOUNTER — Encounter: Payer: Self-pay | Admitting: Adult Health

## 2018-05-20 VITALS — BP 113/76 | HR 72 | Temp 99.0°F | Resp 17 | Wt 206.0 lb

## 2018-05-20 DIAGNOSIS — J0110 Acute frontal sinusitis, unspecified: Secondary | ICD-10-CM

## 2018-05-20 MED ORDER — HYDROCOD POLST-CPM POLST ER 10-8 MG/5ML PO SUER: 5.0000 mL | Freq: Two times a day (BID) | ORAL | 0 refills | Status: DC | PRN

## 2018-05-20 MED ORDER — AMOXICILLIN-POT CLAVULANATE 875-125 MG PO TABS: 1.0000 | ORAL_TABLET | Freq: Two times a day (BID) | ORAL | 0 refills | Status: DC

## 2018-05-20 NOTE — Assessment & Plan Note (Signed)
[place] Controlled Substance Database reviewed- no aberrancies noted. Please take Augmentin as directed and Tussionex as needed. Increase fluids/rest/vit c-2,000mg /day. Alternate OTC Acetaminophen and Ibuprofen as needed for fever/discomfort. If symptoms persist after antibiotic completed, please call clinic.

## 2018-05-20 NOTE — Progress Notes (Signed)
Subjective:    Patient ID: Pamela Lin, female    DOB: [birth year], 47 y.o.   MRN: [id number]  HPI:  Pamela Lin presents with non-productive cough, clear nasal drainage, frontal HA (pressure, 5/10), and low grade fever (highest 100 f) tha started >1.5 weeks ago. She has been using OTC "Cold Medicine", last dose was yesterday, temp today 70 f She denies CP/dyspnea/N/V/D/chills/night sweats/body aches She denies being exposed to anyone with known flu She denies tobacco/vape use  Patient Care Team    Relationship Specialty Notifications Start End  Esaw Grandchild, NP PCP - General Family Medicine  10/12/16     Patient Active Problem List   Diagnosis Date Noted  . Screening for cervical cancer 02/05/2018  . Acute frontal sinusitis 02/27/2017  . Healthcare maintenance 10/12/2016  . Obesity (BMI 30-39.9) 10/12/2016  . History of environmental allergies 10/12/2016     Past Medical History:  Diagnosis Date  . Allergy      Past Surgical History:  Procedure Laterality Date  . ADENOIDECTOMY    . CHOLECYSTECTOMY       Family History  Problem Relation Age of Onset  . Cancer Mother        breast  . Breast cancer Mother   . Cancer Father        colon  . Cancer Maternal Aunt        breast  . Breast cancer Maternal Aunt   . Cancer Paternal Aunt        breast  . Breast cancer Paternal Aunt   . Cancer Maternal Grandmother        breast  . Breast cancer Maternal Grandmother   . Cancer Paternal Grandmother        breast  . Breast cancer Paternal Grandmother      Social History   Substance and Sexual Activity  Drug Use No     Social History   Substance and Sexual Activity  Alcohol Use Yes   Comment: socially     Social History   Tobacco Use  Smoking Status Never Smoker  Smokeless Tobacco Never Used     Outpatient Encounter Medications as of 05/20/2018  Medication Sig  . azelastine (ASTELIN) 0.1 % nasal spray Place 2 sprays into both nostrils as needed  for rhinitis. Use in each nostril as directed  . fluticasone (FLONASE) 50 MCG/ACT nasal spray Place 2 sprays into both nostrils as needed for allergies or rhinitis.  . Multiple Vitamin (MULTIVITAMIN) tablet Take 1 tablet by mouth daily.  Marland Kitchen amoxicillin-clavulanate (AUGMENTIN) 875-125 MG tablet Take 1 tablet by mouth 2 (two) times daily.  . chlorpheniramine-HYDROcodone (TUSSIONEX) 10-8 MG/5ML SUER Take 5 mLs by mouth every 12 (twelve) hours as needed.   No facility-administered encounter medications on file as of 05/20/2018.     Allergies: Dust mite extract  Body mass index is 33.5 kg/m.  Blood pressure 113/76, pulse 72, temperature 99 F (37.2 C), temperature source Oral, resp. rate 17, weight 206 lb (93.4 kg), SpO2 99 %.  Review of Systems  Constitutional: Positive for activity change, appetite change and fatigue. Negative for chills, diaphoresis, fever and unexpected weight change.  HENT: Positive for congestion, postnasal drip, rhinorrhea, sinus pressure, sinus pain, sneezing and sore throat. Negative for voice change.   Eyes: Negative for visual disturbance.  Respiratory: Positive for cough. Negative for chest tightness, shortness of breath and wheezing.   Cardiovascular: Negative for chest pain, palpitations and leg swelling.  Neurological: Positive for headaches. Negative for  dizziness.  Hematological: Does not bruise/bleed easily.  Psychiatric/Behavioral: Positive for sleep disturbance.       Objective:   Physical Exam  Constitutional: She appears well-developed and well-nourished. No distress.  HENT:  Head: Normocephalic and atraumatic.  Right Ear: External ear normal. Tympanic membrane is erythematous and bulging. No decreased hearing is noted.  Left Ear: External ear normal. Tympanic membrane is bulging. Tympanic membrane is not erythematous. No decreased hearing is noted.  Nose: Mucosal edema and rhinorrhea present. Right sinus exhibits frontal sinus tenderness. Right  sinus exhibits no maxillary sinus tenderness. Left sinus exhibits frontal sinus tenderness. Left sinus exhibits no maxillary sinus tenderness.  Mouth/Throat: Mucous membranes are normal. She does not have dentures. Normal dentition. Posterior oropharyngeal edema and posterior oropharyngeal erythema present. No oropharyngeal exudate or tonsillar abscesses. Tonsils are 0 on the right. Tonsils are 0 on the left. No tonsillar exudate.  Eyes: Pupils are equal, round, and reactive to light. Conjunctivae and EOM are normal.  Neck: Normal range of motion. Neck supple.  Cardiovascular: Normal rate, regular rhythm, normal heart sounds and intact distal pulses.  No murmur heard. Pulmonary/Chest: Effort normal and breath sounds normal. No stridor. No respiratory distress. She has no wheezes. She has no rales. She exhibits no tenderness.  Lymphadenopathy:    She has no cervical adenopathy.  Skin: Skin is warm and dry. Capillary refill takes less than 2 seconds. No rash noted. She is not diaphoretic. No erythema. No pallor.  Psychiatric: She has a normal mood and affect. Her behavior is normal. Judgment and thought content normal.  Nursing note and vitals reviewed.     Assessment & Plan:   1. Acute frontal sinusitis, recurrence not specified     Acute frontal sinusitis [place] Controlled Substance Database reviewed- no aberrancies noted. Please take Augmentin as directed and Tussionex as needed. Increase fluids/rest/vit c-2,000mg /day. Alternate OTC Acetaminophen and Ibuprofen as needed for fever/discomfort. If symptoms persist after antibiotic completed, please call clinic.    FOLLOW-UP:  Return if symptoms worsen or fail to improve.

## 2018-05-20 NOTE — Patient Instructions (Signed)

## 2018-05-21 ENCOUNTER — Ambulatory Visit: Payer: BC Managed Care – PPO | Admitting: Adult Health

## 2018-05-22 ENCOUNTER — Encounter: Payer: Self-pay | Admitting: Adult Health

## 2018-06-24 ENCOUNTER — Encounter: Payer: Self-pay | Admitting: Adult Health

## 2018-06-26 NOTE — Progress Notes (Signed)
Subjective:    Patient ID: Pamela Lin, female    DOB: January 03, 1971, 47 y.o.   MRN: [id number]  HPI:  Pamela Lin presents with clear nasal drainage, facial pressure, and occasional night sweats that have been present for the last week. She completed course of Augmentin in early Nov for sinusitis. She denies fever/chills/cough/N/V/D/abd pain She has been pushing fluids and using OTC Aleve, last dose was yesterday, current temp 98.40f oral  Patient Care Team    Relationship Specialty Notifications Start End  Esaw Grandchild, NP PCP - General Family Medicine  10/12/16     Patient Active Problem List   Diagnosis Date Noted  . Maxillary sinusitis 06/27/2018  . Screening for cervical cancer 02/05/2018  . Acute frontal sinusitis 02/27/2017  . Healthcare maintenance 10/12/2016  . Obesity (BMI 30-39.9) 10/12/2016  . History of environmental allergies 10/12/2016     Past Medical History:  Diagnosis Date  . Allergy      Past Surgical History:  Procedure Laterality Date  . ADENOIDECTOMY    . CHOLECYSTECTOMY       Family History  Problem Relation Age of Onset  . Cancer Mother        breast  . Breast cancer Mother   . Cancer Father        colon  . Cancer Maternal Aunt        breast  . Breast cancer Maternal Aunt   . Cancer Paternal Aunt        breast  . Breast cancer Paternal Aunt   . Cancer Maternal Grandmother        breast  . Breast cancer Maternal Grandmother   . Cancer Paternal Grandmother        breast  . Breast cancer Paternal Grandmother      Social History   Substance and Sexual Activity  Drug Use No     Social History   Substance and Sexual Activity  Alcohol Use Yes   Comment: socially     Social History   Tobacco Use  Smoking Status Never Smoker  Smokeless Tobacco Never Used     Outpatient Encounter Medications as of 06/27/2018  Medication Sig  . azelastine (ASTELIN) 0.1 % nasal spray Place 2 sprays into both nostrils as needed for  rhinitis. Use in each nostril as directed  . chlorpheniramine-HYDROcodone (TUSSIONEX) 10-8 MG/5ML SUER Take 5 mLs by mouth every 12 (twelve) hours as needed.  . fluticasone (FLONASE) 50 MCG/ACT nasal spray Place 2 sprays into both nostrils as needed for allergies or rhinitis.  . Multiple Vitamin (MULTIVITAMIN) tablet Take 1 tablet by mouth daily.  . [DISCONTINUED] fluticasone (FLONASE) 50 MCG/ACT nasal spray Place 2 sprays into both nostrils as needed for allergies or rhinitis.  . [DISCONTINUED] amoxicillin-clavulanate (AUGMENTIN) 875-125 MG tablet Take 1 tablet by mouth 2 (two) times daily.   No facility-administered encounter medications on file as of 06/27/2018.     Allergies: Dust mite extract  Body mass index is 32.98 kg/m.  Blood pressure 114/74, pulse (!) 56, temperature 98.5 F (36.9 C), temperature source Oral, height 5' 5.75" (1.67 m), weight 202 lb 12.8 oz (92 kg), SpO2 99 %.     Review of Systems  Constitutional: Positive for chills and fatigue. Negative for activity change, appetite change, diaphoresis, fever and unexpected weight change.  HENT: Positive for congestion, postnasal drip, sinus pressure and sore throat. Negative for ear discharge, ear pain, facial swelling, hearing loss, mouth sores, nosebleeds, rhinorrhea, sneezing and voice change.  Eyes: Negative for visual disturbance.  Respiratory: Negative for cough, chest tightness, shortness of breath, wheezing and stridor.   Cardiovascular: Negative for chest pain, palpitations and leg swelling.  Gastrointestinal: Negative for abdominal distention, abdominal pain, blood in stool, constipation, diarrhea, nausea and vomiting.  Genitourinary: Negative for difficulty urinating and flank pain.  Musculoskeletal: Negative for arthralgias, back pain, gait problem, joint swelling, myalgias, neck pain and neck stiffness.  Skin: Negative for color change, pallor, rash and wound.  Neurological: Negative for dizziness and  headaches.  Hematological: Does not bruise/bleed easily.  Psychiatric/Behavioral: Negative for agitation, behavioral problems, confusion, decreased concentration, dysphoric mood, hallucinations, self-injury, sleep disturbance and suicidal ideas. The patient is not nervous/anxious and is not hyperactive.        Objective:   Physical Exam Vitals signs and nursing note reviewed.  Constitutional:      General: She is not in acute distress.    Appearance: Normal appearance. She is normal weight. She is not ill-appearing, toxic-appearing or diaphoretic.  HENT:     Head: Normocephalic and atraumatic.     Right Ear: Hearing, tympanic membrane, ear canal and external ear normal. No decreased hearing noted. Tympanic membrane is not erythematous or bulging.     Left Ear: Hearing, tympanic membrane, ear canal and external ear normal. No decreased hearing noted. Tympanic membrane is not erythematous or bulging.     Nose: Nose normal.     Right Sinus: No maxillary sinus tenderness or frontal sinus tenderness.     Left Sinus: No maxillary sinus tenderness or frontal sinus tenderness.     Mouth/Throat:     Mouth: Mucous membranes are moist.     Dentition: Normal dentition.     Pharynx: Oropharynx is clear. Uvula midline. Posterior oropharyngeal erythema present. No pharyngeal swelling or oropharyngeal exudate.     Tonsils: No tonsillar exudate. Swelling: 0 on the right. 0 on the left.  Cardiovascular:     Rate and Rhythm: Normal rate and regular rhythm.     Pulses: Normal pulses.     Heart sounds: Normal heart sounds. No murmur. No friction rub. No gallop.   Pulmonary:     Effort: Pulmonary effort is normal. No respiratory distress.     Breath sounds: Normal breath sounds. No stridor. No wheezing, rhonchi or rales.  Chest:     Chest wall: No tenderness.  Skin:    Capillary Refill: Capillary refill takes less than 2 seconds.  Neurological:     Mental Status: She is alert and oriented to person,  place, and time.     Coordination: Coordination normal.  Psychiatric:        Mood and Affect: Mood normal.        Behavior: Behavior normal.        Thought Content: Thought content normal.        Judgment: Judgment normal.       Assessment & Plan:   1. Maxillary sinusitis, unspecified chronicity     Maxillary sinusitis Please use Flonase twice daily for seven days. Increase fluids, rest, vit c-2,000mg . Alternate OTC Acetaminophen and Ibuprofen as needed for discomfort. If symptoms do not improve in 7 days, please contact clinic.    FOLLOW-UP:  Return if symptoms worsen or fail to improve.

## 2018-06-27 ENCOUNTER — Encounter: Payer: Self-pay | Admitting: Adult Health

## 2018-06-27 ENCOUNTER — Ambulatory Visit: Payer: BC Managed Care – PPO | Admitting: Adult Health

## 2018-06-27 VITALS — BP 114/74 | HR 56 | Temp 98.5°F | Ht 65.75 in | Wt 202.8 lb

## 2018-06-27 DIAGNOSIS — J320 Chronic maxillary sinusitis: Secondary | ICD-10-CM

## 2018-06-27 MED ORDER — FLUTICASONE PROPIONATE 50 MCG/ACT NA SUSP: 2.0000 | NASAL | 3 refills | Status: DC | PRN

## 2018-06-27 NOTE — Assessment & Plan Note (Signed)
Please use Flonase twice daily for seven days. Increase fluids, rest, vit c-2,000mg . Alternate OTC Acetaminophen and Ibuprofen as needed for discomfort. If symptoms do not improve in 7 days, please contact clinic.

## 2018-06-27 NOTE — Patient Instructions (Addendum)
Sinusitis, Adult Sinusitis is inflammation of your sinuses. Sinuses are hollow spaces in the bones around your face. Your sinuses are located:  Around your eyes.  In the middle of your forehead.  Behind your nose.  In your cheekbones. Mucus normally drains out of your sinuses. When your nasal tissues become inflamed or swollen, mucus can become trapped or blocked. This allows bacteria, viruses, and fungi to grow, which leads to infection. Most infections of the sinuses are caused by a virus. Sinusitis can develop quickly. It can last for up to 4 weeks (acute) or for more than 12 weeks (chronic). Sinusitis often develops after a cold. What are the causes? This condition is caused by anything that creates swelling in the sinuses or stops mucus from draining. This includes:  Allergies.  Asthma.  Infection from bacteria or viruses.  Deformities or blockages in your nose or sinuses.  Abnormal growths in the nose (nasal polyps).  Pollutants, such as chemicals or irritants in the air.  Infection from fungi (rare). What increases the risk? You are more likely to develop this condition if you:  Have a weak body defense system (immune system).  Do a lot of swimming or diving.  Overuse nasal sprays.  Smoke. What are the signs or symptoms? The main symptoms of this condition are pain and a feeling of pressure around the affected sinuses. Other symptoms include:  Stuffy nose or congestion.  Thick drainage from your nose.  Swelling and warmth over the affected sinuses.  Headache.  Upper toothache.  A cough that may get worse at night.  Extra mucus that collects in the throat or the back of the nose (postnasal drip).  Decreased sense of smell and taste.  Fatigue.  A fever.  Sore throat.  Bad breath. How is this diagnosed? This condition is diagnosed based on:  Your symptoms.  Your medical history.  A physical exam.  Tests to find out if your condition is  acute or chronic. This may include: ? Checking your nose for nasal polyps. ? Viewing your sinuses using a device that has a light (endoscope). ? Testing for allergies or bacteria. ? Imaging tests, such as an MRI or CT scan. In rare cases, a bone biopsy may be done to rule out more serious types of fungal sinus disease. How is this treated? Treatment for sinusitis depends on the cause and whether your condition is chronic or acute.  If caused by a virus, your symptoms should go away on their own within 10 days. You may be given medicines to relieve symptoms. They include: ? Medicines that shrink swollen nasal passages (topical intranasal decongestants). ? Medicines that treat allergies (antihistamines). ? A spray that eases inflammation of the nostrils (topical intranasal corticosteroids). ? Rinses that help get rid of thick mucus in your nose (nasal saline washes).  If caused by bacteria, your health care provider may recommend waiting to see if your symptoms improve. Most bacterial infections will get better without antibiotic medicine. You may be given antibiotics if you have: ? A severe infection. ? A weak immune system.  If caused by narrow nasal passages or nasal polyps, you may need to have surgery. Follow these instructions at home: Medicines  Take, use, or apply over-the-counter and prescription medicines only as told by your health care provider. These may include nasal sprays.  If you were prescribed an antibiotic medicine, take it as told by your health care provider. Do not stop taking the antibiotic even if you start   to feel better. Hydrate and humidify   Drink enough fluid to keep your urine pale yellow. Staying hydrated will help to thin your mucus.  Use a cool mist humidifier to keep the humidity level in your home above 50%.  Inhale steam for 10-15 minutes, 3-4 times a day, or as told by your health care provider. You can do this in the bathroom while a hot shower is  running.  Limit your exposure to cool or dry air. Rest  Rest as much as possible.  Sleep with your head raised (elevated).  Make sure you get enough sleep each night. General instructions   Apply a warm, moist washcloth to your face 3-4 times a day or as told by your health care provider. This will help with discomfort.  Wash your hands often with soap and water to reduce your exposure to germs. If soap and water are not available, use hand sanitizer.  Do not smoke. Avoid being around people who are smoking (secondhand smoke).  Keep all follow-up visits as told by your health care provider. This is important. Contact a health care provider if:  You have a fever.  Your symptoms get worse.  Your symptoms do not improve within 10 days. Get help right away if:  You have a severe headache.  You have persistent vomiting.  You have severe pain or swelling around your face or eyes.  You have vision problems.  You develop confusion.  Your neck is stiff.  You have trouble breathing. Summary  Sinusitis is soreness and inflammation of your sinuses. Sinuses are hollow spaces in the bones around your face.  This condition is caused by nasal tissues that become inflamed or swollen. The swelling traps or blocks the flow of mucus. This allows bacteria, viruses, and fungi to grow, which leads to infection.  If you were prescribed an antibiotic medicine, take it as told by your health care provider. Do not stop taking the antibiotic even if you start to feel better.  Keep all follow-up visits as told by your health care provider. This is important. This information is not intended to replace advice given to you by your health care provider. Make sure you discuss any questions you have with your health care provider. Document Released: 06/26/2005 Document Revised: 11/26/2017 Document Reviewed: 11/26/2017 Elsevier Interactive Patient Education  2019 [employer].  Please use  Flonase twice daily for seven days. Increase fluids, rest, vit c-2,000mg . Alternate OTC Acetaminophen and Ibuprofen as needed for discomfort. If symptoms do not improve in 7 days, please contact clinic. FEEL BETTER!

## 2018-12-09 ENCOUNTER — Encounter: Payer: Self-pay | Admitting: Adult Health

## 2018-12-18 ENCOUNTER — Other Ambulatory Visit: Payer: Self-pay

## 2018-12-18 DIAGNOSIS — Z0182 Encounter for allergy testing: Secondary | ICD-10-CM

## 2018-12-18 NOTE — Progress Notes (Signed)
amb  

## 2018-12-27 ENCOUNTER — Other Ambulatory Visit: Payer: Self-pay

## 2018-12-27 ENCOUNTER — Encounter: Payer: Self-pay | Admitting: Allergy

## 2018-12-27 ENCOUNTER — Ambulatory Visit: Payer: BC Managed Care – PPO | Admitting: Allergy

## 2018-12-27 VITALS — BP 118/76 | HR 59 | Temp 98.3°F | Resp 16 | Ht 65.75 in | Wt 210.2 lb

## 2018-12-27 DIAGNOSIS — G4489 Other headache syndrome: Secondary | ICD-10-CM | POA: Diagnosis not present

## 2018-12-27 DIAGNOSIS — J3089 Other allergic rhinitis: Secondary | ICD-10-CM

## 2018-12-27 NOTE — Patient Instructions (Addendum)
Allergies with headache  - environmental allergy skin testing today is positive to grass pollens, weed pollens, mold and dust mites  - Allergen avoidance measures discussed and handouts provided  -For control of nasal drainage/drip use nasal Atrovent 2 sprays each nostril up to 3-4 times a day as needed  -Can continue use of both Flonase and Astelin as needed for maintenance of nasal congestion and drainage  -If medication management is ineffective then consider a course of allergen immunotherapy (allergy shots) to help control symptoms better and reduce the need for medications  Follow-up 4-6 months or sooner if needed

## 2018-12-27 NOTE — Progress Notes (Signed)
New Patient Note  RE: Pamela Lin MRN: [id number] DOB: [birth year] Date of Office Visit: 12/27/2018  Referring provider: Esaw Grandchild, NP Primary care provider: Esaw Grandchild, NP  Chief Complaint: allergies, headache  History of present illness: Pamela Lin is a 48 y.o. female presenting today for consultation for allergy testing.  She is concerned for mold allergy as she reports she has mildew in the home.  She has had allergy testing before when she was much younger and recalls being positive to dust mites.  She has dust mite covers on mattress and pillows as well as reports has changed out her carpeting to solid surface.  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    She reports she has a headache daily for past 20-30 years.  She reports a dull frontal headache.  She reports her parents both suffered from headaches.  She denies any auras, vision disturbances, N/V and reports light and movement do not impact headache.  She believes the headache is related to allergies.   She does report other symptoms of sneezing, runny nose primarily that are year-round.  She reports after using flonase and astelin that "fixes" the nasal symptoms immediately however she will notice the headache more will lay down.   She has tried claritin, zyrtec and allegra in the past that did not help at all.    No history of asthma or food allergy.  She has history of eczema as a teen.    Review of systems: Review of Systems  Constitutional: Negative for chills, fever and malaise/fatigue.  HENT: Negative for congestion, ear discharge, nosebleeds and sore throat.   Eyes: Negative for pain, discharge and redness.  Respiratory: Negative for cough, shortness of breath and wheezing.   Cardiovascular: Negative for chest pain.  Gastrointestinal: Negative for abdominal pain, constipation, diarrhea, heartburn, nausea and vomiting.  Musculoskeletal: Negative for joint pain.  Skin: Negative for  itching and rash.  Neurological: Positive for headaches.    All other systems negative unless noted above in HPI  Past medical history: Past Medical History:  Diagnosis Date  . Allergy     Past surgical history: Past Surgical History:  Procedure Laterality Date  . ADENOIDECTOMY    . CHOLECYSTECTOMY      Family history:  Family History  Problem Relation Age of Onset  . Cancer Mother        breast  . Breast cancer Mother   . Cancer Father        colon  . Cancer Maternal Aunt        breast  . Breast cancer Maternal Aunt   . Cancer Paternal Aunt        breast  . Breast cancer Paternal Aunt   . Cancer Maternal Grandmother        breast  . Breast cancer Maternal Grandmother   . Cancer Paternal Grandmother        breast  . Breast cancer Paternal Grandmother     Social history: She lives in a home without carpeting with electric heating and central cooling.  There are dogs in the home.  There is no concern for roaches in the home.  She is a Higher education careers adviser.  She denies a smoking history.    Medication List: Allergies as of 12/27/2018      Reactions   Dust Mite Extract    Other       Medication List  Accurate as of December 27, 2018 12:10 PM. If you have any questions, ask your nurse or doctor.        STOP taking these medications   chlorpheniramine-HYDROcodone 10-8 MG/5ML Suer Commonly known as: TUSSIONEX Stopped by: Avigayil Ton Charmian Muff, MD     TAKE these medications   azelastine 0.1 % nasal spray Commonly known as: ASTELIN Place 2 sprays into both nostrils as needed for rhinitis. Use in each nostril as directed   fluticasone 50 MCG/ACT nasal spray Commonly known as: FLONASE Place 2 sprays into both nostrils as needed for allergies or rhinitis.   multivitamin tablet Take 1 tablet by mouth daily.       Known medication allergies: Allergies  Allergen Reactions  . Dust Mite Extract   . Other      Physical examination: Blood pressure  118/76, pulse (!) 59, temperature 98.3 F (36.8 C), temperature source Temporal, resp. rate 16, height 5' 5.75" (1.67 m), weight 210 lb 3.2 oz (95.3 kg), SpO2 98 %.  General: Alert, interactive, in no acute distress. HEENT: PERRLA, TMs pearly gray, turbinates mildly edematous with clear discharge, post-pharynx non erythematous. Neck: Supple without lymphadenopathy. Lungs: Clear to auscultation without wheezing, rhonchi or rales. {no increased work of breathing. CV: Normal S1, S2 without murmurs. Abdomen: Nondistended, nontender. Skin: Warm and dry, without lesions or rashes. Extremities:  No clubbing, cyanosis or edema. Neuro:   Grossly intact.  Diagnositics/Labs: Allergy testing: environmental allergy skin prick testing is positive to cocklebur, mucor plumbeus, dust mites.  Intradermal testing is positive to [place].   Allergy testing results were read and interpreted by provider, documented by clinical staff.   Assessment and plan:   Allergic rhinitis with headache  - environmental allergy skin testing today is positive to grass pollens, weed pollens, mold and dust mites  - Allergen avoidance measures discussed and handouts provided  -For control of nasal drainage/drip use nasal Atrovent 2 sprays each nostril up to 3-4 times a day as needed  -Can continue use of both Flonase and Astelin as needed for maintenance of nasal congestion and drainage  -If medication management is ineffective then consider a course of allergen immunotherapy (allergy shots) to help control symptoms better and reduce the need for medications  Follow-up 4-6 months or sooner if needed  I appreciate the opportunity to take part in Shariyah's care. Please do not hesitate to contact me with questions.  Sincerely,   Prudy Feeler, MD Allergy/Immunology [facility]

## 2019-01-02 ENCOUNTER — Other Ambulatory Visit: Payer: Self-pay | Admitting: Adult Health

## 2019-01-02 DIAGNOSIS — Z1231 Encounter for screening mammogram for malignant neoplasm of breast: Secondary | ICD-10-CM

## 2019-01-15 ENCOUNTER — Other Ambulatory Visit: Payer: Self-pay

## 2019-01-15 ENCOUNTER — Other Ambulatory Visit (INDEPENDENT_AMBULATORY_CARE_PROVIDER_SITE_OTHER): Payer: BC Managed Care – PPO

## 2019-01-15 DIAGNOSIS — Z0000 Encounter for general adult medical examination without abnormal findings: Secondary | ICD-10-CM

## 2019-01-16 LAB — CBC WITH DIFFERENTIAL/PLATELET
Basophils Absolute: 0 10*3/uL (ref 0.0–0.2)
Basos: 1 %
EOS (ABSOLUTE): 0.1 10*3/uL (ref 0.0–0.4)
Eos: 1 %
Hematocrit: 41.3 % (ref 34.0–46.6)
Hemoglobin: 14.5 g/dL (ref 11.1–15.9)
Immature Grans (Abs): 0 10*3/uL (ref 0.0–0.1)
Immature Granulocytes: 0 %
Lymphocytes Absolute: 1.8 10*3/uL (ref 0.7–3.1)
Lymphs: 32 %
MCH: 30.3 pg (ref 26.6–33.0)
MCHC: 35.1 g/dL (ref 31.5–35.7)
MCV: 86 fL (ref 79–97)
Monocytes Absolute: 0.6 10*3/uL (ref 0.1–0.9)
Monocytes: 10 %
Neutrophils Absolute: 3.1 10*3/uL (ref 1.4–7.0)
Neutrophils: 56 %
Platelets: 199 10*3/uL (ref 150–450)
RBC: 4.79 x10E6/uL (ref 3.77–5.28)
RDW: 12.7 % (ref 11.7–15.4)
WBC: 5.6 10*3/uL (ref 3.4–10.8)

## 2019-01-16 LAB — LIPID PANEL
Chol/HDL Ratio: 3.9 ratio (ref 0.0–4.4)
Cholesterol, Total: 231 mg/dL — ABNORMAL HIGH (ref 100–199)
HDL: 60 mg/dL (ref >39)
LDL Calculated: 146 mg/dL — ABNORMAL HIGH (ref 0–99)
Triglycerides: 125 mg/dL (ref 0–149)
VLDL Cholesterol Cal: 25 mg/dL (ref 5–40)

## 2019-01-16 LAB — COMPREHENSIVE METABOLIC PANEL
ALT: 26 IU/L (ref 0–32)
AST: 20 IU/L (ref 0–40)
Albumin/Globulin Ratio: 1.9 (ref 1.2–2.2)
Albumin: 4.5 g/dL (ref 3.8–4.8)
Alkaline Phosphatase: 70 IU/L (ref 39–117)
BUN/Creatinine Ratio: 11 (ref 9–23)
BUN: 8 mg/dL (ref 6–24)
Bilirubin Total: 0.7 mg/dL (ref 0.0–1.2)
CO2: 22 mmol/L (ref 20–29)
Calcium: 9.3 mg/dL (ref 8.7–10.2)
Chloride: 101 mmol/L (ref 96–106)
Creatinine, Ser: 0.7 mg/dL (ref 0.57–1.00)
GFR calc Af Amer: 117 mL/min/{1.73_m2} (ref >59)
GFR calc non Af Amer: 101 mL/min/{1.73_m2} (ref >59)
Globulin, Total: 2.4 g/dL (ref 1.5–4.5)
Glucose: 93 mg/dL (ref 65–99)
Potassium: 3.8 mmol/L (ref 3.5–5.2)
Sodium: 138 mmol/L (ref 134–144)
Total Protein: 6.9 g/dL (ref 6.0–8.5)

## 2019-01-16 LAB — HEMOGLOBIN A1C
Est. average glucose Bld gHb Est-mCnc: 108 mg/dL
Hgb A1c MFr Bld: 5.4 % (ref 4.8–5.6)

## 2019-01-16 LAB — TSH: TSH: 1.85 u[IU]/mL (ref 0.450–4.500)

## 2019-01-21 ENCOUNTER — Encounter: Payer: Self-pay | Admitting: Adult Health

## 2019-01-29 ENCOUNTER — Ambulatory Visit (INDEPENDENT_AMBULATORY_CARE_PROVIDER_SITE_OTHER): Payer: BC Managed Care – PPO | Admitting: Adult Health

## 2019-01-29 ENCOUNTER — Other Ambulatory Visit: Payer: Self-pay

## 2019-01-29 ENCOUNTER — Encounter: Payer: Self-pay | Admitting: Adult Health

## 2019-01-29 VITALS — BP 122/72 | HR 57 | Temp 98.5°F | Ht 66.25 in | Wt 208.5 lb

## 2019-01-29 DIAGNOSIS — E7800 Pure hypercholesterolemia, unspecified: Secondary | ICD-10-CM

## 2019-01-29 DIAGNOSIS — Z1211 Encounter for screening for malignant neoplasm of colon: Secondary | ICD-10-CM | POA: Diagnosis not present

## 2019-01-29 DIAGNOSIS — E669 Obesity, unspecified: Secondary | ICD-10-CM | POA: Diagnosis not present

## 2019-01-29 DIAGNOSIS — Z0000 Encounter for general adult medical examination without abnormal findings: Secondary | ICD-10-CM

## 2019-01-29 DIAGNOSIS — R519 Headache, unspecified: Secondary | ICD-10-CM

## 2019-01-29 DIAGNOSIS — R51 Headache: Secondary | ICD-10-CM

## 2019-01-29 DIAGNOSIS — G43711 Chronic migraine without aura, intractable, with status migrainosus: Secondary | ICD-10-CM | POA: Insufficient documentation

## 2019-01-29 NOTE — Assessment & Plan Note (Signed)
Body mass index is 33.4 kg/m. Current wt 208 Increase regular exercise, reduce saturated fat in diet

## 2019-01-29 NOTE — Assessment & Plan Note (Signed)
>>  ASSESSMENT AND PLAN FOR HA (HEADACHE) WRITTEN ON 01/29/2019 11:59 AM BY DANFORD, KATY D, NP  She reports continued HA- 7 HA days per week HAs have been occurring >20 years She denies hx of migraine She denies visual disturbances or photophobia or N/V during HA She states "I think that they may be tension HAs"  She will occasionally treat with OTC Ibuprofen  She has never been evaluated by Neurology

## 2019-01-29 NOTE — Assessment & Plan Note (Signed)
She reports continued HA- 7 HA days per week HAs have been occurring >20 years She denies hx of migraine She denies visual disturbances or photophobia or N/V during HA She states "I think that they may be tension HAs"  She will occasionally treat with OTC Ibuprofen  She has never been evaluated by Neurology

## 2019-01-29 NOTE — Assessment & Plan Note (Addendum)
01/15/2019 The 10-year ASCVD risk score Mikey Bussing DC Brooke Bonito., et al., 2013) is: 1.2%  Values used to calculate the score:   Age: 48 years   Sex: Female   Is Non-Hispanic African American: No   Diabetic: No   Tobacco smoker: No   Systolic Blood Pressure: 919 mmHg   Is BP treated: No   HDL Cholesterol: 60 mg/dL   Total Cholesterol: 231 mg/dL  LDL-146  Increase in tot and LDL- need to focus on diet/exercise  Will re-check in 4 months Her father has CAD- CABG x 3

## 2019-01-29 NOTE — Assessment & Plan Note (Signed)
Overall you are doing well! To reduce your total and LDL (bad) cholesterol- reduce saturated fat, continue walking, and start practicing yoga. Continue your excellent water intake. Please schedule fasting lab appt in 4 months, complete physical in 1 year (fasting labs the week prior). Someone from Gastroenterology will call you to schedule colonoscopy. Continue to social distance and wear a mask when out in public.

## 2019-01-29 NOTE — Assessment & Plan Note (Signed)
>>  ASSESSMENT AND PLAN FOR OBESITY (BMI 30-39.9) WRITTEN ON 01/29/2019 11:54 AM BY DANFORD, KATY D, NP  Body mass index is 33.4 kg/m. Current wt 208 Increase regular exercise, reduce saturated fat in diet

## 2019-01-29 NOTE — Patient Instructions (Addendum)
Preventive Care for Adults, Female  A healthy lifestyle and preventive care can promote health and wellness. Preventive health guidelines for women include the following key practices.   A routine yearly physical is a good way to check with your health care provider about your health and preventive screening. It is a chance to share any concerns and updates on your health and to receive a thorough exam.   Visit your dentist for a routine exam and preventive care every 6 months. Brush your teeth twice a day and floss once a day. Good oral hygiene prevents tooth decay and gum disease.   The frequency of eye exams is based on your age, health, family medical history, use of contact lenses, and other factors. Follow your health care provider's recommendations for frequency of eye exams.   Eat a healthy diet. Foods like vegetables, fruits, whole grains, low-fat dairy products, and lean protein foods contain the nutrients you need without too many calories. Decrease your intake of foods high in solid fats, added sugars, and salt. Eat the right amount of calories for you.Get information about a proper diet from your health care provider, if necessary.   Regular physical exercise is one of the most important things you can do for your health. Most adults should get at least 150 minutes of moderate-intensity exercise (any activity that increases your heart rate and causes you to sweat) each week. In addition, most adults need muscle-strengthening exercises on 2 or more days a week.   Maintain a healthy weight. The body mass index (BMI) is a screening tool to identify possible weight problems. It provides an estimate of body fat based on height and weight. Your health care provider can find your BMI, and can help you achieve or maintain a healthy weight.For adults 20 years and older:   - A BMI below 18.5 is considered underweight.   - A BMI of 18.5 to 24.9 is normal.   - A BMI of 25 to 29.9 is  considered overweight.   - A BMI of 30 and above is considered obese.   Maintain normal blood lipids and cholesterol levels by exercising and minimizing your intake of trans and saturated fats.  Eat a balanced diet with plenty of fruit and vegetables. Blood tests for lipids and cholesterol should begin at age 20 and be repeated every 5 years minimum.  If your lipid or cholesterol levels are high, you are over 40, or you are at high risk for heart disease, you may need your cholesterol levels checked more frequently.Ongoing high lipid and cholesterol levels should be treated with medicines if diet and exercise are not working.   If you smoke, find out from your health care provider how to quit. If you do not use tobacco, do not start.   Lung cancer screening is recommended for adults aged 55-80 years who are at high risk for developing lung cancer because of a history of smoking. A yearly low-dose CT scan of the lungs is recommended for people who have at least a 30-pack-year history of smoking and are a current smoker or have quit within the past 15 years. A pack year of smoking is smoking an average of 1 pack of cigarettes a day for 1 year (for example: 1 pack a day for 30 years or 2 packs a day for 15 years). Yearly screening should continue until the smoker has stopped smoking for at least 15 years. Yearly screening should be stopped for people who develop a   health problem that would prevent them from having lung cancer treatment.   If you are pregnant, do not drink alcohol. If you are breastfeeding, be very cautious about drinking alcohol. If you are not pregnant and choose to drink alcohol, do not have more than 1 drink per day. One drink is considered to be 12 ounces (355 mL) of beer, 5 ounces (148 mL) of wine, or 1.5 ounces (44 mL) of liquor.   Avoid use of street drugs. Do not share needles with anyone. Ask for help if you need support or instructions about stopping the use of  drugs.   High blood pressure causes heart disease and increases the risk of stroke. Your blood pressure should be checked at least yearly.  Ongoing high blood pressure should be treated with medicines if weight loss and exercise do not work.   If you are 69-55 years old, ask your health care provider if you should take aspirin to prevent strokes.   Diabetes screening involves taking a blood sample to check your fasting blood sugar level. This should be done once every 3 years, after age 38, if you are within normal weight and without risk factors for diabetes. Testing should be considered at a younger age or be carried out more frequently if you are overweight and have at least 1 risk factor for diabetes.   Breast cancer screening is essential preventive care for women. You should practice "breast self-awareness."  This means understanding the normal appearance and feel of your breasts and may include breast self-examination.  Any changes detected, no matter how small, should be reported to a health care provider.  Women in their 80s and 30s should have a clinical breast exam (CBE) by a health care provider as part of a regular health exam every 1 to 3 years.  After age 66, women should have a CBE every year.  Starting at age 1, women should consider having a mammogram (breast X-ray test) every year.  Women who have a family history of breast cancer should talk to their health care provider about genetic screening.  Women at a high risk of breast cancer should talk to their health care providers about having an MRI and a mammogram every year.   -Breast cancer gene (BRCA)-related cancer risk assessment is recommended for women who have family members with BRCA-related cancers. BRCA-related cancers include breast, ovarian, tubal, and peritoneal cancers. Having family members with these cancers may be associated with an increased risk for harmful changes (mutations) in the breast cancer genes BRCA1 and  BRCA2. Results of the assessment will determine the need for genetic counseling and BRCA1 and BRCA2 testing.   The Pap test is a screening test for cervical cancer. A Pap test can show cell changes on the cervix that might become cervical cancer if left untreated. A Pap test is a procedure in which cells are obtained and examined from the lower end of the uterus (cervix).   - Women should have a Pap test starting at age 57.   - Between ages 90 and 70, Pap tests should be repeated every 2 years.   - Beginning at age 63, you should have a Pap test every 3 years as long as the past 3 Pap tests have been normal.   - Some women have medical problems that increase the chance of getting cervical cancer. Talk to your health care provider about these problems. It is especially important to talk to your health care provider if a  new problem develops soon after your last Pap test. In these cases, your health care provider may recommend more frequent screening and Pap tests.   - The above recommendations are the same for women who have or have not gotten the vaccine for human papillomavirus (HPV).   - If you had a hysterectomy for a problem that was not cancer or a condition that could lead to cancer, then you no longer need Pap tests. Even if you no longer need a Pap test, a regular exam is a good idea to make sure no other problems are starting.   - If you are between ages 36 and 66 years, and you have had normal Pap tests going back 10 years, you no longer need Pap tests. Even if you no longer need a Pap test, a regular exam is a good idea to make sure no other problems are starting.   - If you have had past treatment for cervical cancer or a condition that could lead to cancer, you need Pap tests and screening for cancer for at least 20 years after your treatment.   - If Pap tests have been discontinued, risk factors (such as a new sexual partner) need to be reassessed to determine if screening should  be resumed.   - The HPV test is an additional test that may be used for cervical cancer screening. The HPV test looks for the virus that can cause the cell changes on the cervix. The cells collected during the Pap test can be tested for HPV. The HPV test could be used to screen women aged 70 years and older, and should be used in women of any age who have unclear Pap test results. After the age of 67, women should have HPV testing at the same frequency as a Pap test.   Colorectal cancer can be detected and often prevented. Most routine colorectal cancer screening begins at the age of 57 years and continues through age 26 years. However, your health care provider may recommend screening at an earlier age if you have risk factors for colon cancer. On a yearly basis, your health care provider may provide home test kits to check for hidden blood in the stool.  Use of a small camera at the end of a tube, to directly examine the colon (sigmoidoscopy or colonoscopy), can detect the earliest forms of colorectal cancer. Talk to your health care provider about this at age 23, when routine screening begins. Direct exam of the colon should be repeated every 5 -10 years through age 49 years, unless early forms of pre-cancerous polyps or small growths are found.   People who are at an increased risk for hepatitis B should be screened for this virus. You are considered at high risk for hepatitis B if:  -You were born in a country where hepatitis B occurs often. Talk with your health care provider about which countries are considered high risk.  - Your parents were born in a high-risk country and you have not received a shot to protect against hepatitis B (hepatitis B vaccine).  - You have HIV or AIDS.  - You use needles to inject street drugs.  - You live with, or have sex with, someone who has Hepatitis B.  - You get hemodialysis treatment.  - You take certain medicines for conditions like cancer, organ  transplantation, and autoimmune conditions.   Hepatitis C blood testing is recommended for all people born from [birth year] through 1965 and any individual  with known risks for hepatitis C.   Practice safe sex. Use condoms and avoid high-risk sexual practices to reduce the spread of sexually transmitted infections (STIs). STIs include gonorrhea, chlamydia, syphilis, trichomonas, herpes, HPV, and human immunodeficiency virus (HIV). Herpes, HIV, and HPV are viral illnesses that have no cure. They can result in disability, cancer, and death. Sexually active women aged 25 years and younger should be checked for chlamydia. Older women with new or multiple partners should also be tested for chlamydia. Testing for other STIs is recommended if you are sexually active and at increased risk.   Osteoporosis is a disease in which the bones lose minerals and strength with aging. This can result in serious bone fractures or breaks. The risk of osteoporosis can be identified using a bone density scan. Women ages 65 years and over and women at risk for fractures or osteoporosis should discuss screening with their health care providers. Ask your health care provider whether you should take a calcium supplement or vitamin D to There are also several preventive steps women can take to avoid osteoporosis and resulting fractures or to keep osteoporosis from worsening. -->Recommendations include:  Eat a balanced diet high in fruits, vegetables, calcium, and vitamins.  Get enough calcium. The recommended total intake of is 1,200 mg daily; for best absorption, if taking supplements, divide doses into 250-500 mg doses throughout the day. Of the two types of calcium, calcium carbonate is best absorbed when taken with food but calcium citrate can be taken on an empty stomach.  Get enough vitamin D. NAMS and the National Osteoporosis Foundation recommend at least 1,000 IU per day for women age 50 and over who are at risk of vitamin D  deficiency. Vitamin D deficiency can be caused by inadequate sun exposure (for example, those who live in northern latitudes).  Avoid alcohol and smoking. Heavy alcohol intake (more than 7 drinks per week) increases the risk of falls and hip fracture and women smokers tend to lose bone more rapidly and have lower bone mass than nonsmokers. Stopping smoking is one of the most important changes women can make to improve their health and decrease risk for disease.  Be physically active every day. Weight-bearing exercise (for example, fast walking, hiking, jogging, and weight training) may strengthen bones or slow the rate of bone loss that comes with aging. Balancing and muscle-strengthening exercises can reduce the risk of falling and fracture.  Consider therapeutic medications. Currently, several types of effective drugs are available. Healthcare providers can recommend the type most appropriate for each woman.  Eliminate environmental factors that may contribute to accidents. Falls cause nearly 90% of all osteoporotic fractures, so reducing this risk is an important bone-health strategy. Measures include ample lighting, removing obstructions to walking, using nonskid rugs on floors, and placing mats and/or grab bars in showers.  Be aware of medication side effects. Some common medicines make bones weaker. These include a type of steroid drug called glucocorticoids used for arthritis and asthma, some antiseizure drugs, certain sleeping pills, treatments for endometriosis, and some cancer drugs. An overactive thyroid gland or using too much thyroid hormone for an underactive thyroid can also be a problem. If you are taking these medicines, talk to your doctor about what you can do to help protect your bones.reduce the rate of osteoporosis.    Menopause can be associated with physical symptoms and risks. Hormone replacement therapy is available to decrease symptoms and risks. You should talk to your  health care provider   about whether hormone replacement therapy is right for you.   Use sunscreen. Apply sunscreen liberally and repeatedly throughout the day. You should seek shade when your shadow is shorter than you. Protect yourself by wearing long sleeves, pants, a wide-brimmed hat, and sunglasses year round, whenever you are outdoors.   Once a month, do a whole body skin exam, using a mirror to look at the skin on your back. Tell your health care provider of new moles, moles that have irregular borders, moles that are larger than a pencil eraser, or moles that have changed in shape or color.   -Stay current with required vaccines (immunizations).   Influenza vaccine. All adults should be immunized every year.  Tetanus, diphtheria, and acellular pertussis (Td, Tdap) vaccine. Pregnant women should receive 1 dose of Tdap vaccine during each pregnancy. The dose should be obtained regardless of the length of time since the last dose. Immunization is preferred during the 27th 36th week of gestation. An adult who has not previously received Tdap or who does not know her vaccine status should receive 1 dose of Tdap. This initial dose should be followed by tetanus and diphtheria toxoids (Td) booster doses every 10 years. Adults with an unknown or incomplete history of completing a 3-dose immunization series with Td-containing vaccines should begin or complete a primary immunization series including a Tdap dose. Adults should receive a Td booster every 10 years.  Varicella vaccine. An adult without evidence of immunity to varicella should receive 2 doses or a second dose if she has previously received 1 dose. Pregnant females who do not have evidence of immunity should receive the first dose after pregnancy. This first dose should be obtained before leaving the health care facility. The second dose should be obtained 4 8 weeks after the first dose.  Human papillomavirus (HPV) vaccine. Females aged 13 26  years who have not received the vaccine previously should obtain the 3-dose series. The vaccine is not recommended for use in pregnant females. However, pregnancy testing is not needed before receiving a dose. If a female is found to be pregnant after receiving a dose, no treatment is needed. In that case, the remaining doses should be delayed until after the pregnancy. Immunization is recommended for any person with an immunocompromised condition through the age of 26 years if she did not get any or all doses earlier. During the 3-dose series, the second dose should be obtained 4 8 weeks after the first dose. The third dose should be obtained 24 weeks after the first dose and 16 weeks after the second dose.  Zoster vaccine. One dose is recommended for adults aged 60 years or older unless certain conditions are present.  Measles, mumps, and rubella (MMR) vaccine. Adults born before 1957 generally are considered immune to measles and mumps. Adults born in 1957 or later should have 1 or more doses of MMR vaccine unless there is a contraindication to the vaccine or there is laboratory evidence of immunity to each of the three diseases. A routine second dose of MMR vaccine should be obtained at least 28 days after the first dose for students attending postsecondary schools, health care workers, or international travelers. People who received inactivated measles vaccine or an unknown type of measles vaccine during 1963 1967 should receive 2 doses of MMR vaccine. People who received inactivated mumps vaccine or an unknown type of mumps vaccine before 1979 and are at high risk for mumps infection should consider immunization with 2 doses of   MMR vaccine. For females of childbearing age, rubella immunity should be determined. If there is no evidence of immunity, females who are not pregnant should be vaccinated. If there is no evidence of immunity, females who are pregnant should delay immunization until after pregnancy.  Unvaccinated health care workers born before [birth year] who lack laboratory evidence of measles, mumps, or rubella immunity or laboratory confirmation of disease should consider measles and mumps immunization with 2 doses of MMR vaccine or rubella immunization with 1 dose of MMR vaccine.  Pneumococcal 13-valent conjugate (PCV13) vaccine. When indicated, a person who is uncertain of her immunization history and has no record of immunization should receive the PCV13 vaccine. An adult aged 54 years or older who has certain medical conditions and has not been previously immunized should receive 1 dose of PCV13 vaccine. This PCV13 should be followed with a dose of pneumococcal polysaccharide (PPSV23) vaccine. The PPSV23 vaccine dose should be obtained at least 8 weeks after the dose of PCV13 vaccine. An adult aged 58 years or older who has certain medical conditions and previously received 1 or more doses of PPSV23 vaccine should receive 1 dose of PCV13. The PCV13 vaccine dose should be obtained 1 or more years after the last PPSV23 vaccine dose.  Pneumococcal polysaccharide (PPSV23) vaccine. When PCV13 is also indicated, PCV13 should be obtained first. All adults aged 58 years and older should be immunized. An adult younger than age 65 years who has certain medical conditions should be immunized. Any person who resides in a nursing home or long-term care facility should be immunized. An adult smoker should be immunized. People with an immunocompromised condition and certain other conditions should receive both PCV13 and PPSV23 vaccines. People with human immunodeficiency virus (HIV) infection should be immunized as soon as possible after diagnosis. Immunization during chemotherapy or radiation therapy should be avoided. Routine use of PPSV23 vaccine is not recommended for American Indians, [place] Natives, or people younger than 65 years unless there are medical conditions that require PPSV23 vaccine. When indicated,  people who have unknown immunization and have no record of immunization should receive PPSV23 vaccine. One-time revaccination 5 years after the first dose of PPSV23 is recommended for people aged 70 64 years who have chronic kidney failure, nephrotic syndrome, asplenia, or immunocompromised conditions. People who received 1 2 doses of PPSV23 before age 32 years should receive another dose of PPSV23 vaccine at age 96 years or later if at least 5 years have passed since the previous dose. Doses of PPSV23 are not needed for people immunized with PPSV23 at or after age 55 years.  Meningococcal vaccine. Adults with asplenia or persistent complement component deficiencies should receive 2 doses of quadrivalent meningococcal conjugate (MenACWY-D) vaccine. The doses should be obtained at least 2 months apart. Microbiologists working with certain meningococcal bacteria, [employer] recruits, people at risk during an outbreak, and people who travel to or live in countries with a high rate of meningitis should be immunized. A first-year college student up through age 58 years who is living in a residence hall should receive a dose if she did not receive a dose on or after her 16th birthday. Adults who have certain high-risk conditions should receive one or more doses of vaccine.  Hepatitis A vaccine. Adults who wish to be protected from this disease, have certain high-risk conditions, work with hepatitis A-infected animals, work in hepatitis A research labs, or travel to or work in countries with a high rate of hepatitis A should be  immunized. Adults who were previously unvaccinated and who anticipate close contact with an international adoptee during the first 60 days after arrival in the [place] from a country with a high rate of hepatitis A should be immunized.  Hepatitis B vaccine.  Adults who wish to be protected from this disease, have certain high-risk conditions, may be exposed to blood or other infectious  body fluids, are household contacts or sex partners of hepatitis B positive people, are clients or workers in certain care facilities, or travel to or work in countries with a high rate of hepatitis B should be immunized.  Haemophilus influenzae type b (Hib) vaccine. A previously unvaccinated person with asplenia or sickle cell disease or having a scheduled splenectomy should receive 1 dose of Hib vaccine. Regardless of previous immunization, a recipient of a hematopoietic stem cell transplant should receive a 3-dose series 6 12 months after her successful transplant. Hib vaccine is not recommended for adults with HIV infection.  Preventive Services / Frequency Ages 104 to 39years  Blood pressure check.** / Every 1 to 2 years.  Lipid and cholesterol check.** / Every 5 years beginning at age 28.  Clinical breast exam.** / Every 3 years for women in their 71s and 38s.  BRCA-related cancer risk assessment.** / For women who have family members with a BRCA-related cancer (breast, ovarian, tubal, or peritoneal cancers).  Pap test.** / Every 2 years from ages 59 through 67. Every 3 years starting at age 14 through age 42 or 4 with a history of 3 consecutive normal Pap tests.  HPV screening.** / Every 3 years from ages 56 through ages 70 to 8 with a history of 3 consecutive normal Pap tests.  Hepatitis C blood test.** / For any individual with known risks for hepatitis C.  Skin self-exam. / Monthly.  Influenza vaccine. / Every year.  Tetanus, diphtheria, and acellular pertussis (Tdap, Td) vaccine.** / Consult your health care provider. Pregnant women should receive 1 dose of Tdap vaccine during each pregnancy. 1 dose of Td every 10 years.  Varicella vaccine.** / Consult your health care provider. Pregnant females who do not have evidence of immunity should receive the first dose after pregnancy.  HPV vaccine. / 3 doses over 6 months, if 38 and younger. The vaccine is not recommended for use in  pregnant females. However, pregnancy testing is not needed before receiving a dose.  Measles, mumps, rubella (MMR) vaccine.** / You need at least 1 dose of MMR if you were born in 1957 or later. You may also need a 2nd dose. For females of childbearing age, rubella immunity should be determined. If there is no evidence of immunity, females who are not pregnant should be vaccinated. If there is no evidence of immunity, females who are pregnant should delay immunization until after pregnancy.  Pneumococcal 13-valent conjugate (PCV13) vaccine.** / Consult your health care provider.  Pneumococcal polysaccharide (PPSV23) vaccine.** / 1 to 2 doses if you smoke cigarettes or if you have certain conditions.  Meningococcal vaccine.** / 1 dose if you are age 18 to 52 years and a Market researcher living in a residence hall, or have one of several medical conditions, you need to get vaccinated against meningococcal disease. You may also need additional booster doses.  Hepatitis A vaccine.** / Consult your health care provider.  Hepatitis B vaccine.** / Consult your health care provider.  Haemophilus influenzae type b (Hib) vaccine.** / Consult your health care provider.  Ages 68 to 58years  Blood pressure check.** / Every 1 to 2 years.  Lipid and cholesterol check.** / Every 5 years beginning at age 20 years.  Lung cancer screening. / Every year if you are aged 55 80 years and have a 30-pack-year history of smoking and currently smoke or have quit within the past 15 years. Yearly screening is stopped once you have quit smoking for at least 15 years or develop a health problem that would prevent you from having lung cancer treatment.  Clinical breast exam.** / Every year after age 40 years.  BRCA-related cancer risk assessment.** / For women who have family members with a BRCA-related cancer (breast, ovarian, tubal, or peritoneal cancers).  Mammogram.** / Every year beginning at age 40  years and continuing for as long as you are in good health. Consult with your health care provider.  Pap test.** / Every 3 years starting at age 30 years through age 65 or 70 years with a history of 3 consecutive normal Pap tests.  HPV screening.** / Every 3 years from ages 30 years through ages 65 to 70 years with a history of 3 consecutive normal Pap tests.  Fecal occult blood test (FOBT) of stool. / Every year beginning at age 50 years and continuing until age 75 years. You may not need to do this test if you get a colonoscopy every 10 years.  Flexible sigmoidoscopy or colonoscopy.** / Every 5 years for a flexible sigmoidoscopy or every 10 years for a colonoscopy beginning at age 50 years and continuing until age 75 years.  Hepatitis C blood test.** / For all people born from 1945 through 1965 and any individual with known risks for hepatitis C.  Skin self-exam. / Monthly.  Influenza vaccine. / Every year.  Tetanus, diphtheria, and acellular pertussis (Tdap/Td) vaccine.** / Consult your health care provider. Pregnant women should receive 1 dose of Tdap vaccine during each pregnancy. 1 dose of Td every 10 years.  Varicella vaccine.** / Consult your health care provider. Pregnant females who do not have evidence of immunity should receive the first dose after pregnancy.  Zoster vaccine.** / 1 dose for adults aged 60 years or older.  Measles, mumps, rubella (MMR) vaccine.** / You need at least 1 dose of MMR if you were born in 1957 or later. You may also need a 2nd dose. For females of childbearing age, rubella immunity should be determined. If there is no evidence of immunity, females who are not pregnant should be vaccinated. If there is no evidence of immunity, females who are pregnant should delay immunization until after pregnancy.  Pneumococcal 13-valent conjugate (PCV13) vaccine.** / Consult your health care provider.  Pneumococcal polysaccharide (PPSV23) vaccine.** / 1 to 2 doses if  you smoke cigarettes or if you have certain conditions.  Meningococcal vaccine.** / Consult your health care provider.  Hepatitis A vaccine.** / Consult your health care provider.  Hepatitis B vaccine.** / Consult your health care provider.  Haemophilus influenzae type b (Hib) vaccine.** / Consult your health care provider.  Ages 65 years and over  Blood pressure check.** / Every 1 to 2 years.  Lipid and cholesterol check.** / Every 5 years beginning at age 20 years.  Lung cancer screening. / Every year if you are aged 55 80 years and have a 30-pack-year history of smoking and currently smoke or have quit within the past 15 years. Yearly screening is stopped once you have quit smoking for at least 15 years or develop a health problem that   would prevent you from having lung cancer treatment.  Clinical breast exam.** / Every year after age 103 years.  BRCA-related cancer risk assessment.** / For women who have family members with a BRCA-related cancer (breast, ovarian, tubal, or peritoneal cancers).  Mammogram.** / Every year beginning at age 36 years and continuing for as long as you are in good health. Consult with your health care provider.  Pap test.** / Every 3 years starting at age 5 years through age 85 or 10 years with 3 consecutive normal Pap tests. Testing can be stopped between 65 and 70 years with 3 consecutive normal Pap tests and no abnormal Pap or HPV tests in the past 10 years.  HPV screening.** / Every 3 years from ages 93 years through ages 70 or 45 years with a history of 3 consecutive normal Pap tests. Testing can be stopped between 65 and 70 years with 3 consecutive normal Pap tests and no abnormal Pap or HPV tests in the past 10 years.  Fecal occult blood test (FOBT) of stool. / Every year beginning at age 8 years and continuing until age 45 years. You may not need to do this test if you get a colonoscopy every 10 years.  Flexible sigmoidoscopy or colonoscopy.** /  Every 5 years for a flexible sigmoidoscopy or every 10 years for a colonoscopy beginning at age 69 years and continuing until age 68 years.  Hepatitis C blood test.** / For all people born from [birth year] through 1967 and any individual with known risks for hepatitis C.  Osteoporosis screening.** / A one-time screening for women ages 7 years and over and women at risk for fractures or osteoporosis.  Skin self-exam. / Monthly.  Influenza vaccine. / Every year.  Tetanus, diphtheria, and acellular pertussis (Tdap/Td) vaccine.** / 1 dose of Td every 10 years.  Varicella vaccine.** / Consult your health care provider.  Zoster vaccine.** / 1 dose for adults aged 5 years or older.  Pneumococcal 13-valent conjugate (PCV13) vaccine.** / Consult your health care provider.  Pneumococcal polysaccharide (PPSV23) vaccine.** / 1 dose for all adults aged 74 years and older.  Meningococcal vaccine.** / Consult your health care provider.  Hepatitis A vaccine.** / Consult your health care provider.  Hepatitis B vaccine.** / Consult your health care provider.  Haemophilus influenzae type b (Hib) vaccine.** / Consult your health care provider. ** Family history and personal history of risk and conditions may change your health care provider's recommendations. Document Released: 08/22/2001 Document Revised: 04/16/2013  [facility] Patient Information 2014 [place], [place].   EXERCISE AND DIET:  We recommended that you start or continue a regular exercise program for good health. Regular exercise means any activity that makes your heart beat faster and makes you sweat.  We recommend exercising at least 30 minutes per day at least 3 days a week, preferably 5.  We also recommend a diet low in fat and sugar / carbohydrates.  Inactivity, poor dietary choices and obesity can cause diabetes, heart attack, stroke, and kidney damage, among others.     ALCOHOL AND SMOKING:  Women should limit their alcohol intake to no  more than 7 drinks/beers/glasses of wine (combined, not each!) per week. Moderation of alcohol intake to this level decreases your risk of breast cancer and liver damage.  ( And of course, no recreational drugs are part of a healthy lifestyle.)  Also, you should not be smoking at all or even being exposed to second hand smoke. Most people know smoking can  cause cancer, and various heart and lung diseases, but did you know it also contributes to weakening of your bones?  Aging of your skin?  Yellowing of your teeth and nails?   CALCIUM AND VITAMIN D:  Adequate intake of calcium and Vitamin D are recommended.  The recommendations for exact amounts of these supplements seem to change often, but generally speaking 600 mg of calcium (either carbonate or citrate) and 800 units of Vitamin D per day seems prudent. Certain women may benefit from higher intake of Vitamin D.  If you are among these women, your doctor will have told you during your visit.     PAP SMEARS:  Pap smears, to check for cervical cancer or precancers,  have traditionally been done yearly, although recent scientific advances have shown that most women can have pap smears less often.  However, every woman still should have a physical exam from her gynecologist or primary care physician every year. It will include a breast check, inspection of the vulva and vagina to check for abnormal growths or skin changes, a visual exam of the cervix, and then an exam to evaluate the size and shape of the uterus and ovaries.  And after 50 years of age, a rectal exam is indicated to check for rectal cancers. We will also provide age appropriate advice regarding health maintenance, like when you should have certain vaccines, screening for sexually transmitted diseases, bone density testing, colonoscopy, mammograms, etc.    MAMMOGRAMS:  All women over 61 years old should have a yearly mammogram. Many facilities now offer a "3D" mammogram, which may cost  around $50 extra out of pocket. If possible,  we recommend you accept the option to have the 3D mammogram performed.  It both reduces the number of women who will be called back for extra views which then turn out to be normal, and it is better than the routine mammogram at detecting truly abnormal areas.     COLONOSCOPY:  Colonoscopy to screen for colon cancer is recommended for all women at age 51.  We know, you hate the idea of the prep.  We agree, BUT, having colon cancer and not knowing it is worse!!  Colon cancer so often starts as a polyp that can be seen and removed at colonscopy, which can quite literally save your life!  And if your first colonoscopy is normal and you have no family history of colon cancer, most women don't have to have it again for 10 years.  Once every ten years, you can do something that may end up saving your life, right?  We will be happy to help you get it scheduled when you are ready.  Be sure to check your insurance coverage so you understand how much it will cost.  It may be covered as a preventative service at no cost, but you should check your particular policy.    Overall you are doing well! To reduce your total and LDL (bad) cholesterol- reduce saturated fat, continue walking, and start practicing yoga. Continue your excellent water intake. Please schedule fasting lab appt in 4 months, complete physical in 1 year (fasting labs the week prior). Someone from Gastroenterology will call you to schedule colonoscopy. Continue to social distance and wear a mask when out in public. GREAT TO SEE YOU!

## 2019-01-29 NOTE — Progress Notes (Signed)
Subjective:    Patient ID: Pamela Lin, female    DOB: [birth year], 48 y.o.   MRN: [id number]  HPI:  Pamela Lin is here for CPE Pamela Lin estimates to drink 80-100 oz water/day Pamela Lin walks daily and continues to consider start practicing yoga 2-3 times/week Pamela Lin continues to abstain from tobacco/vape/excessive ETOH use Pamela Lin reports continued HA- 7 HA days per week HAs have been occurring >20 years Pamela Lin denies hx of migraine Pamela Lin denies visual disturbances or photophobia or N/V during HA Pamela Lin states "I think that they may be tension HAs"  Pamela Lin will occasionally treat with OTC Ibuprofen  Pamela Lin has never been evaluated by Neurology  01/15/2019 Labs: TSH-WNL, 1.850  Lipid Panel-  The 10-year ASCVD risk score Mikey Bussing DC Jr., et al., 2013) is: 1.2%  Values used to calculate the score:   Age: 53 years   Sex: Female   Is Non-Hispanic African American: No   Diabetic: No   Tobacco smoker: No   Systolic Blood Pressure: 594 mmHg   Is BP treated: No   HDL Cholesterol: 60 mg/dL   Total Cholesterol: 231 mg/dL  LDL-146  Increase in tot and LDL- need to focus on diet/exercise  A1c-WNL, 5.4  CMP-stable  CBC-stable   Healthcare Maintenance: PAP-UTD, last 01/2018-normal Mammogram-scheduled Colonoscopy-Referral to GI placed Immunizations-Pamela Lin declined Zoster  Patient Care Team    Relationship Specialty Notifications Start End  Esaw Grandchild, NP PCP - General Family Medicine  10/12/16     Patient Active Problem List   Diagnosis Date Noted  . HA (headache) 01/29/2019  . Elevated LDL cholesterol level 01/29/2019  . Maxillary sinusitis 06/27/2018  . Screening for cervical cancer 02/05/2018  . Acute frontal sinusitis 02/27/2017  . Healthcare maintenance 10/12/2016  . Obesity (BMI 30-39.9) 10/12/2016  . History of environmental allergies 10/12/2016     Past Medical History:  Diagnosis Date  . Allergy      Past Surgical History:  Procedure Laterality Date  .  ADENOIDECTOMY    . CHOLECYSTECTOMY       Family History  Problem Relation Age of Onset  . Cancer Mother        breast  . Breast cancer Mother   . Cancer Father        colon  . Cancer Maternal Aunt        breast  . Breast cancer Maternal Aunt   . Cancer Paternal Aunt        breast  . Breast cancer Paternal Aunt   . Cancer Maternal Grandmother        breast  . Breast cancer Maternal Grandmother   . Cancer Paternal Grandmother        breast  . Breast cancer Paternal Grandmother      Social History   Substance and Sexual Activity  Drug Use No     Social History   Substance and Sexual Activity  Alcohol Use Yes   Comment: socially     Social History   Tobacco Use  Smoking Status Never Smoker  Smokeless Tobacco Never Used     Outpatient Encounter Medications as of 01/29/2019  Medication Sig  . azelastine (ASTELIN) 0.1 % nasal spray Place 2 sprays into both nostrils as needed for rhinitis. Use in each nostril as directed  . fluticasone (FLONASE) 50 MCG/ACT nasal spray Place 2 sprays into both nostrils as needed for allergies or rhinitis.  . Multiple Vitamin (MULTIVITAMIN) tablet Take 1 tablet by mouth daily.   No facility-administered encounter  medications on file as of 01/29/2019.     Allergies: Dust mite extract, Grass extracts [gramineae pollens], and Other  Body mass index is 33.4 kg/m.  Blood pressure 122/72, pulse (!) 57, temperature 98.5 F (36.9 C), temperature source Oral, height 5' 6.25" (1.683 m), weight 208 lb 8 oz (94.6 kg), SpO2 100 %.  Review of Systems  Constitutional: Positive for fatigue. Negative for activity change, appetite change, chills, diaphoresis, fever and unexpected weight change.  HENT: Negative for congestion.   Eyes: Negative for visual disturbance.  Respiratory: Negative for cough, chest tightness, shortness of breath, wheezing and stridor.   Cardiovascular: Negative for chest pain, palpitations and leg swelling.   Gastrointestinal: Negative for abdominal distention, abdominal pain, blood in stool, constipation, diarrhea, nausea and vomiting.  Endocrine: Negative for cold intolerance, heat intolerance, polydipsia, polyphagia and polyuria.  Genitourinary: Negative for difficulty urinating and flank pain.  Musculoskeletal: Positive for myalgias. Negative for arthralgias, back pain, gait problem, joint swelling, neck pain and neck stiffness.  Skin: Negative for color change, pallor, rash and wound.  Neurological: Negative for dizziness and headaches.  Hematological: Negative for adenopathy. Does not bruise/bleed easily.  Psychiatric/Behavioral: Negative for agitation, behavioral problems, confusion, decreased concentration, dysphoric mood, hallucinations, self-injury, sleep disturbance and suicidal ideas. The patient is not nervous/anxious and is not hyperactive.        Objective:   Physical Exam Vitals signs and nursing note reviewed.  Constitutional:      General: Pamela Lin is not in acute distress.    Appearance: Pamela Lin is obese. Pamela Lin is not ill-appearing, toxic-appearing or diaphoretic.  HENT:     Head: Normocephalic and atraumatic.     Right Ear: Tympanic membrane, ear canal and external ear normal. There is no impacted cerumen.     Left Ear: Ear canal and external ear normal. There is no impacted cerumen.     Nose: No congestion.     Mouth/Throat:     Mouth: Mucous membranes are moist.     Pharynx: No oropharyngeal exudate.  Eyes:     Extraocular Movements: Extraocular movements intact.     Conjunctiva/sclera: Conjunctivae normal.     Pupils: Pupils are equal, round, and reactive to light.  Neck:     Musculoskeletal: Normal range of motion. No muscular tenderness.  Cardiovascular:     Rate and Rhythm: Normal rate and regular rhythm.     Pulses: Normal pulses.     Heart sounds: Normal heart sounds. No murmur. No friction rub. No gallop.   Pulmonary:     Effort: Pulmonary effort is normal. No  respiratory distress.     Breath sounds: Normal breath sounds. No stridor. No wheezing, rhonchi or rales.  Chest:     Chest wall: No tenderness.  Abdominal:     General: Abdomen is protuberant. Bowel sounds are normal.     Palpations: Abdomen is soft.     Tenderness: There is no abdominal tenderness. There is no right CVA tenderness, left CVA tenderness, guarding or rebound.  Musculoskeletal: Normal range of motion.        General: No swelling or tenderness.     Comments: R grip strength is a little weaker than L grip strength   Skin:    General: Skin is warm and dry.     Capillary Refill: Capillary refill takes less than 2 seconds.  Neurological:     Mental Status: Pamela Lin is alert and oriented to person, place, and time.  Psychiatric:  Mood and Affect: Mood normal.        Behavior: Behavior normal.        Thought Content: Thought content normal.        Judgment: Judgment normal.        Assessment & Plan:   1. Screening for colon cancer   2. Elevated LDL cholesterol level   3. Healthcare maintenance   4. Obesity (BMI 30-39.9)   5. Chronic nonintractable headache, unspecified headache type     Healthcare maintenance Overall you are doing well! To reduce your total and LDL (bad) cholesterol- reduce saturated fat, continue walking, and start practicing yoga. Continue your excellent water intake. Please schedule fasting lab appt in 4 months, complete physical in 1 year (fasting labs the week prior). Someone from Gastroenterology will call you to schedule colonoscopy. Continue to social distance and wear a mask when out in public.  Obesity (BMI 30-39.9) Body mass index is 33.4 kg/m. Current wt 208 Increase regular exercise, reduce saturated fat in diet   HA (headache) Pamela Lin reports continued HA- 7 HA days per week HAs have been occurring >20 years Pamela Lin denies hx of migraine Pamela Lin denies visual disturbances or photophobia or N/V during HA Pamela Lin states "I think that they  may be tension HAs"  Pamela Lin will occasionally treat with OTC Ibuprofen  Pamela Lin has never been evaluated by Neurology   Elevated LDL cholesterol level 01/15/2019 The 10-year ASCVD risk score Mikey Bussing DC Jr., et al., 2013) is: 1.2%  Values used to calculate the score:   Age: 49 years   Sex: Female   Is Non-Hispanic African American: No   Diabetic: No   Tobacco smoker: No   Systolic Blood Pressure: 557 mmHg   Is BP treated: No   HDL Cholesterol: 60 mg/dL   Total Cholesterol: 231 mg/dL  LDL-146  Increase in tot and LDL- need to focus on diet/exercise  Will re-check in 4 months Her father has CAD- CABG x 3    FOLLOW-UP:  Return in about 4 months (around 06/01/2019) for Fasting Labs.

## 2019-02-02 NOTE — Progress Notes (Signed)
Virtual Visit via Video Note The purpose of this virtual visit is to provide medical care while limiting exposure to the novel coronavirus.    Consent was obtained for video visit:  yes Answered questions that patient had about telehealth interaction:  yes I discussed the limitations, risks, security and privacy concerns of performing an evaluation and management service by telemedicine. I also discussed with the patient that there may be a patient responsible charge related to this service. The patient expressed understanding and agreed to proceed.  Pt location: Home Physician Location: Home Name of referring provider:  Esaw Grandchild, NP I connected with Pamela Lin at patients initiation/request on 02/03/2019 at  1:50 PM EDT by video enabled telemedicine application and verified that I am speaking with the correct person using two identifiers. Pt MRN:  [id number] Pt DOB:  April 28, 1971 Video Participants:  Pamela Lin   History of Present Illness:  Pamela Lin is a 48 year old female presenting with headaches.  History supplemented by referring provider note.  Onset:  Childhood but more prominent in college Location:  bifrontal Quality:  Non-throbbing.  Occasional throbbing. Intensity:  Dull.  She denies new headache, thunderclap headache or severe headache that wakes her from sleep. Aura:  no Premonitory Phase:  none Postdrome:  none Associated symptoms:  None.  She denies associated nausea, vomiting, photophobia, phonophobia, osmophobia, autonomic symptoms, visual disturbance or unilateral numbness or weakness. Duration:  Persistent, more intense headaches occur until falls asleep Frequency:  Persistent, more intense headaches occur every 2 weeks Frequency of abortive medication: Advil or Aleve every few weeks. Exacerbating factors: sleep depravation, back pain, emotional stress Relieving factors:  Laying down to rest, cold compress on forehead Activity:  Does not aggravate.  She does have chronic allergic rhinitis as well.  Current NSAIDS:  Aleve or Advil Current analgesics:  none Current triptans:  none Current ergotamine:  none Current anti-emetic:  none Current muscle relaxants:  none Current anti-anxiolytic:  none Current sleep aide:  none Current Antihypertensive medications:  none Current Antidepressant medications:  none Current Anticonvulsant medications:  none Current anti-CGRP:  none Current Vitamins/Herbal/Supplements:  MVI Current Antihistamines/Decongestants:  Astelin, Flonase (at most once a week) Other therapy:  none Hormone/birth control:  none  Past NSAIDS:  none Past analgesics:  Tylenol, Excedrin (effective) Past abortive triptans:  none Past abortive ergotamine:  none Past muscle relaxants:  none Past anti-emetic:  none Past antihypertensive medications:  none Past antidepressant medications:  none Past anticonvulsant medications:  May have tried topiramate once (stopped due to paresthesias) Past anti-CGRP:  none Past vitamins/Herbal/Supplements:  none Past antihistamines/decongestants:  Claritin, Zyrtec, Allegra Other past therapies:  no  Caffeine:  1 cup coffee daily Diet:  80 to 100 oz of water.  No soda Exercise:  Not routine Depression:  no; Anxiety:  Some stress Other pain:  Hand pain.  She has low back pain. Sleep hygiene:  OK.  Averages 7 hours a day. No history of head injury.  No chronic neck pain.   Family history of headache:  Mom, dad, brother (migraines)  CBC and CMP from 01/15/19 were normal.  Past Medical History: Past Medical History:  Diagnosis Date  . Allergy     Medications: Outpatient Encounter Medications as of 02/03/2019  Medication Sig  . azelastine (ASTELIN) 0.1 % nasal spray Place 2 sprays into both nostrils as needed for rhinitis. Use in each nostril as directed  . fluticasone (FLONASE) 50 MCG/ACT nasal spray Place 2 sprays into both  nostrils as needed for allergies or rhinitis.  .  Multiple Vitamin (MULTIVITAMIN) tablet Take 1 tablet by mouth daily.   No facility-administered encounter medications on file as of 02/03/2019.     Allergies: Allergies  Allergen Reactions  . Dust Mite Extract   . Grass Extracts [Gramineae Pollens]   . Other     Weed pollen, mold    Family History: Family History  Problem Relation Age of Onset  . Cancer Mother        breast  . Breast cancer Mother   . Cancer Father        colon  . Cancer Maternal Aunt        breast  . Breast cancer Maternal Aunt   . Cancer Paternal Aunt        breast  . Breast cancer Paternal Aunt   . Cancer Maternal Grandmother        breast  . Breast cancer Maternal Grandmother   . Cancer Paternal Grandmother        breast  . Breast cancer Paternal Grandmother     Social History: Social History   Socioeconomic History  . Marital status: Married    Spouse name: Not on file  . Number of children: Not on file  . Years of education: Not on file  . Highest education level: Not on file  Occupational History  . Not on file  Social Needs  . Financial resource strain: Not on file  . Food insecurity    Worry: Not on file    Inability: Not on file  . Transportation needs    Medical: Not on file    Non-medical: Not on file  Tobacco Use  . Smoking status: Never Smoker  . Smokeless tobacco: Never Used  Substance and Sexual Activity  . Alcohol use: Yes    Comment: socially  . Drug use: No  . Sexual activity: Not Currently    Birth control/protection: None  Lifestyle  . Physical activity    Days per week: Not on file    Minutes per session: Not on file  . Stress: Not on file  Relationships  . Social Herbalist on phone: Not on file    Gets together: Not on file    Attends religious service: Not on file    Active member of club or organization: Not on file    Attends meetings of clubs or organizations: Not on file    Relationship status: Not on file  . Intimate partner violence     Fear of current or ex partner: Not on file    Emotionally abused: Not on file    Physically abused: Not on file    Forced sexual activity: Not on file  Other Topics Concern  . Not on file  Social History Narrative  . Not on file    Observations/Objective:   Height 5' 5.34" (1.66 m), weight 208 lb (94.3 kg). No acute distress.  Alert and oriented.  Speech fluent and not dysarthric.  Language intact.  Eyes orthophoric on primary gaze.  Face symmetric.  Assessment and Plan:   Probable chronic tension-type headache, intractable  1.  For preventative management, start nortriptyline 10mg  at bedtime.  May increase dose to 25mg  at bedtime in 4 weeks if needed. 2.  For abortive therapy, Advil or Aleve 3.  Limit use of pain relievers to no more than 2 days out of week to prevent risk of rebound or medication-overuse headache. 4.  Keep headache diary 5.  Exercise, hydration, caffeine cessation, sleep hygiene, monitor for and avoid triggers 6.  Follow up in 4 months.  Follow Up Instructions:    -I discussed the assessment and treatment plan with the patient. The patient was provided an opportunity to ask questions and all were answered. The patient agreed with the plan and demonstrated an understanding of the instructions.   The patient was advised to call back or seek an in-person evaluation if the symptoms worsen or if the condition fails to improve as anticipated.   Dudley Major, DO

## 2019-02-03 ENCOUNTER — Telehealth (INDEPENDENT_AMBULATORY_CARE_PROVIDER_SITE_OTHER): Payer: BC Managed Care – PPO | Admitting: Neurology

## 2019-02-03 ENCOUNTER — Encounter: Payer: Self-pay | Admitting: Neurology

## 2019-02-03 ENCOUNTER — Other Ambulatory Visit: Payer: Self-pay

## 2019-02-03 VITALS — Ht 65.34 in | Wt 208.0 lb

## 2019-02-03 DIAGNOSIS — G44221 Chronic tension-type headache, intractable: Secondary | ICD-10-CM | POA: Diagnosis not present

## 2019-02-03 MED ORDER — NORTRIPTYLINE HCL 10 MG PO CAPS: 10.0000 mg | ORAL_CAPSULE | Freq: Every day | ORAL | 3 refills | Status: DC

## 2019-02-03 NOTE — Patient Instructions (Signed)
1.  Start nortriptyline 10mg  at bedtime.  If headaches not improved in 4 weeks, contact me and we can increase dose. 2. Limit use of pain relievers to no more than 2 days out of week to prevent risk of rebound or medication-overuse headache. 3.  Keep headache diary 4.  Exercise, hydration, caffeine cessation, sleep hygiene, monitor for and avoid triggers 5.  Follow up in 4 months

## 2019-02-20 ENCOUNTER — Other Ambulatory Visit: Payer: Self-pay

## 2019-02-20 ENCOUNTER — Ambulatory Visit
Admission: RE | Admit: 2019-02-20 | Discharge: 2019-02-20 | Disposition: A | Discharge status: Home / Self Care | Payer: BC Managed Care – PPO | Source: Ambulatory Visit | Attending: Adult Health | Admitting: Adult Health

## 2019-02-20 DIAGNOSIS — Z1231 Encounter for screening mammogram for malignant neoplasm of breast: Secondary | ICD-10-CM

## 2019-02-28 ENCOUNTER — Encounter: Payer: Self-pay | Admitting: Adult Health

## 2019-03-05 ENCOUNTER — Telehealth: Payer: Self-pay | Admitting: Neurology

## 2019-03-05 NOTE — Telephone Encounter (Signed)
Patient is needing a refill on the Nortriptyline - she said that she is maybe needing an increase dose because she said its helping a little but not much. Please send this to [facility]. Thanks!

## 2019-03-06 ENCOUNTER — Other Ambulatory Visit: Payer: Self-pay | Admitting: Neurology

## 2019-03-06 MED ORDER — NORTRIPTYLINE HCL 25 MG PO CAPS: 25.0000 mg | ORAL_CAPSULE | Freq: Every day | ORAL | 3 refills | Status: DC

## 2019-03-06 NOTE — Progress Notes (Signed)
Increased nortriptyline to 25mg  at bedtime.

## 2019-03-06 NOTE — Telephone Encounter (Signed)
Today, I had refilled and increased dose from 10mg  to 25mg  at bedtime.  She should be getting a call from the pharmacy for pickup.

## 2019-03-06 NOTE — Telephone Encounter (Signed)
Can you change the strength and send in new rx.

## 2019-04-02 ENCOUNTER — Other Ambulatory Visit: Payer: Self-pay | Admitting: Neurology

## 2019-04-02 MED ORDER — NORTRIPTYLINE HCL 50 MG PO CAPS: 50.0000 mg | ORAL_CAPSULE | Freq: Every day | ORAL | 2 refills | Status: DC

## 2019-05-01 MED ORDER — TROKENDI XR 25 MG PO CP24: 1.0000 | ORAL_CAPSULE | Freq: Every day | ORAL | 0 refills | Status: DC

## 2019-05-01 MED ORDER — TROKENDI XR 50 MG PO CP24: ORAL_CAPSULE | ORAL | 1 refills | Status: DC

## 2019-05-01 MED ORDER — TROKENDI XR 25 MG PO CP24: ORAL_CAPSULE | ORAL | 0 refills | Status: DC

## 2019-05-01 MED ORDER — TROKENDI XR 50 MG PO CP24: ORAL_CAPSULE | ORAL | 0 refills | Status: DC

## 2019-05-01 NOTE — Telephone Encounter (Signed)
Pharmacy called requesting seprate Rx for 25 mg. Rx comes in a pack unable to split.  rx sent for 25 mg of Trokendi xr #30 due to pack

## 2019-05-01 NOTE — Telephone Encounter (Signed)
We can continue to increase nortriptyline to 75mg  at bedtime with update in 4 weeks.  However, if she thinks the side effects outweighs the potential benefits, we can switch to another medication.  We can start Trokendi XR 25mg  at bedtime for 1 week, then increase to 50mg  at bedtime.  It is an extended release form of topiramate which less likely causes paresthesias (she said she previously tried topiramate but it caused paresthesias).  It will need prior authorization, however with commercial insurance, she should still be able to get it if she activates a copay card.  If she is able to come to the office, we can provide her a week of samples of the 25mg  Trokendi and give her the card and we can send in a prescription for the 50mg  pill (1 capsule at bedtime).

## 2019-05-01 NOTE — Addendum Note (Signed)
Addended by: Ranae Plumber on: 05/01/2019 10:27 AM   Modules accepted: Orders

## 2019-05-27 ENCOUNTER — Other Ambulatory Visit: Payer: Self-pay | Admitting: Neurology

## 2019-05-27 MED ORDER — TROKENDI XR 100 MG PO CP24: 100.0000 mg | ORAL_CAPSULE | Freq: Every day | ORAL | 5 refills | Status: DC

## 2019-05-28 ENCOUNTER — Other Ambulatory Visit: Payer: Self-pay

## 2019-05-28 ENCOUNTER — Other Ambulatory Visit: Payer: BC Managed Care – PPO

## 2019-05-28 DIAGNOSIS — E7800 Pure hypercholesterolemia, unspecified: Secondary | ICD-10-CM

## 2019-05-29 LAB — LIPID PANEL
Chol/HDL Ratio: 3.6 ratio (ref 0.0–4.4)
Cholesterol, Total: 222 mg/dL — ABNORMAL HIGH (ref 100–199)
HDL: 61 mg/dL (ref >39)
LDL Chol Calc (NIH): 143 mg/dL — ABNORMAL HIGH (ref 0–99)
Triglycerides: 103 mg/dL (ref 0–149)
VLDL Cholesterol Cal: 18 mg/dL (ref 5–40)

## 2019-06-10 ENCOUNTER — Ambulatory Visit: Payer: BC Managed Care – PPO | Admitting: Neurology

## 2019-06-23 ENCOUNTER — Other Ambulatory Visit: Payer: Self-pay | Admitting: Neurology

## 2019-06-23 MED ORDER — SERTRALINE HCL 25 MG PO TABS: 25.0000 mg | ORAL_TABLET | Freq: Every day | ORAL | 0 refills | Status: DC

## 2019-06-23 MED ORDER — TOPIRAMATE ER 50 MG PO CAP24: 50.0000 mg | ORAL_CAPSULE | Freq: Every day | ORAL | 0 refills | Status: DC

## 2019-06-26 ENCOUNTER — Ambulatory Visit: Payer: BC Managed Care – PPO | Admitting: Allergy

## 2019-08-19 ENCOUNTER — Other Ambulatory Visit: Payer: Self-pay | Admitting: Neurology

## 2019-08-19 MED ORDER — SERTRALINE HCL 100 MG PO TABS: 100.0000 mg | ORAL_TABLET | Freq: Every day | ORAL | 3 refills | Status: DC

## 2019-08-26 ENCOUNTER — Other Ambulatory Visit: Payer: Self-pay | Admitting: Neurology

## 2019-08-26 MED ORDER — TIZANIDINE HCL 2 MG PO TABS: ORAL_TABLET | ORAL | 3 refills | Status: DC

## 2019-09-19 NOTE — Progress Notes (Signed)
NEUROLOGY FOLLOW UP OFFICE NOTE  Pamela Lin YE:[id number]  HISTORY OF PRESENT ILLNESS: Pamela Lin is a 49 year old female who follows up for headaches.  UPDATE: Since initial consult in July, she has tried and failed several preventative regimens including: - Nortriptyline 50mg  at bedtime.  Ineffective and caused changes in mood. - Trokendi XR 100mg  at bedtime.  Ineffective. - Sertraline 100mg  daily.  Ineffective.  Increasing dose to 100mg  caused increased headaches. - Sertraline 50mg  daily with tizanidine 2mg  three times daily.  Ineffective.  She is essentially still with daily headaches, no change since last visit.  She now thinks that on a couple of occasions the headache was more intense over her eye.    Current NSAIDS:  Aleve or Advil Current analgesics:  none Current triptans:  none Current ergotamine:  none Current anti-emetic:  none Current muscle relaxants:  tizanidine 2mg  three times daily. Current anti-anxiolytic:  none Current sleep aide:  none Current Antihypertensive medications:  none Current Antidepressant medications:  sertraline 50mg  daily Current Anticonvulsant medications:  none Current anti-CGRP:  none Current Vitamins/Herbal/Supplements:  MVI Current Antihistamines/Decongestants:  Astelin, Flonase (at most once a week) Other therapy:  none Hormone/birth control:  none  Caffeine:  1 cup coffee daily Diet:  80 to 100 oz of water.  No soda Exercise:  Not routine Depression:  no; Anxiety:  Some stress Other pain:  Hand pain.  She has low back pain. Sleep hygiene:  OK.  Averages 7 hours a day.  HISTORY: Onset:  Childhood but more prominent in college Location:  bifrontal Quality:  Non-throbbing.  Occasional throbbing. Intensity:  Dull.  She denies new headache, thunderclap headache or severe headache that wakes her from sleep. Aura:  no Premonitory Phase:  none Postdrome:  none Associated symptoms:  None.  She denies associated nausea,  vomiting, photophobia, phonophobia, osmophobia, autonomic symptoms, visual disturbance or unilateral numbness or weakness. Duration:  Persistent, more intense headaches occur until falls asleep Frequency:  Persistent, more intense headaches occur every 2 weeks Frequency of abortive medication: Advil or Aleve every few weeks. Exacerbating factors: sleep depravation, back pain, emotional stress Relieving factors:  Laying down to rest, cold compress on forehead Activity:  Does not aggravate. She does have chronic allergic rhinitis as well.  Past NSAIDS:  none Past analgesics:  Tylenol, Excedrin (effective) Past abortive triptans:  none Past abortive ergotamine:  none Past muscle relaxants:  none Past anti-emetic:  none Past antihypertensive medications:  none Past antidepressant medications:  nortriptyline 50mg  (negatively altered mood) Past anticonvulsant medications:  Topiramate ER 100mg  Past anti-CGRP:  none Past vitamins/Herbal/Supplements:  none Past antihistamines/decongestants:  Claritin, Zyrtec, Allegra Other past therapies:  no   No history of head injury.  No chronic neck pain.   Family history of headache:  Mom, dad, brother (migraines)  PAST MEDICAL HISTORY: Past Medical History:  Diagnosis Date  . Allergy     MEDICATIONS: Current Outpatient Medications on File Prior to Visit  Medication Sig Dispense Refill  . azelastine (ASTELIN) 0.1 % nasal spray Place 2 sprays into both nostrils as needed for rhinitis. Use in each nostril as directed 30 mL 3  . fluticasone (FLONASE) 50 MCG/ACT nasal spray Place 2 sprays into both nostrils as needed for allergies or rhinitis. 16 g 3  . Multiple Vitamin (MULTIVITAMIN) tablet Take 1 tablet by mouth daily.    . sertraline (ZOLOFT) 100 MG tablet Take 1 tablet (100 mg total) by mouth daily. 30 tablet 3  . tiZANidine (  ZANAFLEX) 2 MG tablet Take 1 tablet at bedtime for a week, then 1 tablet twice daily for a week, then 1 tablet three  times daily. 90 tablet 3  . Topiramate ER (TROKENDI XR) 50 MG CP24 Take 50 mg by mouth at bedtime. 7 capsule 0   No current facility-administered medications on file prior to visit.    ALLERGIES: Allergies  Allergen Reactions  . Dust Mite Extract   . Grass Extracts [Gramineae Pollens]   . Other     Weed pollen, mold    FAMILY HISTORY: Family History  Problem Relation Age of Onset  . Cancer Mother        breast  . Breast cancer Mother   . Cancer Father        colon  . Cancer Maternal Aunt        breast  . Breast cancer Maternal Aunt   . Cancer Paternal Aunt        breast  . Breast cancer Paternal Aunt   . Cancer Maternal Grandmother        breast  . Breast cancer Maternal Grandmother   . Cancer Paternal Grandmother        breast  . Breast cancer Paternal Grandmother    SOCIAL HISTORY: Social History   Socioeconomic History  . Marital status: Married    Spouse name: [name]  . Number of children: 0  . Years of education: Not on file  . Highest education level: Master's degree (e.g., MA, MS, MEng, MEd, MSW, MBA)  Occupational History    Employer: [employer]  Tobacco Use  . Smoking status: Never Smoker  . Smokeless tobacco: Never Used  Substance and Sexual Activity  . Alcohol use: Yes    Comment: socially  . Drug use: No  . Sexual activity: Not Currently    Birth control/protection: None  Other Topics Concern  . Not on file  Social History Narrative   Patient is right-handed. She lives with her husband in a one level home. She drinks one cup of coffee a day. She does not exercise.   Social Determinants of Health   Financial Resource Strain:   . Difficulty of Paying Living Expenses:   Food Insecurity:   . Worried About Charity fundraiser in the Last Year:   . Arboriculturist in the Last Year:   Transportation Needs:   . Film/video editor (Medical):   Marland Kitchen Lack of Transportation (Non-Medical):   Physical Activity:   . Days of Exercise per  Week:   . Minutes of Exercise per Session:   Stress:   . Feeling of Stress :   Social Connections:   . Frequency of Communication with Friends and Family:   . Frequency of Social Gatherings with Friends and Family:   . Attends Religious Services:   . Active Member of Clubs or Organizations:   . Attends Archivist Meetings:   Marland Kitchen Marital Status:   Intimate Partner Violence:   . Fear of Current or Ex-Partner:   . Emotionally Abused:   Marland Kitchen Physically Abused:   . Sexually Abused:     REVIEW OF SYSTEMS: Constitutional: No fevers, chills, or sweats, no generalized fatigue, change in appetite Eyes: No visual changes, double vision, eye pain Ear, nose and throat: No hearing loss, ear pain, nasal congestion, sore throat Cardiovascular: No chest pain, palpitations Respiratory:  No shortness of breath at rest or with exertion, wheezes GastrointestinaI: No nausea, vomiting, diarrhea, abdominal  pain, fecal incontinence Genitourinary:  No dysuria, urinary retention or frequency Musculoskeletal:  No neck pain, back pain Integumentary: No rash, pruritus, skin lesions Neurological: as above Psychiatric: No depression, insomnia, anxiety Endocrine: No palpitations, fatigue, diaphoresis, mood swings, change in appetite, change in weight, increased thirst Hematologic/Lymphatic:  No purpura, petechiae. Allergic/Immunologic: no itchy/runny eyes, nasal congestion, recent allergic reactions, rashes  PHYSICAL EXAM: Blood pressure 122/79, pulse 65, height 5\' 6"  (1.676 m), weight 202 lb (91.6 kg), SpO2 100 %. General: No acute distress.  Patient appears well-groomed.   Head:  Normocephalic/atraumatic Eyes:  Fundi examined but not visualized Neck: supple, no paraspinal tenderness, full range of motion Heart:  Regular rate and rhythm Lungs:  Clear to auscultation bilaterally Back: No paraspinal tenderness Neurological Exam: alert and oriented to person, place, and time. Attention span and  concentration intact, recent and remote memory intact, fund of knowledge intact.  Speech fluent and not dysarthric, language intact.  CN II-XII intact. Bulk and tone normal, muscle strength 5/5 throughout.  Sensation to light touch, temperature and vibration intact.  Deep tendon reflexes 2+ throughout, toes downgoing.  Finger to nose and heel to shin testing intact.  Gait normal, Romberg negative.  IMPRESSION: 1.  Chronic tension-type headaches, without status migrainosus, intractable.  Still with daily headaches despite multiple treatments.  I think brain imaging is warranted.  PLAN: 1.  She will discontinue sertraline and tizanidine.  Instead, we will start Cymbalta 30mg  daily.  If headaches not improved in 6 weeks, we can increase to 60mg  daily. 2.  If there is no improvement in headaches when she checks in with me in 6 weeks, I will also check MRI of brain as she has continued to have daily persistent headache despite multiple medical treatments. 3.  Limit use of pain relievers to no more than 2 days out of week to prevent risk of rebound or medication-overuse headache. 4.  Keep headache diary 5.  Follow up in 4 months.  Metta Clines, DO  CC: Mina Marble, NP

## 2019-09-22 ENCOUNTER — Other Ambulatory Visit: Payer: Self-pay

## 2019-09-22 ENCOUNTER — Ambulatory Visit (INDEPENDENT_AMBULATORY_CARE_PROVIDER_SITE_OTHER): Payer: BC Managed Care – PPO | Admitting: Neurology

## 2019-09-22 ENCOUNTER — Encounter: Payer: Self-pay | Admitting: Neurology

## 2019-09-22 VITALS — BP 122/79 | HR 65 | Ht 66.0 in | Wt 202.0 lb

## 2019-09-22 DIAGNOSIS — G44221 Chronic tension-type headache, intractable: Secondary | ICD-10-CM | POA: Diagnosis not present

## 2019-09-22 MED ORDER — DULOXETINE HCL 30 MG PO CPEP: 30.0000 mg | ORAL_CAPSULE | Freq: Every day | ORAL | 0 refills | Status: DC

## 2019-09-22 NOTE — Patient Instructions (Signed)
1.  Stop sertraline and tizanidine. 2.  Start duloxetine 30mg  daily.  Contact me in 5 1/2 weeks with update and we can either continue dose or increase.   3.  Limit use of pain relievers to no more than 2 days out of week to prevent risk of rebound or medication-overuse headache. 4.  Keep headache diary 5.  Follow up in 4 months.

## 2019-11-04 ENCOUNTER — Telehealth: Payer: Self-pay | Admitting: Neurology

## 2019-11-04 ENCOUNTER — Other Ambulatory Visit: Payer: Self-pay

## 2019-11-04 DIAGNOSIS — G44221 Chronic tension-type headache, intractable: Secondary | ICD-10-CM

## 2019-11-04 NOTE — Progress Notes (Signed)
Per Dr. Tomi Likens Order for Mri of the Brain W W/O Contrast and A Referral to the a Headache specialist done.   Pt aware of Orders added.

## 2019-11-04 NOTE — Telephone Encounter (Signed)
I called and spoke to Pamela Lin about the plan.  Will order MRI of brain.  I will refer her to a headache specialist (advised should be scheduled after MRI completed).  She may discontinue Cymbalta.  She is agreeable to plan.

## 2019-11-12 ENCOUNTER — Other Ambulatory Visit: Payer: Self-pay | Admitting: Adult Health

## 2019-11-12 DIAGNOSIS — Z1231 Encounter for screening mammogram for malignant neoplasm of breast: Secondary | ICD-10-CM

## 2019-12-12 ENCOUNTER — Telehealth: Payer: Self-pay | Admitting: Physician Assistant

## 2019-12-12 DIAGNOSIS — Z0000 Encounter for general adult medical examination without abnormal findings: Secondary | ICD-10-CM

## 2019-12-12 DIAGNOSIS — E7800 Pure hypercholesterolemia, unspecified: Secondary | ICD-10-CM

## 2019-12-12 NOTE — Telephone Encounter (Signed)
Future labs ordered. AS, CMA

## 2019-12-12 NOTE — Telephone Encounter (Signed)
Patient called to schedule annual CPE unsure if labs needed --hasn't been seen since 2020-- are labs order needed for CPE?   --forwarding note to med asst for chart review & order addition if necessary (pls advise because pt wishes to know if Labs needed)  --glh

## 2019-12-15 ENCOUNTER — Other Ambulatory Visit: Payer: Self-pay

## 2019-12-15 ENCOUNTER — Telehealth: Payer: Self-pay

## 2019-12-15 ENCOUNTER — Ambulatory Visit
Admission: RE | Admit: 2019-12-15 | Discharge: 2019-12-15 | Disposition: A | Discharge status: Home / Self Care | Payer: BC Managed Care – PPO | Source: Ambulatory Visit | Attending: Neurology | Admitting: Neurology

## 2019-12-15 DIAGNOSIS — G44221 Chronic tension-type headache, intractable: Secondary | ICD-10-CM

## 2019-12-15 MED ORDER — GADOBENATE DIMEGLUMINE 529 MG/ML IV SOLN
18.0000 mL | Freq: Once | INTRAVENOUS | Status: AC | PRN
  Administered 2019-12-15: 18 mL via INTRAVENOUS

## 2019-12-15 NOTE — Telephone Encounter (Signed)
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  It is benign and incidental finding.  MRI of brain does not show anything concerning that would be causing headaches.

## 2019-12-15 NOTE — Telephone Encounter (Signed)
Pt advised of MRI Results.

## 2019-12-15 NOTE — Telephone Encounter (Signed)
LMOVM at 4:07pm

## 2019-12-23 NOTE — Progress Notes (Signed)
[facility]    Provider:  Dr Jaynee Eagles Requesting Provider: Pieter Partridge, DO Primary Care Provider:  Patient, No Pcp Per  CC:  migraines  HPI:  Pamela Lin is a 49 y.o. female here as requested by Pieter Partridge, DO for migraines.  Pamela Lin has a long history of daily headaches, she cannot remember when she did not have them so it has been years decades probably.  She has them so much she gets used to it.  She has been on 7-8 or more medications per her neurologist, they would help for a little bit but not completely, she had an MRI, no causes of headaches, saw an incidental benign pituitary microadenoma.Started years or decades ago. No inciting events. They are across the forehead and more on one side than the other. Can be more unilateral. Pulsating/throbbing/pounding. She has daily headaches.8 migraine days a month. Can have photophobia/phonophobi/smells, nausea. Laying down in a dark quiet room helps, she can't watch TV, sleep deprivation can trigger, she has allergy problems and that was make them worse, ibuprofen may help a little but the other medications she tried didn't seem to help long term. Migraines can last 24-72 hours and be moderately severe to severe. It is affecting her life and work. No symptoms of sleep apnea. No hx of head trauma. Brother with migraines, mother had headaches. She has dizziness, numbness and tingling, no aura, no medication overuse, No other focal neurologic deficits, associated symptoms, inciting events or modifiable factors.   Reviewed notes, labs and imaging from outside physicians, which showed:   From a review of records, medications that Pamela Lin has tried not tolerated or failed, that can be used in migraine management include: Cymbalta, nortriptyline 50 mg (ineffective and cause changes in mood), Trokendi 100 mg at bedtime it was ineffective, sertraline 100 mg daily ineffective and increasing dose caused increased headaches,  sertraline 50 mg daily with tizanidine 2 mg 3 times daily ineffective, Aleve/Advil.  No significant caffeine usage, no soda, appears to drink adequate water 80 to 100 ounces, no anxiety, some stress, chronic low back pain, adequate sleep hygiene averages 7 hours a day, migraine started in childhood but more prominent in college, bifrontal, can be nonthrobbing but also throbbing, and dull, she denied associated nausea, vomiting, photophobia, phonophobia, osmophobia, autonomic symptoms, visual disturbance or unilateral numbness or weakness, more intense headaches occur until she falls asleep, every 2 weeks, Advil or Aleve every few weeks, sleep deprivation and back pain and emotional stress exacerbated, laying down to rest and cold compresses on forehead relieve it.  Reviewed images and agree with the following:  Cbc,cmp 01/2019: normal  MRI 12/15/2019: Brain: There is no acute infarction or intracranial hemorrhage. There is no intracranial mass, mass effect, or edema. There is no hydrocephalus or extra-axial fluid collection. Ventricles and sulci are within normal limits in size and configuration. Minimal small foci of T2 hyperintensity within the supratentorial white matter likely reflect nonspecific gliosis/demyelination of doubtful clinical significance. No abnormal enhancement.  Vascular: Major vessel flow voids at the skull base are preserved.  Skull and upper cervical spine: Normal marrow signal is preserved.  Sinuses/Orbits: Right frontoethmoid mucosal thickening with corresponding T1 hyperintensity likely reflecting inspissation. Orbits are unremarkable.  Other: There is a 5 mm T2 hyperintense lesion of the left aspect of the pituitary with enhancement. Mastoid air cells are clear.  IMPRESSION: Subcentimeter pituitary lesion likely reflecting a microadenoma.  Right frontoethmoid inflammatory changes. Nonspecific and of unknown relevance to reported headaches.  Review of  Systems: Pamela Lin complains of symptoms per HPI as well as the following symptoms: headaches. Pertinent negatives and positives per HPI. All others negative.   Social History   Socioeconomic History  . Marital status: Married    Spouse name: Pamela Lin  . Number of children: 0  . Years of education: Not on file  . Highest education level: Master's degree (e.g., MA, MS, MEng, MEd, MSW, MBA)  Occupational History    Employer: [employer]  Tobacco Use  . Smoking status: Never Smoker  . Smokeless tobacco: Never Used  . Tobacco comment: tried a few cigarettes in college   Vaping Use  . Vaping Use: Never used  Substance and Sexual Activity  . Alcohol use: Yes    Alcohol/week: 1.0 standard drink    Types: 1 Standard drinks or equivalent per week    Comment: socially  . Drug use: Never  . Sexual activity: Not Currently    Birth control/protection: None  Other Topics Concern  . Not on file  Social History Narrative   Pamela Lin is right-handed. She lives with her husband in a one level home. Stopped drinking caffeine August 2020. For exercise she walks 4-5 times per week.    Social Determinants of Health   Financial Resource Strain:   . Difficulty of Paying Living Expenses:   Food Insecurity:   . Worried About Charity fundraiser in the Last Year:   . Arboriculturist in the Last Year:   Transportation Needs:   . Film/video editor (Medical):   Marland Kitchen Lack of Transportation (Non-Medical):   Physical Activity:   . Days of Exercise per Week:   . Minutes of Exercise per Session:   Stress:   . Feeling of Stress :   Social Connections:   . Frequency of Communication with Friends and Family:   . Frequency of Social Gatherings with Friends and Family:   . Attends Religious Services:   . Active Member of Clubs or Organizations:   . Attends Archivist Meetings:   Marland Kitchen Marital Status:   Intimate Partner Violence:   . Fear of Current or Ex-Partner:   . Emotionally Abused:     Marland Kitchen Physically Abused:   . Sexually Abused:     Family History  Problem Relation Age of Onset  . Cancer Mother        breast  . Breast cancer Mother   . Headache Mother        had them for a long time, less around 29-53 years old   . Prostate cancer Father   . CAD Father        bypass surgery  . Headache Father   . Cancer Maternal Aunt        breast  . Breast cancer Maternal Aunt   . Cancer Paternal Aunt        breast  . Breast cancer Paternal Aunt   . Migraines Brother     Past Medical History:  Diagnosis Date  . Allergy     Pamela Lin Active Problem List   Diagnosis Date Noted  . Chronic migraine without aura, with intractable migraine, so stated, with status migrainosus 12/24/2019  . Pituitary microadenoma ([place]) 12/24/2019  . HA (headache) 01/29/2019  . Elevated LDL cholesterol level 01/29/2019  . Maxillary sinusitis 06/27/2018  . Screening for cervical cancer 02/05/2018  . Acute frontal sinusitis 02/27/2017  . Healthcare maintenance 10/12/2016  . Obesity (BMI 30-39.9) 10/12/2016  . History  of environmental allergies 10/12/2016    Past Surgical History:  Procedure Laterality Date  . ADENOIDECTOMY    . CHOLECYSTECTOMY  2009    Current Outpatient Medications  Medication Sig Dispense Refill  . azelastine (ASTELIN) 0.1 % nasal spray Place 2 sprays into both nostrils as needed for rhinitis. Use in each nostril as directed 30 mL 3  . fluticasone (FLONASE) 50 MCG/ACT nasal spray Place 2 sprays into both nostrils as needed for allergies or rhinitis. 16 g 3  . Multiple Vitamin (MULTIVITAMIN) tablet Take 1 tablet by mouth daily.    . Probiotic Product (PROBIOTIC PO) Take by mouth daily.    . Fremanezumab-vfrm (AJOVY) 225 MG/1.5ML SOAJ Inject 225 mg into the skin every 30 (thirty) days. 3 pen 4  . rizatriptan (MAXALT-MLT) 10 MG disintegrating tablet Take 1 tablet (10 mg total) by mouth as needed for migraine. May repeat in 2 hours if needed 9 tablet 11   No current  facility-administered medications for this visit.    Allergies as of 12/24/2019 - Review Complete 12/24/2019  Allergen Reaction Noted  . Dust mite extract  10/12/2016  . Grass extracts [gramineae pollens]  01/29/2019  . Other  02/08/2007    Vitals: BP 122/75 (BP Location: Right Arm, Pamela Lin Position: Sitting)   Pulse 63   Ht 5\' 6"  (1.676 m)   Wt 205 lb (93 kg)   BMI 33.09 kg/m  Last Weight:  Wt Readings from Last 1 Encounters:  12/24/19 205 lb (93 kg)   Last Height:   Ht Readings from Last 1 Encounters:  12/24/19 5\' 6"  (1.676 m)     Physical exam: Exam: Gen: NAD, conversant, well nourised, obese, well groomed                     CV: RRR, no MRG. No Carotid Bruits. No peripheral edema, warm, nontender Eyes: Conjunctivae clear without exudates or hemorrhage  Neuro: Detailed Neurologic Exam  Speech:    Speech is normal; fluent and spontaneous with normal comprehension.  Cognition:    The Pamela Lin is oriented to person, place, and time;     recent and remote memory intact;     language fluent;     normal attention, concentration,     fund of knowledge Cranial Nerves:    The pupils are equal, round, and reactive to light. The fundi are flat. Visual fields are full to finger confrontation. Extraocular movements are intact. Trigeminal sensation is intact and the muscles of mastication are normal. The face is symmetric. The palate elevates in the midline. Hearing intact. Voice is normal. Shoulder shrug is normal. The tongue has normal motion without fasciculations.   Coordination:  no dysmetria or ataxia  Gait:  normal native gait  Motor Observation:    No asymmetry, no atrophy, and no involuntary movements noted. Tone:    Normal muscle tone.    Posture:    Posture is normal. normal erect    Strength:    Strength is V/V in the upper and lower limbs.      Sensation: intact to LT     Reflex Exam:  DTR's:    Deep tendon reflexes in the upper and lower  extremities are symmetrical bilaterally.   Toes:    The toes are equivocal bilaterally.   Clonus:    Clonus is absent.    Assessment/Plan: A lovely 49 year old Pamela Lin with chronic intractable migraines for years, daily headaches, at least 10 migraine days a month, she is  tried and failed multiple medications (per HPI).  At this time we will start her on Ajovy and if needed Botox.  We had a long discussion about migraines, migraine management, preventative and acute treatments, nonpharmacological treatments, she has no symptoms of sleep apnea, recent MRI was essentially normal except for a pituitary microadenoma which is benign and unlikely causing any of her headache symptoms.  However we will continue to follow that and check a prolactin, thyroid and several other labs.  Pituitary microadenoma: check prolactin, tsh. Repeat 6 months - 1 year recheck with pituitary protocol, or sooner if needed Start Ajovy for prevention Acutely: Rizatriptan(Maxalt): Please take one tablet at the onset of your headache. If it does not improve the symptoms please take one additional tablet. Do not take more then 2 tablets in 24hrs. Do not take use more then 2 to 3 times in a week. Bloodwork F/u in 4 months   Orders Placed This Encounter  Procedures  . Prolactin  . Lipid panel  . Hemoglobin A1c  . TSH  . Comprehensive metabolic panel  . CBC  . B12 and Folate Panel  . Methylmalonic acid, serum   Meds ordered this encounter  Medications  . rizatriptan (MAXALT-MLT) 10 MG disintegrating tablet    Sig: Take 1 tablet (10 mg total) by mouth as needed for migraine. May repeat in 2 hours if needed    Dispense:  9 tablet    Refill:  11  . Fremanezumab-vfrm (AJOVY) 225 MG/1.5ML SOAJ    Sig: Inject 225 mg into the skin every 30 (thirty) days.    Dispense:  3 pen    Refill:  4    Pamela Lin has copay card; she can have medication for $5 regardless of insurance approval or copay amount.    Cc: Pieter Partridge,  DO  Sarina Ill, MD  [facility] [address] [place], [place] [address]  Phone [phone] Fax [phone]

## 2019-12-24 ENCOUNTER — Ambulatory Visit: Payer: BC Managed Care – PPO | Admitting: Neurology

## 2019-12-24 ENCOUNTER — Other Ambulatory Visit: Payer: Self-pay

## 2019-12-24 ENCOUNTER — Encounter: Payer: Self-pay | Admitting: Neurology

## 2019-12-24 VITALS — BP 122/75 | HR 63 | Ht 66.0 in | Wt 205.0 lb

## 2019-12-24 DIAGNOSIS — G43711 Chronic migraine without aura, intractable, with status migrainosus: Secondary | ICD-10-CM | POA: Diagnosis not present

## 2019-12-24 DIAGNOSIS — D352 Benign neoplasm of pituitary gland: Secondary | ICD-10-CM | POA: Diagnosis not present

## 2019-12-24 MED ORDER — RIZATRIPTAN BENZOATE 10 MG PO TBDP
10.0000 mg | ORAL_TABLET | ORAL | 11 refills | Status: DC | PRN
Start: 2019-12-24 — End: 2020-04-29

## 2019-12-24 MED ORDER — AJOVY 225 MG/1.5ML ~~LOC~~ SOAJ: 225.0000 mg | SUBCUTANEOUS | 4 refills | Status: DC

## 2019-12-24 NOTE — Patient Instructions (Signed)
Start Ajovy for prevention Acutely: Rizatriptan(Maxalt): Please take one tablet at the onset of your headache. If it does not improve the symptoms please take one additional tablet. Do not take more then 2 tablets in 24hrs. Do not take use more then 2 to 3 times in a week. Bloodwork Repeat MRI in 6-12 months or sooner if needed F/u in 4 months  Rizatriptan tablets What is this medicine? RIZATRIPTAN (rye za TRIP tan) is used to treat migraines with or without aura. An aura is a strange feeling or visual disturbance that warns you of an attack. It is not used to prevent migraines. This medicine may be used for other purposes; ask your health care provider or pharmacist if you have questions. COMMON BRAND NAME(S): Maxalt What should I tell my health care provider before I take this medicine? They need to know if you have any of these conditions:  cigarette smoker  circulation problems in fingers and toes  diabetes  heart disease  high blood pressure  high cholesterol  history of irregular heartbeat  history of stroke  kidney disease  liver disease  stomach or intestine problems  an unusual or allergic reaction to rizatriptan, other medicines, foods, dyes, or preservatives  pregnant or trying to get pregnant  breast-feeding How should I use this medicine? Take this medicine by mouth with a glass of water. Follow the directions on the prescription label. Do not take it more often than directed. Talk to your pediatrician regarding the use of this medicine in children. While this drug may be prescribed for children as young as 6 years for selected conditions, precautions do apply. Overdosage: If you think you have taken too much of this medicine contact a poison control center or emergency room at once. NOTE: This medicine is only for you. Do not share this medicine with others. What if I miss a dose? This does not apply. This medicine is not for regular use. What may  interact with this medicine? Do not take this medicine with any of the following medicines:  certain medicines for migraine headache like almotriptan, eletriptan, frovatriptan, naratriptan, rizatriptan, sumatriptan, zolmitriptan  ergot alkaloids like dihydroergotamine, ergonovine, ergotamine, methylergonovine  MAOIs like Carbex, Eldepryl, Marplan, Nardil, and Parnate This medicine may also interact with the following medications:  certain medicines for depression, anxiety, or psychotic disorders  propranolol This list may not describe all possible interactions. Give your health care provider a list of all the medicines, herbs, non-prescription drugs, or dietary supplements you use. Also tell them if you smoke, drink alcohol, or use illegal drugs. Some items may interact with your medicine. What should I watch for while using this medicine? Visit your healthcare professional for regular checks on your progress. Tell your healthcare professional if your symptoms do not start to get better or if they get worse. You may get drowsy or dizzy. Do not drive, use machinery, or do anything that needs mental alertness until you know how this medicine affects you. Do not stand up or sit up quickly, especially if you are an older patient. This reduces the risk of dizzy or fainting spells. Alcohol may interfere with the effect of this medicine. Your mouth may get dry. Chewing sugarless gum or sucking hard candy and drinking plenty of water may help. Contact your healthcare professional if the problem does not go away or is severe. If you take migraine medicines for 10 or more days a month, your migraines may get worse. Keep a diary of headache  days and medicine use. Contact your healthcare professional if your migraine attacks occur more frequently. What side effects may I notice from receiving this medicine? Side effects that you should report to your doctor or health care professional as soon as  possible:  allergic reactions like skin rash, itching or hives, swelling of the face, lips, or tongue  chest pain or chest tightness  signs and symptoms of a dangerous change in heartbeat or heart rhythm like chest pain; dizziness; fast, irregular heartbeat; palpitations; feeling faint or lightheaded; falls; breathing problems  signs and symptoms of a stroke like changes in vision; confusion; trouble speaking or understanding; severe headaches; sudden numbness or weakness of the face, arm or leg; trouble walking; dizziness; loss of balance or coordination  signs and symptoms of serotonin syndrome like irritable; confusion; diarrhea; fast or irregular heartbeat; muscle twitching; stiff muscles; trouble walking; sweating; high fever; seizures; chills; vomiting Side effects that usually do not require medical attention (report to your doctor or health care professional if they continue or are bothersome):  diarrhea  dizziness  drowsiness  dry mouth  headache  nausea, vomiting  pain, tingling, numbness in the hands or feet  stomach pain This list may not describe all possible side effects. Call your doctor for medical advice about side effects. You may report side effects to FDA at 1-800-FDA-1088. Where should I keep my medicine? Keep out of the reach of children. Store at room temperature between 15 and 30 degrees C (59 and 86 degrees F). Keep container tightly closed. Throw away any unused medicine after the expiration date. NOTE: This sheet is a summary. It may not cover all possible information. If you have questions about this medicine, talk to your doctor, pharmacist, or health care provider.  2020 Elsevier/Gold Standard (2018-01-08 14:59:59) Rolanda Lundborg injection What is this medicine? FREMANEZUMAB (fre ma NEZ ue mab) is used to prevent migraine headaches. This medicine may be used for other purposes; ask your health care provider or pharmacist if you have questions. COMMON  BRAND NAME(S): AJOVY What should I tell my health care provider before I take this medicine? They need to know if you have any of these conditions:  an unusual or allergic reaction to fremanezumab, other medicines, foods, dyes, or preservatives  pregnant or trying to get pregnant  breast-feeding How should I use this medicine? This medicine is for injection under the skin. You will be taught how to prepare and give this medicine. Use exactly as directed. Take your medicine at regular intervals. Do not take your medicine more often than directed. It is important that you put your used needles and syringes in a special sharps container. Do not put them in a trash can. If you do not have a sharps container, call your pharmacist or healthcare provider to get one. Talk to your pediatrician regarding the use of this medicine in children. Special care may be needed. Overdosage: If you think you have taken too much of this medicine contact a poison control center or emergency room at once. NOTE: This medicine is only for you. Do not share this medicine with others. What if I miss a dose? If you miss a dose, take it as soon as you can. If it is almost time for your next dose, take only that dose. Do not take double or extra doses. What may interact with this medicine? Interactions are not expected. This list may not describe all possible interactions. Give your health care provider a list of all  the medicines, herbs, non-prescription drugs, or dietary supplements you use. Also tell them if you smoke, drink alcohol, or use illegal drugs. Some items may interact with your medicine. What should I watch for while using this medicine? Tell your doctor or healthcare professional if your symptoms do not start to get better or if they get worse. What side effects may I notice from receiving this medicine? Side effects that you should report to your doctor or health care professional as soon as  possible:  allergic reactions like skin rash, itching or hives, swelling of the face, lips, or tongue Side effects that usually do not require medical attention (report these to your doctor or health care professional if they continue or are bothersome):  pain, redness, or irritation at site where injected This list may not describe all possible side effects. Call your doctor for medical advice about side effects. You may report side effects to FDA at 1-800-FDA-1088. Where should I keep my medicine? Keep out of the reach of children. You will be instructed on how to store this medicine. Throw away any unused medicine after the expiration date on the label. NOTE: This sheet is a summary. It may not cover all possible information. If you have questions about this medicine, talk to your doctor, pharmacist, or health care provider.  2020 Elsevier/Gold Standard (2017-03-26 17:22:56)

## 2019-12-28 LAB — COMPREHENSIVE METABOLIC PANEL
ALT: 18 IU/L (ref 0–32)
AST: 18 IU/L (ref 0–40)
Albumin/Globulin Ratio: 1.7 (ref 1.2–2.2)
Albumin: 4.5 g/dL (ref 3.8–4.9)
Alkaline Phosphatase: 72 IU/L (ref 48–121)
BUN/Creatinine Ratio: 14 (ref 9–23)
BUN: 9 mg/dL (ref 6–24)
Bilirubin Total: 0.6 mg/dL (ref 0.0–1.2)
CO2: 27 mmol/L (ref 20–29)
Calcium: 9.8 mg/dL (ref 8.7–10.2)
Chloride: 105 mmol/L (ref 96–106)
Creatinine, Ser: 0.63 mg/dL (ref 0.57–1.00)
GFR calc Af Amer: 120 mL/min/{1.73_m2} (ref >59)
GFR calc non Af Amer: 104 mL/min/{1.73_m2} (ref >59)
Globulin, Total: 2.7 g/dL (ref 1.5–4.5)
Glucose: 93 mg/dL (ref 65–99)
Potassium: 4.7 mmol/L (ref 3.5–5.2)
Sodium: 143 mmol/L (ref 134–144)
Total Protein: 7.2 g/dL (ref 6.0–8.5)

## 2019-12-28 LAB — CBC
Hematocrit: 41.7 % (ref 34.0–46.6)
Hemoglobin: 14.3 g/dL (ref 11.1–15.9)
MCH: 30.4 pg (ref 26.6–33.0)
MCHC: 34.3 g/dL (ref 31.5–35.7)
MCV: 89 fL (ref 79–97)
Platelets: 176 10*3/uL (ref 150–450)
RBC: 4.71 x10E6/uL (ref 3.77–5.28)
RDW: 13 % (ref 11.7–15.4)
WBC: 4.5 10*3/uL (ref 3.4–10.8)

## 2019-12-28 LAB — PROLACTIN: Prolactin: 10.7 ng/mL (ref 4.8–23.3)

## 2019-12-28 LAB — LIPID PANEL
Chol/HDL Ratio: 3.8 ratio (ref 0.0–4.4)
Cholesterol, Total: 229 mg/dL — ABNORMAL HIGH (ref 100–199)
HDL: 61 mg/dL (ref >39)
LDL Chol Calc (NIH): 146 mg/dL — ABNORMAL HIGH (ref 0–99)
Triglycerides: 125 mg/dL (ref 0–149)
VLDL Cholesterol Cal: 22 mg/dL (ref 5–40)

## 2019-12-28 LAB — METHYLMALONIC ACID, SERUM: Methylmalonic Acid: 248 nmol/L (ref 0–378)

## 2019-12-28 LAB — HEMOGLOBIN A1C
Est. average glucose Bld gHb Est-mCnc: 108 mg/dL
Hgb A1c MFr Bld: 5.4 % (ref 4.8–5.6)

## 2019-12-28 LAB — TSH: TSH: 1.24 u[IU]/mL (ref 0.450–4.500)

## 2019-12-28 LAB — B12 AND FOLATE PANEL
Folate: >20 ng/mL (ref >3.0)
Vitamin B-12: 354 pg/mL (ref 232–1245)

## 2020-01-08 ENCOUNTER — Ambulatory Visit (INDEPENDENT_AMBULATORY_CARE_PROVIDER_SITE_OTHER): Payer: BC Managed Care – PPO | Admitting: Physician Assistant

## 2020-01-08 ENCOUNTER — Encounter: Payer: Self-pay | Admitting: Physician Assistant

## 2020-01-08 ENCOUNTER — Other Ambulatory Visit: Payer: Self-pay

## 2020-01-08 VITALS — BP 107/72 | HR 71 | Temp 98.4°F | Ht 66.0 in | Wt 205.2 lb

## 2020-01-08 DIAGNOSIS — Z8249 Family history of ischemic heart disease and other diseases of the circulatory system: Secondary | ICD-10-CM

## 2020-01-08 DIAGNOSIS — Z1211 Encounter for screening for malignant neoplasm of colon: Secondary | ICD-10-CM

## 2020-01-08 DIAGNOSIS — E7800 Pure hypercholesterolemia, unspecified: Secondary | ICD-10-CM | POA: Diagnosis not present

## 2020-01-08 DIAGNOSIS — Z0000 Encounter for general adult medical examination without abnormal findings: Secondary | ICD-10-CM

## 2020-01-08 DIAGNOSIS — Z23 Encounter for immunization: Secondary | ICD-10-CM | POA: Diagnosis not present

## 2020-01-08 MED ORDER — ATORVASTATIN CALCIUM 10 MG PO TABS: ORAL_TABLET | ORAL | 0 refills | Status: DC

## 2020-01-08 MED ORDER — ATORVASTATIN CALCIUM 10 MG PO TABS: 10.0000 mg | ORAL_TABLET | Freq: Every day | ORAL | 0 refills | Status: DC

## 2020-01-08 NOTE — Progress Notes (Signed)
Female Physical   Impression and Recommendations:    1. Healthcare maintenance   2. Need for shingles vaccine   3. Screening for colon cancer   4. Elevated LDL cholesterol level   5. Family history of heart disease      1) Anticipatory Guidance: Discussed skin CA prevention and sunscreen when outside along with skin surveillance; eating a balanced and modest diet; physical activity at least 25 minutes per day or minimum of 150 min/ week moderate to intense activity.  2) Immunizations / Screenings / Labs:   All immunizations are up-to-date per recommendations or will be updated today if pt allows.    - Patient understands with dental and vision screens they will schedule independently.  - Recently had CBC, CMP, HgA1c, Lipid panel, and TSH checked by her neurologist. -UTD on mammogram, Pap smear, Tdap -Placed orders for Cologuard and Shingrix vaccine. -Patient declined hep C and HIV screening  3) Weight:  BMI meaning discussed with patient.  Discussed goal to improve diet habits to improve overall feelings of well being and objective health data. Improve nutrient density of diet through increasing intake of fruits and vegetables and decreasing saturated fats, white flour products and refined sugars. -Patient follows a vegetarian diet.  4) Healthcare maintenance: -Continue current medication regimen. -Discussed lab results.  Most were within normal limits with exception of lipid panel, total cholesterol and LDL slightly increased from prior. The 10-year ASCVD risk score Mikey Bussing DC Brooke Bonito., et al., 2013) is: 1%   Values used to calculate the score:     Age: 49 years     Sex: Female     Is Non-Hispanic African American: No     Diabetic: No     Tobacco smoker: No     Systolic Blood Pressure: 914 mmHg     Is BP treated: No     HDL Cholesterol: 61 mg/dL     Total Cholesterol: 229 mg/dL -Due to family history of heart disease started low-dose statin once weekly, and recommend to continue  heart healthy diet and moderate intensity physical activity. -Follow-up in 3 months for HLD and medication management, FBW to recheck lipid panel and CMP.   Meds ordered this encounter  Medications   DISCONTD: atorvastatin (LIPITOR) 10 MG tablet    Sig: Take 1 tablet (10 mg total) by mouth daily.    Dispense:  90 tablet    Refill:  0    Order Specific Question:   Supervising Provider    Answer:   Beatrice Lecher D [[id number]]   atorvastatin (LIPITOR) 10 MG tablet    Sig: Take 1 tablet by mouth at bedtime once weekly.    Dispense:  90 tablet    Refill:  0    Order Specific Question:   Supervising Provider    Answer:   Beatrice Lecher D [[id number]]    Orders Placed This Encounter  Procedures   Varicella-zoster vaccine IM (Shingrix)   Cologuard     Return in about 3 months (around 04/09/2020) for HLD and recheck lipid panel, cmp.     Gross side effects, risk and benefits, and alternatives of medications discussed with patient.  Patient is aware that all medications have potential side effects and we are unable to predict every side effect or drug-drug interaction that may occur.  Expresses verbal understanding and consents to current therapy plan and treatment regimen.  F-up preventative CPE in 1 year- reminded pt again, this is in addition to any chronic care visits.  Please see orders placed and AVS handed out to patient at the end of our visit for further patient instructions/ counseling done pertaining to today's office visit.  Note:  This note was prepared with assistance of Dragon voice recognition software. Occasional wrong-word or sound-a-like substitutions may have occurred due to the inherent limitations of voice recognition software.   Subjective:     CPE HPI: Pamela Lin is a 49 y.o. female who presents to [place] at [facility] today for a yearly health maintenance exam.   Health Maintenance Summary  - Reviewed and updated, unless  pt declines services.  Last Cologuard or Colonoscopy:  Placed order for cologuard. Family history of Colon CA: No  Tobacco History Reviewed:  Y, never smoker Alcohol and/or drug use: No concerns; no use Exercise Habits:  Treadmill 3 times/wk Dental Home: Y, hasn't been recently Eye exams: Y Dermatology home: Y Female Health:  PAP Smear - last known results: 02/05/18- negative STD concerns:  none Lumps or breast concerns:  none Breast Cancer Family History: Yes  Additional concerns beyond health maintenance issues: none   Immunization History  Administered Date(s) Administered   Influenza,inj,Quad PF,6+ Mos 03/21/2019   Influenza-Unspecified 04/20/2017, 04/13/2018, 03/19/2019   Tdap 10/09/2007, 02/05/2018   Zoster Recombinat (Shingrix) 01/08/2020     Health Maintenance  Topic Date Due   Hepatitis C Screening  Never done   COVID-19 Vaccine (1) Never done   COLONOSCOPY  Never done   INFLUENZA VACCINE  02/08/2020   PAP SMEAR-Modifier  02/05/2021   MAMMOGRAM  02/19/2021   TETANUS/TDAP  02/06/2028   HIV Screening  Completed     Wt Readings from Last 3 Encounters:  01/08/20 205 lb 3.2 oz (93.1 kg)  12/24/19 205 lb (93 kg)  09/22/19 202 lb (91.6 kg)   BP Readings from Last 3 Encounters:  01/08/20 107/72  12/24/19 122/75  09/22/19 122/79   Pulse Readings from Last 3 Encounters:  01/08/20 71  12/24/19 63  09/22/19 65     Past Medical History:  Diagnosis Date   Allergy       Past Surgical History:  Procedure Laterality Date   ADENOIDECTOMY     CHOLECYSTECTOMY  2009      Family History  Problem Relation Age of Onset   Cancer Mother        breast   Breast cancer Mother    Headache Mother        had them for a long time, less around 74-48 years old    Prostate cancer Father    CAD Father        bypass surgery   Headache Father    Cancer Maternal Aunt        breast   Breast cancer Maternal Aunt    Cancer Paternal Aunt         breast   Breast cancer Paternal Aunt    Migraines Brother       Social History   Substance and Sexual Activity  Drug Use Never  ,   Social History   Substance and Sexual Activity  Alcohol Use Yes   Alcohol/week: 1.0 standard drink   Types: 1 Standard drinks or equivalent per week   Comment: socially  ,   Social History   Tobacco Use  Smoking Status Never Smoker  Smokeless Tobacco Never Used  Tobacco Comment   tried a few cigarettes in college   ,   Social History   Substance and  Sexual Activity  Sexual Activity Not Currently   Birth control/protection: None    Current Outpatient Medications on File Prior to Visit  Medication Sig Dispense Refill   azelastine (ASTELIN) 0.1 % nasal spray Place 2 sprays into both nostrils as needed for rhinitis. Use in each nostril as directed 30 mL 3   fluticasone (FLONASE) 50 MCG/ACT nasal spray Place 2 sprays into both nostrils as needed for allergies or rhinitis. 16 g 3   Fremanezumab-vfrm (AJOVY) 225 MG/1.5ML SOAJ Inject 225 mg into the skin every 30 (thirty) days. 3 pen 4   Multiple Vitamin (MULTIVITAMIN) tablet Take 1 tablet by mouth daily.     rizatriptan (MAXALT-MLT) 10 MG disintegrating tablet Take 1 tablet (10 mg total) by mouth as needed for migraine. May repeat in 2 hours if needed 9 tablet 11   No current facility-administered medications on file prior to visit.    Allergies: Dust mite extract, Grass extracts [gramineae pollens], and Other  Review of Systems: General:   Denies fever, chills, unexplained weight loss.  Optho/Auditory:   Denies visual changes, blurred vision/LOV Respiratory:   Denies SOB, DOE more than baseline levels.   Cardiovascular:   Denies chest pain, palpitations, new onset peripheral edema  Gastrointestinal:   Denies nausea, vomiting, diarrhea.  Genitourinary: Denies dysuria, freq/ urgency, flank pain, vaginal discharge Endocrine:     Denies hot or cold intolerance, polyuria,  polydipsia. Musculoskeletal:   Denies unexplained myalgias, joint swelling, unexplained arthralgias, gait problems.  Skin:  Denies rash, suspicious lesions Neurological:     Denies dizziness, unexplained weakness, numbness  Psychiatric/Behavioral:   Denies mood changes, suicidal or homicidal ideations, hallucinations    Objective:    Blood pressure 107/72, pulse 71, temperature 98.4 F (36.9 C), temperature source Oral, height 5\' 6"  (1.676 m), weight 205 lb 3.2 oz (93.1 kg), SpO2 99 %. Body mass index is 33.12 kg/m. General Appearance:    Alert, cooperative, no distress, appears stated age  Head:    Normocephalic, without obvious abnormality, atraumatic  Eyes:    PERRL, conjunctiva/corneas clear, EOM's intact, fundi    benign, both eyes  Ears:    Normal TM's and external ear canals, both ears  Nose:   Nares normal, septum midline, mucosa normal, no drainage    or sinus tenderness  Throat:   Lips w/o lesion, mucosa moist, and tongue normal; teeth and   gums normal  Neck:   Supple, symmetrical, trachea midline, no adenopathy;    thyroid:  no enlargement/tenderness/nodules; no carotid   bruit or JVD  Back:     Symmetric, no curvature, ROM normal, no CVA tenderness  Lungs:     Clear to auscultation bilaterally, respirations unlabored, no       Wh/ R/ R  Chest Wall:    No tenderness or gross deformity; normal excursion   Heart:    Regular rate and rhythm, S1 and S2 normal, no murmur, rub   or gallop  Breast Exam:    Deferred.   Abdomen:     Soft, non-tender, bowel sounds active all four quadrants, NO   G/R/R, no masses, no organomegaly  Genitalia:    Deferred.  Rectal:    Deferred.  Extremities:   Extremities normal, atraumatic, no cyanosis or gross edema  Pulses:   2+ and symmetric all extremities  Skin:   Warm, dry, Skin color, texture, turgor normal, no obvious rashes or lesions Psych: No HI/SI, judgement and insight good, Euthymic mood. Full Affect.  Neurologic:  CNII-XII  intact, normal strength, sensation and reflexes    Throughout

## 2020-01-08 NOTE — Patient Instructions (Signed)
Guidelines for a Low Cholesterol, Low Saturated Fat Diet   Fats - Limit total intake of fats and oils. - Avoid butter, stick margarine, shortening, lard, palm and coconut oils. - Limit mayonnaise, salad dressings, gravies and sauces, unless they are homemade with low-fat ingredients. - Limit chocolate. - Choose low-fat and nonfat products, such as low-fat mayonnaise, low-fat or non-hydrogenated peanut butter, low-fat or fat-free salad dressings and nonfat gravy. - Use vegetable oil, such as canola or olive oil. - Look for margarine that does not contain trans fatty acids. - Use nuts in moderate amounts. - Read ingredient labels carefully to determine both amount and type of fat present in foods. Limit saturated and trans fats! - Avoid high-fat processed and convenience foods.  Meats and Meat Alternatives - Choose fish, chicken, [place] and lean meats. - Use dried beans, peas, lentils and tofu. - Limit egg yolks to three to four per week. - If you eat red meat, limit to no more than three servings per week and choose loin or round cuts. - Avoid fatty meats, such as bacon, sausage, franks, luncheon meats and ribs. - Avoid all organ meats, including liver.  Dairy - Choose nonfat or low-fat milk, yogurt and cottage cheese. - Most cheeses are high in fat. Choose cheeses made from non-fat milk, such as mozzarella and ricotta cheese. - Choose light or fat-free cream cheese and sour cream. - Avoid cream and sauces made with cream.  Fruits and Vegetables - Eat a wide variety of fruits and vegetables. - Use lemon juice, vinegar or "mist" olive oil on vegetables. - Avoid adding sauces, fat or oil to vegetables.  Breads, Cereals and Grains - Choose whole-grain breads, cereals, pastas and rice. - Avoid high-fat snack foods, such as granola, cookies, pies, pastries, doughnuts and croissants.  Cooking Tips - Avoid deep fried foods. - Trim visible fat off meats and remove skin from poultry  before cooking. - Bake, broil, boil, poach or roast poultry, fish and lean meats. - Drain and discard fat that drains out of meat as you cook it. - Add little or no fat to foods. - Use vegetable oil sprays to grease pans for cooking or baking. - Steam vegetables. - Use herbs or no-oil marinades to flavor foods.    Preventive Care 7-51 Years Old, Female Preventive care refers to visits with your health care provider and lifestyle choices that can promote health and wellness. This includes:  A yearly physical exam. This may also be called an annual well check.  Regular dental visits and eye exams.  Immunizations.  Screening for certain conditions.  Healthy lifestyle choices, such as eating a healthy diet, getting regular exercise, not using drugs or products that contain nicotine and tobacco, and limiting alcohol use. What can I expect for my preventive care visit? Physical exam Your health care provider will check your:  Height and weight. This may be used to calculate body mass index (BMI), which tells if you are at a healthy weight.  Heart rate and blood pressure.  Skin for abnormal spots. Counseling Your health care provider may ask you questions about your:  Alcohol, tobacco, and drug use.  Emotional well-being.  Home and relationship well-being.  Sexual activity.  Eating habits.  Work and work Statistician.  Method of birth control.  Menstrual cycle.  Pregnancy history. What immunizations do I need?  Influenza (flu) vaccine  This is recommended every year. Tetanus, diphtheria, and pertussis (Tdap) vaccine  You may need a Td booster  every 10 years. Varicella (chickenpox) vaccine  You may need this if you have not been vaccinated. Zoster (shingles) vaccine  You may need this after age 45. Measles, mumps, and rubella (MMR) vaccine  You may need at least one dose of MMR if you were born in 1957 or later. You may also need a second dose. Pneumococcal  conjugate (PCV13) vaccine  You may need this if you have certain conditions and were not previously vaccinated. Pneumococcal polysaccharide (PPSV23) vaccine  You may need one or two doses if you smoke cigarettes or if you have certain conditions. Meningococcal conjugate (MenACWY) vaccine  You may need this if you have certain conditions. Hepatitis A vaccine  You may need this if you have certain conditions or if you travel or work in places where you may be exposed to hepatitis A. Hepatitis B vaccine  You may need this if you have certain conditions or if you travel or work in places where you may be exposed to hepatitis B. Haemophilus influenzae type b (Hib) vaccine  You may need this if you have certain conditions. Human papillomavirus (HPV) vaccine  If recommended by your health care provider, you may need three doses over 6 months. You may receive vaccines as individual doses or as more than one vaccine together in one shot (combination vaccines). Talk with your health care provider about the risks and benefits of combination vaccines. What tests do I need? Blood tests  Lipid and cholesterol levels. These may be checked every 5 years, or more frequently if you are over 51 years old.  Hepatitis C test.  Hepatitis B test. Screening  Lung cancer screening. You may have this screening every year starting at age 29 if you have a 30-pack-year history of smoking and currently smoke or have quit within the past 15 years.  Colorectal cancer screening. All adults should have this screening starting at age 76 and continuing until age 27. Your health care provider may recommend screening at age 67 if you are at increased risk. You will have tests every 1-10 years, depending on your results and the type of screening test.  Diabetes screening. This is done by checking your blood sugar (glucose) after you have not eaten for a while (fasting). You may have this done every 1-3  years.  Mammogram. This may be done every 1-2 years. Talk with your health care provider about when you should start having regular mammograms. This may depend on whether you have a family history of breast cancer.  BRCA-related cancer screening. This may be done if you have a family history of breast, ovarian, tubal, or peritoneal cancers.  Pelvic exam and Pap test. This may be done every 3 years starting at age 16. Starting at age 48, this may be done every 5 years if you have a Pap test in combination with an HPV test. Other tests  Sexually transmitted disease (STD) testing.  Bone density scan. This is done to screen for osteoporosis. You may have this scan if you are at high risk for osteoporosis. Follow these instructions at home: Eating and drinking  Eat a diet that includes fresh fruits and vegetables, whole grains, lean protein, and low-fat dairy.  Take vitamin and mineral supplements as recommended by your health care provider.  Do not drink alcohol if: ? Your health care provider tells you not to drink. ? You are pregnant, may be pregnant, or are planning to become pregnant.  If you drink alcohol: ? Limit how  much you have to 0-1 drink a day. ? Be aware of how much alcohol is in your drink. In the [place] one drink equals one 12 oz bottle of beer (355 mL), one 5 oz glass of wine (148 mL), or one 1 oz glass of hard liquor (44 mL). Lifestyle  Take daily care of your teeth and gums.  Stay active. Exercise for at least 30 minutes on 5 or more days each week.  Do not use any products that contain nicotine or tobacco, such as cigarettes, e-cigarettes, and chewing tobacco. If you need help quitting, ask your health care provider.  If you are sexually active, practice safe sex. Use a condom or other form of birth control (contraception) in order to prevent pregnancy and STIs (sexually transmitted infections).  If told by your health care provider, take low-dose aspirin daily  starting at age 51. What's next?  Visit your health care provider once a year for a well check visit.  Ask your health care provider how often you should have your eyes and teeth checked.  Stay up to date on all vaccines. This information is not intended to replace advice given to you by your health care provider. Make sure you discuss any questions you have with your health care provider. Document Revised: 03/07/2018 Document Reviewed: 03/07/2018 Elsevier Patient Education  2020 [employer].

## 2020-01-26 ENCOUNTER — Ambulatory Visit: Payer: BC Managed Care – PPO | Admitting: Neurology

## 2020-02-02 ENCOUNTER — Encounter: Payer: BC Managed Care – PPO | Admitting: Physician Assistant

## 2020-02-11 LAB — COLOGUARD: Cologuard: NEGATIVE

## 2020-02-23 ENCOUNTER — Ambulatory Visit: Payer: BC Managed Care – PPO

## 2020-02-23 ENCOUNTER — Encounter: Payer: Self-pay | Admitting: Physician Assistant

## 2020-03-05 ENCOUNTER — Ambulatory Visit
Admission: RE | Admit: 2020-03-05 | Discharge: 2020-03-05 | Disposition: A | Discharge status: Home / Self Care | Payer: BC Managed Care – PPO | Source: Ambulatory Visit | Attending: Adult Health | Admitting: Adult Health

## 2020-03-05 ENCOUNTER — Other Ambulatory Visit: Payer: Self-pay

## 2020-03-05 DIAGNOSIS — Z1231 Encounter for screening mammogram for malignant neoplasm of breast: Secondary | ICD-10-CM

## 2020-03-11 ENCOUNTER — Encounter: Payer: Self-pay | Admitting: Physician Assistant

## 2020-04-03 ENCOUNTER — Other Ambulatory Visit: Payer: Self-pay | Admitting: Neurology

## 2020-04-03 DIAGNOSIS — R519 Headache, unspecified: Secondary | ICD-10-CM

## 2020-04-03 DIAGNOSIS — D352 Benign neoplasm of pituitary gland: Secondary | ICD-10-CM

## 2020-04-05 ENCOUNTER — Encounter: Payer: Self-pay | Admitting: Physician Assistant

## 2020-04-05 ENCOUNTER — Telehealth: Payer: Self-pay | Admitting: Neurology

## 2020-04-05 DIAGNOSIS — E7800 Pure hypercholesterolemia, unspecified: Secondary | ICD-10-CM

## 2020-04-05 MED ORDER — ATORVASTATIN CALCIUM 10 MG PO TABS: ORAL_TABLET | ORAL | 0 refills | Status: DC

## 2020-04-05 NOTE — Telephone Encounter (Signed)
[employer] Auth: [id number] (exp. 04/05/20 to 10/01/20) order sent to [facility]. They will reach out to the patient to schedule.

## 2020-04-28 ENCOUNTER — Encounter: Payer: Self-pay | Admitting: Physician Assistant

## 2020-04-28 ENCOUNTER — Ambulatory Visit: Payer: BC Managed Care – PPO | Admitting: Physician Assistant

## 2020-04-28 ENCOUNTER — Other Ambulatory Visit: Payer: Self-pay

## 2020-04-28 VITALS — BP 117/78 | HR 61 | Temp 98.4°F | Ht 66.0 in | Wt 207.7 lb

## 2020-04-28 DIAGNOSIS — Z8249 Family history of ischemic heart disease and other diseases of the circulatory system: Secondary | ICD-10-CM | POA: Diagnosis not present

## 2020-04-28 DIAGNOSIS — R442 Other hallucinations: Secondary | ICD-10-CM

## 2020-04-28 DIAGNOSIS — Z23 Encounter for immunization: Secondary | ICD-10-CM

## 2020-04-28 DIAGNOSIS — E7800 Pure hypercholesterolemia, unspecified: Secondary | ICD-10-CM | POA: Diagnosis not present

## 2020-04-28 DIAGNOSIS — Z79899 Other long term (current) drug therapy: Secondary | ICD-10-CM

## 2020-04-28 NOTE — Patient Instructions (Signed)

## 2020-04-28 NOTE — Progress Notes (Signed)
Established Patient Office Visit  Subjective:  Patient ID: Pamela Lin, female    DOB: [birth year]  Age: 49 y.o. MRN: [id number]  CC:  Chief Complaint  Patient presents with  . Hyperlipidemia    HPI Pamela Lin presents for follow up on hyperlipidemia. Patient was started on low dose statin once weekly last OV due to elevated LDL and family history of heart diease. Patient tolerating medication well. Denies side effects. Also reports for the past few weeks has been experiencing weird smells like smoke/tobacco. Patient recovered from Covid-19 infection several weeks ago.  Patient is also obtaining second dose of Shingrix today.  Past Medical History:  Diagnosis Date  . Allergy     Past Surgical History:  Procedure Laterality Date  . ADENOIDECTOMY    . CHOLECYSTECTOMY  2009    Family History  Problem Relation Age of Onset  . Cancer Mother        breast  . Breast cancer Mother   . Headache Mother        had them for a long time, less around 59-98 years old   . Prostate cancer Father   . CAD Father        bypass surgery  . Headache Father   . Cancer Maternal Aunt        breast  . Breast cancer Maternal Aunt   . Cancer Paternal Aunt        breast  . Breast cancer Paternal Aunt   . Migraines Brother     Social History   Socioeconomic History  . Marital status: Married    Spouse name: Pamela Lin  . Number of children: 0  . Years of education: Not on file  . Highest education level: Master's degree (e.g., MA, MS, MEng, MEd, MSW, MBA)  Occupational History    Employer: [employer]  Tobacco Use  . Smoking status: Never Smoker  . Smokeless tobacco: Never Used  . Tobacco comment: tried a few cigarettes in college   Vaping Use  . Vaping Use: Never used  Substance and Sexual Activity  . Alcohol use: Yes    Alcohol/week: 1.0 standard drink    Types: 1 Standard drinks or equivalent per week    Comment: socially  . Drug use: Never  . Sexual  activity: Not Currently    Birth control/protection: None  Other Topics Concern  . Not on file  Social History Narrative   Patient is right-handed. She lives with her husband in a one level home. Stopped drinking caffeine August 2020. For exercise she walks 4-5 times per week.    Social Determinants of Health   Financial Resource Strain:   . Difficulty of Paying Living Expenses: Not on file  Food Insecurity:   . Worried About Charity fundraiser in the Last Year: Not on file  . Ran Out of Food in the Last Year: Not on file  Transportation Needs:   . Lack of Transportation (Medical): Not on file  . Lack of Transportation (Non-Medical): Not on file  Physical Activity:   . Days of Exercise per Week: Not on file  . Minutes of Exercise per Session: Not on file  Stress:   . Feeling of Stress : Not on file  Social Connections:   . Frequency of Communication with Friends and Family: Not on file  . Frequency of Social Gatherings with Friends and Family: Not on file  . Attends Religious Services: Not on file  . Active  Member of Clubs or Organizations: Not on file  . Attends Archivist Meetings: Not on file  . Marital Status: Not on file  Intimate Partner Violence:   . Fear of Current or Ex-Partner: Not on file  . Emotionally Abused: Not on file  . Physically Abused: Not on file  . Sexually Abused: Not on file    Outpatient Medications Prior to Visit  Medication Sig Dispense Refill  . atorvastatin (LIPITOR) 10 MG tablet Take 1 tablet by mouth at bedtime once weekly. 90 tablet 0  . azelastine (ASTELIN) 0.1 % nasal spray Place 2 sprays into both nostrils as needed for rhinitis. Use in each nostril as directed 30 mL 3  . fluticasone (FLONASE) 50 MCG/ACT nasal spray Place 2 sprays into both nostrils as needed for allergies or rhinitis. 16 g 3  . Fremanezumab-vfrm (AJOVY) 225 MG/1.5ML SOAJ Inject 225 mg into the skin every 30 (thirty) days. 3 pen 4  . Multiple Vitamin  (MULTIVITAMIN) tablet Take 2 tablets by mouth daily.     . polycarbophil (FIBERCON) 625 MG tablet Take 625 mg by mouth daily.    . rizatriptan (MAXALT-MLT) 10 MG disintegrating tablet Take 1 tablet (10 mg total) by mouth as needed for migraine. May repeat in 2 hours if needed 9 tablet 11   No facility-administered medications prior to visit.    Allergies  Allergen Reactions  . Dust Mite Extract   . Grass Extracts [Gramineae Pollens]   . Other     Weed pollen, mold    ROS Review of Systems A fourteen system review of systems was performed and found to be positive as per HPI.  Objective:    Physical Exam General:  Well Developed, well nourished, appropriate for stated age.  Neuro:  Alert and oriented,  extra-ocular muscles intact  HEENT:  Normocephalic, atraumatic, neck supple  Skin:  no gross rash, warm, pink. Cardiac:  RRR, S1 S2 Respiratory:  ECTA B/L and A/P, Not using accessory muscles, speaking in full sentences- unlabored. Vascular:  Ext warm, no cyanosis apprec.; no gross edema. Psych:  No HI/SI, judgement and insight good, Euthymic mood. Full Affect.   BP 117/78   Pulse 61   Temp 98.4 F (36.9 C) (Oral)   Ht 5' 6"  (1.676 m)   Wt 207 lb 11.2 oz (94.2 kg)   SpO2 100% Comment: on RA  BMI 33.52 kg/m  Wt Readings from Last 3 Encounters:  04/28/20 207 lb 11.2 oz (94.2 kg)  01/08/20 205 lb 3.2 oz (93.1 kg)  12/24/19 205 lb (93 kg)     Health Maintenance Due  Topic Date Due  . Hepatitis C Screening  Never done  . COLONOSCOPY  Never done    There are no preventive care reminders to display for this patient.  Lab Results  Component Value Date   TSH 1.240 12/24/2019   Lab Results  Component Value Date   WBC 4.5 12/24/2019   HGB 14.3 12/24/2019   HCT 41.7 12/24/2019   MCV 89 12/24/2019   PLT 176 12/24/2019   Lab Results  Component Value Date   NA 143 12/24/2019   K 4.7 12/24/2019   CO2 27 12/24/2019   GLUCOSE 93 12/24/2019   BUN 9 12/24/2019    CREATININE 0.63 12/24/2019   BILITOT 0.6 12/24/2019   ALKPHOS 72 12/24/2019   AST 18 12/24/2019   ALT 18 12/24/2019   PROT 7.2 12/24/2019   ALBUMIN 4.5 12/24/2019   CALCIUM 9.8 12/24/2019  Lab Results  Component Value Date   CHOL 229 (H) 12/24/2019   Lab Results  Component Value Date   HDL 61 12/24/2019   Lab Results  Component Value Date   LDLCALC 146 (H) 12/24/2019   Lab Results  Component Value Date   TRIG 125 12/24/2019   Lab Results  Component Value Date   CHOLHDL 3.8 12/24/2019   Lab Results  Component Value Date   HGBA1C 5.4 12/24/2019      Assessment & Plan:   Problem List Items Addressed This Visit      Other   Elevated LDL cholesterol level - Primary   Relevant Orders   Lipid Profile   Comp Met (CMET)    Other Visit Diagnoses    Family history of heart disease       On statin therapy       Relevant Orders   Comp Met (CMET)   Need for shingles vaccine       Phantosmia         Elevated LDL cholesterol level, family history of heart disease: -Last lipid panel total cholesterol 229, triglycerides 125, HDL 61, LDL 146 -Will recheck lipid panel and hepatic function today. -Continue current medication regimen. -Follow a heart healthy diet. Stay active. -Will continue to monitor.  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  No orders of the defined types were placed in this encounter.   Follow-up: Return for CPE and FBW in July.   Note:  This note was prepared with assistance of Dragon voice recognition software. Occasional wrong-word or sound-a-like substitutions may have occurred due to the inherent limitations of voice recognition software.   Lorrene Reid, PA-C

## 2020-04-29 ENCOUNTER — Telehealth (INDEPENDENT_AMBULATORY_CARE_PROVIDER_SITE_OTHER): Payer: BC Managed Care – PPO | Admitting: Neurology

## 2020-04-29 ENCOUNTER — Encounter: Payer: Self-pay | Admitting: Neurology

## 2020-04-29 ENCOUNTER — Telehealth: Payer: Self-pay | Admitting: Neurology

## 2020-04-29 DIAGNOSIS — G43711 Chronic migraine without aura, intractable, with status migrainosus: Secondary | ICD-10-CM

## 2020-04-29 DIAGNOSIS — G43709 Chronic migraine without aura, not intractable, without status migrainosus: Secondary | ICD-10-CM

## 2020-04-29 DIAGNOSIS — G43009 Migraine without aura, not intractable, without status migrainosus: Secondary | ICD-10-CM | POA: Diagnosis not present

## 2020-04-29 LAB — COMPREHENSIVE METABOLIC PANEL
ALT: 17 IU/L (ref 0–32)
AST: 16 IU/L (ref 0–40)
Albumin/Globulin Ratio: 1.8 (ref 1.2–2.2)
Albumin: 4.6 g/dL (ref 3.8–4.9)
Alkaline Phosphatase: 75 IU/L (ref 44–121)
BUN/Creatinine Ratio: 11 (ref 9–23)
BUN: 7 mg/dL (ref 6–24)
Bilirubin Total: 0.6 mg/dL (ref 0.0–1.2)
CO2: 25 mmol/L (ref 20–29)
Calcium: 9.5 mg/dL (ref 8.7–10.2)
Chloride: 104 mmol/L (ref 96–106)
Creatinine, Ser: 0.65 mg/dL (ref 0.57–1.00)
GFR calc Af Amer: 119 mL/min/{1.73_m2} (ref >59)
GFR calc non Af Amer: 103 mL/min/{1.73_m2} (ref >59)
Globulin, Total: 2.5 g/dL (ref 1.5–4.5)
Glucose: 94 mg/dL (ref 65–99)
Potassium: 4 mmol/L (ref 3.5–5.2)
Sodium: 140 mmol/L (ref 134–144)
Total Protein: 7.1 g/dL (ref 6.0–8.5)

## 2020-04-29 LAB — LIPID PANEL
Chol/HDL Ratio: 2.9 ratio (ref 0.0–4.4)
Cholesterol, Total: 201 mg/dL — ABNORMAL HIGH (ref 100–199)
HDL: 69 mg/dL (ref >39)
LDL Chol Calc (NIH): 113 mg/dL — ABNORMAL HIGH (ref 0–99)
Triglycerides: 106 mg/dL (ref 0–149)
VLDL Cholesterol Cal: 19 mg/dL (ref 5–40)

## 2020-04-29 MED ORDER — RIZATRIPTAN BENZOATE 10 MG PO TBDP: 10.0000 mg | ORAL_TABLET | ORAL | 11 refills | Status: DC | PRN

## 2020-04-29 MED ORDER — NURTEC 75 MG PO TBDP: 75.0000 mg | ORAL_TABLET | ORAL | 11 refills | Status: DC

## 2020-04-29 MED ORDER — AJOVY 225 MG/1.5ML ~~LOC~~ SOAJ: 225.0000 mg | SUBCUTANEOUS | 4 refills | Status: DC

## 2020-04-29 NOTE — Patient Instructions (Signed)
Continue Ajovy Try Nurtec every other day  Rimegepant oral dissolving tablet What is this medicine? RIMEGEPANT (ri ME je pant) is used to treat migraine headaches with or without aura. An aura is a strange feeling or visual disturbance that warns you of an attack. It is not used to prevent migraines. This medicine may be used for other purposes; ask your health care provider or pharmacist if you have questions. COMMON BRAND NAME(S): NURTEC ODT What should I tell my health care provider before I take this medicine? They need to know if you have any of these conditions:  kidney disease  liver disease  an unusual or allergic reaction to rimegepant, other medicines, foods, dyes, or preservatives  pregnant or trying to get pregnant  breast-feeding How should I use this medicine? Take the medicine by mouth. Follow the directions on the prescription label. Leave the tablet in the sealed blister pack until you are ready to take it. With dry hands, open the blister and gently remove the tablet. If the tablet breaks or crumbles, throw it away and take a new tablet out of the blister pack. Place the tablet in the mouth and allow it to dissolve, and then swallow. Do not cut, crush, or chew this medicine. You do not need water to take this medicine. Talk to your pediatrician about the use of this medicine in children. Special care may be needed. Overdosage: If you think you have taken too much of this medicine contact a poison control center or emergency room at once. NOTE: This medicine is only for you. Do not share this medicine with others. What if I miss a dose? This does not apply. This medicine is not for regular use. What may interact with this medicine? This medicine may interact with the following medications:  certain medicines for fungal infections like fluconazole, itraconazole  rifampin This list may not describe all possible interactions. Give your health care provider a list of all  the medicines, herbs, non-prescription drugs, or dietary supplements you use. Also tell them if you smoke, drink alcohol, or use illegal drugs. Some items may interact with your medicine. What should I watch for while using this medicine? Visit your health care professional for regular checks on your progress. Tell your health care professional if your symptoms do not start to get better or if they get worse. What side effects may I notice from receiving this medicine? Side effects that you should report to your doctor or health care professional as soon as possible:  allergic reactions like skin rash, itching or hives; swelling of the face, lips, or tongue Side effects that usually do not require medical attention (report these to your doctor or health care professional if they continue or are bothersome):  nausea This list may not describe all possible side effects. Call your doctor for medical advice about side effects. You may report side effects to FDA at 1-800-FDA-1088. Where should I keep my medicine? Keep out of the reach of children. Store at room temperature between 15 and 30 degrees C (59 and 86 degrees F). Throw away any unused medicine after the expiration date. NOTE: This sheet is a summary. It may not cover all possible information. If you have questions about this medicine, talk to your doctor, pharmacist, or health care provider.  2020 Elsevier/Gold Standard (2018-09-09 00:21:31)

## 2020-04-29 NOTE — Addendum Note (Signed)
Addended by: Gildardo Griffes on: 04/29/2020 05:01 PM   Modules accepted: Orders

## 2020-04-29 NOTE — Progress Notes (Signed)
[facility]    Provider:  Dr Jaynee Eagles Requesting Provider: Lorrene Reid, PA-C Primary Care Provider:  Lorrene Reid, PA-C  CC:  Migraines  Virtual Visit via Video Note  I connected with Standley Dakins on 04/29/20 at  3:00 PM EDT by a video enabled telemedicine application and verified that I am speaking with the correct person using two identifiers.  Location: Patient: home Provider: office   I discussed the limitations of evaluation and management by telemedicine and the availability of in person appointments. The patient expressed understanding and agreed to proceed.  History of Present Illness:  She is doing well on Ajovy. It has improved. 50% improvement in migraines and headaches. The ones she does have are less severe just across the forehead and mild, most of time she does not have to take anything. She has new symptom, abnormal smells after having covid. She has an MRI next month, if it continues we already have imaging scheduled recommend f/u with pcp, ENT, may also be a side effect of covid she had last month. Discussed other options, will try Nurtec qod to see if it helps any more.    Follow Up Instructions:    I discussed the assessment and treatment plan with the patient. The patient was provided an opportunity to ask questions and all were answered. The patient agreed with the plan and demonstrated an understanding of the instructions.   The patient was advised to call back or seek an in-person evaluation if the symptoms worsen or if the condition fails to improve as anticipated.  I provided 20 minutes of non-face-to-face time during this encounter.   Melvenia Beam, MD   HPI:  Pamela Lin is a 49 y.o. female here as requested by Lorrene Reid, PA-C for migraines.  Patient has a long history of daily headaches, she cannot remember when she did not have them so it has been years decades probably.  She has them so much she  gets used to it.  She has been on 7-8 or more medications per her neurologist, they would help for a little bit but not completely, she had an MRI, no causes of headaches, saw an incidental benign pituitary microadenoma.Started years or decades ago. No inciting events. They are across the forehead and more on one side than the other. Can be more unilateral. Pulsating/throbbing/pounding. She has daily headaches.8 migraine days a month. Can have photophobia/phonophobi/smells, nausea. Laying down in a dark quiet room helps, she can't watch TV, sleep deprivation can trigger, she has allergy problems and that was make them worse, ibuprofen may help a little but the other medications she tried didn't seem to help long term. Migraines can last 24-72 hours and be moderately severe to severe. It is affecting her life and work. No symptoms of sleep apnea. No hx of head trauma. Brother with migraines, mother had headaches. She has dizziness, numbness and tingling, no aura, no medication overuse, No other focal neurologic deficits, associated symptoms, inciting events or modifiable factors.   Reviewed notes, labs and imaging from outside physicians, which showed:   From a review of records, medications that patient has tried not tolerated or failed, that can be used in migraine management include: Cymbalta, nortriptyline 50 mg (ineffective and cause changes in mood), Trokendi 100 mg at bedtime it was ineffective, sertraline 100 mg daily ineffective and increasing dose caused increased headaches, sertraline 50 mg daily with tizanidine 2 mg 3 times daily ineffective, Aleve/Advil.  No significant caffeine usage,  no soda, appears to drink adequate water 80 to 100 ounces, no anxiety, some stress, chronic low back pain, adequate sleep hygiene averages 7 hours a day, migraine started in childhood but more prominent in college, bifrontal, can be nonthrobbing but also throbbing, and dull, she denied associated nausea, vomiting,  photophobia, phonophobia, osmophobia, autonomic symptoms, visual disturbance or unilateral numbness or weakness, more intense headaches occur until she falls asleep, every 2 weeks, Advil or Aleve every few weeks, sleep deprivation and back pain and emotional stress exacerbated, laying down to rest and cold compresses on forehead relieve it.  Reviewed images and agree with the following:  Cbc,cmp 01/2019: normal  MRI 12/15/2019: Brain: There is no acute infarction or intracranial hemorrhage. There is no intracranial mass, mass effect, or edema. There is no hydrocephalus or extra-axial fluid collection. Ventricles and sulci are within normal limits in size and configuration. Minimal small foci of T2 hyperintensity within the supratentorial white matter likely reflect nonspecific gliosis/demyelination of doubtful clinical significance. No abnormal enhancement.  Vascular: Major vessel flow voids at the skull base are preserved.  Skull and upper cervical spine: Normal marrow signal is preserved.  Sinuses/Orbits: Right frontoethmoid mucosal thickening with corresponding T1 hyperintensity likely reflecting inspissation. Orbits are unremarkable.  Other: There is a 5 mm T2 hyperintense lesion of the left aspect of the pituitary with enhancement. Mastoid air cells are clear.  IMPRESSION: Subcentimeter pituitary lesion likely reflecting a microadenoma.  Right frontoethmoid inflammatory changes. Nonspecific and of unknown relevance to reported headaches.  Review of Systems: Patient complains of symptoms per HPI as well as the following symptoms: headaches. Pertinent negatives and positives per HPI. All others negative.   Social History   Socioeconomic History  . Marital status: Married    Spouse name: Pamela Lin  . Number of children: 0  . Years of education: Not on file  . Highest education level: Master's degree (e.g., MA, MS, MEng, MEd, MSW, MBA)  Occupational History    Employer:  [employer]  Tobacco Use  . Smoking status: Never Smoker  . Smokeless tobacco: Never Used  . Tobacco comment: tried a few cigarettes in college   Vaping Use  . Vaping Use: Never used  Substance and Sexual Activity  . Alcohol use: Yes    Alcohol/week: 1.0 standard drink    Types: 1 Standard drinks or equivalent per week    Comment: socially  . Drug use: Never  . Sexual activity: Not Currently    Birth control/protection: None  Other Topics Concern  . Not on file  Social History Narrative   Patient is right-handed. She lives with her husband in a one level home. Stopped drinking caffeine August 2020. For exercise she walks 4-5 times per week.    Social Determinants of Health   Financial Resource Strain:   . Difficulty of Paying Living Expenses: Not on file  Food Insecurity:   . Worried About Charity fundraiser in the Last Year: Not on file  . Ran Out of Food in the Last Year: Not on file  Transportation Needs:   . Lack of Transportation (Medical): Not on file  . Lack of Transportation (Non-Medical): Not on file  Physical Activity:   . Days of Exercise per Week: Not on file  . Minutes of Exercise per Session: Not on file  Stress:   . Feeling of Stress : Not on file  Social Connections:   . Frequency of Communication with Friends and Family: Not on file  .  Frequency of Social Gatherings with Friends and Family: Not on file  . Attends Religious Services: Not on file  . Active Member of Clubs or Organizations: Not on file  . Attends Archivist Meetings: Not on file  . Marital Status: Not on file  Intimate Partner Violence:   . Fear of Current or Ex-Partner: Not on file  . Emotionally Abused: Not on file  . Physically Abused: Not on file  . Sexually Abused: Not on file    Family History  Problem Relation Age of Onset  . Cancer Mother        breast  . Breast cancer Mother   . Headache Mother        had them for a long time, less around 2-60 years  old   . Prostate cancer Father   . CAD Father        bypass surgery  . Headache Father   . Cancer Maternal Aunt        breast  . Breast cancer Maternal Aunt   . Cancer Paternal Aunt        breast  . Breast cancer Paternal Aunt   . Migraines Brother     Past Medical History:  Diagnosis Date  . Allergy   . Migraine     Patient Active Problem List   Diagnosis Date Noted  . Chronic migraine without aura, with intractable migraine, so stated, with status migrainosus 12/24/2019  . Pituitary microadenoma ([place]) 12/24/2019  . HA (headache) 01/29/2019  . Elevated LDL cholesterol level 01/29/2019  . Maxillary sinusitis 06/27/2018  . Screening for cervical cancer 02/05/2018  . Acute frontal sinusitis 02/27/2017  . Healthcare maintenance 10/12/2016  . Obesity (BMI 30-39.9) 10/12/2016  . History of environmental allergies 10/12/2016    Past Surgical History:  Procedure Laterality Date  . ADENOIDECTOMY    . CHOLECYSTECTOMY  2009    Current Outpatient Medications  Medication Sig Dispense Refill  . atorvastatin (LIPITOR) 10 MG tablet Take 1 tablet by mouth at bedtime once weekly. 90 tablet 0  . azelastine (ASTELIN) 0.1 % nasal spray Place 2 sprays into both nostrils as needed for rhinitis. Use in each nostril as directed 30 mL 3  . fluticasone (FLONASE) 50 MCG/ACT nasal spray Place 2 sprays into both nostrils as needed for allergies or rhinitis. 16 g 3  . Fremanezumab-vfrm (AJOVY) 225 MG/1.5ML SOAJ Inject 225 mg into the skin every 30 (thirty) days. 4.5 mL 4  . Multiple Vitamin (MULTIVITAMIN) tablet Take 2 tablets by mouth daily.     . polycarbophil (FIBERCON) 625 MG tablet Take 625 mg by mouth daily.    . Rimegepant Sulfate (NURTEC) 75 MG TBDP Take 75 mg by mouth every other day. 16 tablet 11  . rizatriptan (MAXALT-MLT) 10 MG disintegrating tablet Take 1 tablet (10 mg total) by mouth as needed for migraine. May repeat in 2 hours if needed 9 tablet 11   No current  facility-administered medications for this visit.    Allergies as of 04/29/2020 - Review Complete 04/28/2020  Allergen Reaction Noted  . Dust mite extract  10/12/2016  . Grass extracts [gramineae pollens]  01/29/2019  . Other  02/08/2007    Vitals: There were no vitals taken for this visit. Last Weight:  Wt Readings from Last 1 Encounters:  04/28/20 207 lb 11.2 oz (94.2 kg)   Last Height:   Ht Readings from Last 1 Encounters:  04/28/20 5\' 6"  (1.676 m)   Physical exam: Exam:  Physical exam: Exam: Gen: NAD, conversant      CV: attempted, Could not perform over Web Video. Denies palpitations or chest pain or SOB. VS: Breathing at a normal rate. Weight appears within normal limits. Not febrile. Eyes: Conjunctivae clear without exudates or hemorrhage  Neuro: Detailed Neurologic Exam  Speech:    Speech is normal; fluent and spontaneous with normal comprehension.  Cognition:    The patient is oriented to person, place, and time;     recent and remote memory intact;     language fluent;     normal attention, concentration,     fund of knowledge Cranial Nerves:    The pupils are equal, round, and reactive to light. Attempted, Cannot perform fundoscopic exam. Visual fields are full to finger confrontation. Extraocular movements are intact.  The face is symmetric with normal sensation. The palate elevates in the midline. Hearing intact. Voice is normal. Shoulder shrug is normal. The tongue has normal motion without fasciculations.   Coordination:    Normal finger to nose  Gait:    Normal native gait  Motor Observation:   no involuntary movements noted. Tone:    Appears normal  Posture:    Posture is normal. normal erect    Strength:    Strength is anti-gravity and symmetric in the upper and lower limbs.      Sensation: intact to LT     Reflex Exam:  DTR's:    Attempted, Could not perform over Web Video   Toes: Attempted Could not perform over Web Video    Clonus:   Attempted, Could not perform over Web Video   Assessment/Plan: A lovely 49 year old patient with chronic intractable migraines for years, daily headaches, at least 10 migraine days a month, she is tried and failed multiple medications (per HPI).  At this time we will start her on Ajovy and if needed Botox.  We had a long discussion about migraines, migraine management, preventative and acute treatments, nonpharmacological treatments, she has no symptoms of sleep apnea, recent MRI was essentially normal except for a pituitary microadenoma which is benign and unlikely causing any of her headache symptoms.    - Pituitary microadenoma: checked prolactin, tsh. Repeat next month - 1 year recheck with pituitary protocol, or sooner if needed - Ajovy for prevention: >50% improvement in frequency and severity. Can try to see if adding Nurtec every other day helps even more,  - Acutely: continue Rizatriptan(Maxalt): Please take one tablet at the onset of your headache. If it does not improve the symptoms please take one additional tablet. Do not take more then 2 tablets in 24hrs. Do not take use more then 2 to 3 times in a week. Bloodwork - F/u in 6 months  Discussed: To prevent or relieve headaches, try the following: Cool Compress. Lie down and place a cool compress on your head.  Avoid headache triggers. If certain foods or odors seem to have triggered your migraines in the past, avoid them. A headache diary might help you identify triggers.  Include physical activity in your daily routine. Try a daily walk or other moderate aerobic exercise.  Manage stress. Find healthy ways to cope with the stressors, such as delegating tasks on your to-do list.  Practice relaxation techniques. Try deep breathing, yoga, massage and visualization.  Eat regularly. Eating regularly scheduled meals and maintaining a healthy diet might help prevent headaches. Also, drink plenty of fluids.  Follow a regular sleep  schedule. Sleep deprivation might contribute to headaches  Consider biofeedback. With this mind-body technique, you learn to control certain bodily functions -- such as muscle tension, heart rate and blood pressure -- to prevent headaches or reduce headache pain.    Proceed to emergency room if you experience new or worsening symptoms or symptoms do not resolve, if you have new neurologic symptoms or if headache is severe, or for any concerning symptom.   Provided education and documentation from American headache Society toolbox including articles on: chronic migraine medication overuse headache, chronic migraines, prevention of migraines, behavioral and other nonpharmacologic treatments for headache.   No orders of the defined types were placed in this encounter.  Meds ordered this encounter  Medications  . Rimegepant Sulfate (NURTEC) 75 MG TBDP    Sig: Take 75 mg by mouth every other day.    Dispense:  16 tablet    Refill:  11  . Fremanezumab-vfrm (AJOVY) 225 MG/1.5ML SOAJ    Sig: Inject 225 mg into the skin every 30 (thirty) days.    Dispense:  4.5 mL    Refill:  4    3 month supply please. Patient has copay card; she can have medication for $5 regardless of insurance approval or copay amount.  . rizatriptan (MAXALT-MLT) 10 MG disintegrating tablet    Sig: Take 1 tablet (10 mg total) by mouth as needed for migraine. May repeat in 2 hours if needed    Dispense:  9 tablet    Refill:  11    Cc: Lorrene Reid, PA-C  Sarina Ill, MD  [facility] [address] [place], [place] [address]  Phone [phone] Fax [phone]

## 2020-04-29 NOTE — Telephone Encounter (Signed)
Tori, mind calling patient and getting her a follow up in 6 months with megan(or Amy) 15-minute video appointment? thanks

## 2020-05-12 ENCOUNTER — Other Ambulatory Visit: Payer: Self-pay

## 2020-05-12 ENCOUNTER — Ambulatory Visit
Admission: RE | Admit: 2020-05-12 | Discharge: 2020-05-12 | Disposition: A | Discharge status: Home / Self Care | Payer: BC Managed Care – PPO | Source: Ambulatory Visit | Attending: Neurology | Admitting: Neurology

## 2020-05-12 DIAGNOSIS — D352 Benign neoplasm of pituitary gland: Secondary | ICD-10-CM | POA: Diagnosis not present

## 2020-05-12 DIAGNOSIS — R519 Headache, unspecified: Secondary | ICD-10-CM

## 2020-05-12 MED ORDER — GADOBENATE DIMEGLUMINE 529 MG/ML IV SOLN
10.0000 mL | Freq: Once | INTRAVENOUS | Status: AC | PRN
  Administered 2020-05-12: 10 mL via INTRAVENOUS

## 2020-06-10 ENCOUNTER — Telehealth: Payer: Self-pay | Admitting: Neurology

## 2020-06-10 DIAGNOSIS — G43709 Chronic migraine without aura, not intractable, without status migrainosus: Secondary | ICD-10-CM

## 2020-06-10 DIAGNOSIS — G43009 Migraine without aura, not intractable, without status migrainosus: Secondary | ICD-10-CM

## 2020-06-10 MED ORDER — AJOVY 225 MG/1.5ML ~~LOC~~ SOAJ: 225.0000 mg | SUBCUTANEOUS | 4 refills | Status: DC

## 2020-06-10 NOTE — Telephone Encounter (Signed)
Daniel @ [facility] called to inform that they received a refill request from [place] for pt's Fremanezumab-vfrm (AJOVY) 225 MG/1.5ML SOAJ.  Quillian Quince is asking for a scrip to be faxed to them from [place].  Please fax to [phone]

## 2020-06-10 NOTE — Telephone Encounter (Addendum)
Called [facility], spoke with pharmacist Eber Jones, she stated that patient's insurance requires she get specialty medicaiton from them. It cab be e scribed, she gave me their address, requested ICD 10 code be attached.  advised will send over Ajovy Rx. She verbalized understanding, appreciation. Ajovy e scribed to [facility].

## 2020-06-10 NOTE — Addendum Note (Signed)
Addended by: Minna Antis on: 06/10/2020 10:57 AM   Modules accepted: Orders

## 2020-06-17 ENCOUNTER — Telehealth: Payer: Self-pay | Admitting: *Deleted

## 2020-06-17 NOTE — Telephone Encounter (Signed)
Completed Ajovy PA on Cover My Meds. Key: [id number]. Awaiting determination from [facility].

## 2020-06-21 NOTE — Telephone Encounter (Addendum)
Per Roosvelt Harps approved from 06/17/20-06/17/2021. Updated patient via mychart.   [facility] changed their fax number per pharmacy staff. I faxed to the new number [phone]. Received a receipt of confirmation.

## 2020-07-22 ENCOUNTER — Encounter: Payer: Self-pay | Admitting: Physician Assistant

## 2020-07-22 ENCOUNTER — Other Ambulatory Visit: Payer: Self-pay | Admitting: Physician Assistant

## 2020-07-22 MED ORDER — AZELASTINE HCL 0.1 % NA SOLN: 2.0000 | NASAL | 3 refills | Status: DC | PRN

## 2020-07-22 NOTE — Telephone Encounter (Signed)
Refills sent to pharmacy per patient request. AS, CMA

## 2020-10-26 ENCOUNTER — Encounter: Payer: Self-pay | Admitting: Adult Health

## 2020-10-26 ENCOUNTER — Telehealth (INDEPENDENT_AMBULATORY_CARE_PROVIDER_SITE_OTHER): Payer: BC Managed Care – PPO | Admitting: Adult Health

## 2020-10-26 DIAGNOSIS — G43719 Chronic migraine without aura, intractable, without status migrainosus: Secondary | ICD-10-CM

## 2020-10-26 NOTE — Progress Notes (Addendum)
PATIENT: Pamela Lin DOB: January 06, 1971  REASON FOR VISIT: follow up HISTORY FROM: patient  Virtual Visit via Video Note  I connected with Pamela Lin on 10/26/20 at  3:00 PM EDT by a video enabled telemedicine application located remotely at [facility] and verified that I am speaking with the correct person using two identifiers who was located at their own home.   I discussed the limitations of evaluation and management by telemedicine and the availability of in person appointments. The patient expressed understanding and agreed to proceed.   PATIENT: Pamela Lin DOB: February 02, 1971  REASON FOR VISIT: follow up HISTORY FROM: patient  HISTORY OF PRESENT ILLNESS: Today 10/26/20:  Pamela Lin is a 50 year old female with a history of migraine headaches.  She returns today for follow-up.  She is currently on Ajovy and Nurtec.  She reports that in the last 3 to 4 months her headache frequency has increased.  She has approximately 2-3 headaches a week that sometimes last up to 2 days.  She tries to not use the Maxalt regularly.  She has not started any new medication.  She is interested in trying a different therapy if possible.  Medications tried:Cymbalta, nortriptyline 50 mg (ineffective and cause changes in mood), Trokendi 100 mg at bedtime it was ineffective, sertraline 100 mg daily ineffective and increasing dose caused increased headaches, sertraline 50 mg daily with tizanidine 2 mg 3 times daily ineffective, Aleve/Advil. Ajovy   HISTORY She is doing well on Ajovy. It has improved. 50% improvement in migraines and headaches. The ones she does have are less severe just across the forehead and mild, most of time she does not have to take anything. She has new symptom, abnormal smells after having covid. She has an MRI next month, if it continues we already have imaging scheduled recommend f/u with pcp, ENT, may also be a side effect of covid she  had last month. Discussed other options, will try Nurtec qod to see if it helps any more.   REVIEW OF SYSTEMS: Out of a complete 14 system review of symptoms, the patient complains only of the following symptoms, and all other reviewed systems are negative.  See HPI  ALLERGIES: Allergies  Allergen Reactions  . Dust Mite Extract   . Grass Extracts [Gramineae Pollens]   . Other     Weed pollen, mold    HOME MEDICATIONS: Outpatient Medications Prior to Visit  Medication Sig Dispense Refill  . atorvastatin (LIPITOR) 10 MG tablet Take 1 tablet by mouth at bedtime once weekly. 90 tablet 0  . azelastine (ASTELIN) 0.1 % nasal spray Place 2 sprays into both nostrils as needed for rhinitis. Use in each nostril as directed 30 mL 3  . fluticasone (FLONASE) 50 MCG/ACT nasal spray Place 2 sprays into both nostrils as needed for allergies or rhinitis. 16 g 3  . Fremanezumab-vfrm (AJOVY) 225 MG/1.5ML SOAJ Inject 225 mg into the skin every 30 (thirty) days. 4.5 mL 4  . Multiple Vitamin (MULTIVITAMIN) tablet Take 2 tablets by mouth daily.     . polycarbophil (FIBERCON) 625 MG tablet Take 625 mg by mouth daily.    . Rimegepant Sulfate (NURTEC) 75 MG TBDP Take 75 mg by mouth every other day. 16 tablet 11  . rizatriptan (MAXALT-MLT) 10 MG disintegrating tablet Take 1 tablet (10 mg total) by mouth as needed for migraine. May repeat in 2 hours if needed 9 tablet 11   No facility-administered medications prior to  visit.    PAST MEDICAL HISTORY: Past Medical History:  Diagnosis Date  . Allergy   . Migraine     PAST SURGICAL HISTORY: Past Surgical History:  Procedure Laterality Date  . ADENOIDECTOMY    . CHOLECYSTECTOMY  2009    FAMILY HISTORY: Family History  Problem Relation Age of Onset  . Cancer Mother        breast  . Breast cancer Mother   . Headache Mother        had them for a long time, less around 66-73 years old   . Prostate cancer Father   . CAD Father        bypass surgery   . Headache Father   . Cancer Maternal Aunt        breast  . Breast cancer Maternal Aunt   . Cancer Paternal Aunt        breast  . Breast cancer Paternal Aunt   . Migraines Brother     SOCIAL HISTORY: Social History   Socioeconomic History  . Marital status: Married    Spouse name: [name]  . Number of children: 0  . Years of education: Not on file  . Highest education level: Master's degree (e.g., MA, MS, MEng, MEd, MSW, MBA)  Occupational History    Employer: [employer]  Tobacco Use  . Smoking status: Never Smoker  . Smokeless tobacco: Never Used  . Tobacco comment: tried a few cigarettes in college   Vaping Use  . Vaping Use: Never used  Substance and Sexual Activity  . Alcohol use: Yes    Alcohol/week: 1.0 standard drink    Types: 1 Standard drinks or equivalent per week    Comment: socially  . Drug use: Never  . Sexual activity: Not Currently    Birth control/protection: None  Other Topics Concern  . Not on file  Social History Narrative   Patient is right-handed. She lives with her husband in a one level home. Stopped drinking caffeine August 2020. For exercise she walks 4-5 times per week.    Social Determinants of Health   Financial Resource Strain: Not on file  Food Insecurity: Not on file  Transportation Needs: Not on file  Physical Activity: Not on file  Stress: Not on file  Social Connections: Not on file  Intimate Partner Violence: Not on file      PHYSICAL EXAM Generalized: Well developed, in no acute distress   Neurological examination  Mentation: Alert oriented to time, place, history taking. Follows all commands speech and language fluent Cranial nerve II-XII:Extraocular movements were full. Facial symmetry noted. uvula tongue midline. Head turning and shoulder shrug  were normal and symmetric. Motor: Good strength throughout subjectively per patient Sensory: Sensory testing is intact to soft touch on all 4 extremities  subjectively per patient Coordination: Cerebellar testing reveals good finger-nose-finger  Gait and station: Patient is able to stand from a seated position. gait is normal.  Reflexes: UTA  DIAGNOSTIC DATA (LABS, IMAGING, TESTING) - I reviewed patient records, labs, notes, testing and imaging myself where available.  Lab Results  Component Value Date   WBC 4.5 12/24/2019   HGB 14.3 12/24/2019   HCT 41.7 12/24/2019   MCV 89 12/24/2019   PLT 176 12/24/2019      Component Value Date/Time   NA 140 04/28/2020 0903   K 4.0 04/28/2020 0903   CL 104 04/28/2020 0903   CO2 25 04/28/2020 0903   GLUCOSE 94 04/28/2020 0903  GLUCOSE 101 (H) 05/06/2008 0650   BUN 7 04/28/2020 0903   CREATININE 0.65 04/28/2020 0903   CALCIUM 9.5 04/28/2020 0903   PROT 7.1 04/28/2020 0903   ALBUMIN 4.6 04/28/2020 0903   AST 16 04/28/2020 0903   ALT 17 04/28/2020 0903   ALKPHOS 75 04/28/2020 0903   BILITOT 0.6 04/28/2020 0903   GFRNONAA 103 04/28/2020 0903   GFRAA 119 04/28/2020 0903   Lab Results  Component Value Date   CHOL 201 (H) 04/28/2020   HDL 69 04/28/2020   LDLCALC 113 (H) 04/28/2020   TRIG 106 04/28/2020   CHOLHDL 2.9 04/28/2020   Lab Results  Component Value Date   HGBA1C 5.4 12/24/2019   Lab Results  Component Value Date   VITAMINB12 354 12/24/2019   Lab Results  Component Value Date   TSH 1.240 12/24/2019      ASSESSMENT AND PLAN 50 y.o. year old female  has a past medical history of Allergy and Migraine. here with:  1.  Migraine headaches  --The patient will be started on Botox therapy pending insurance approval --The patient will continue on Ajovy until Botox is approved and then she will stop the medication --Continue Nurtec 75 mg every other day Advised if symptoms worsen or she develops new symptoms she should let us know.    Ward Givens, MSN, NP-C 10/26/2020, 2:59 PM [facility] [address], [place], [place] [address] [phone]   agree with assessment and plan as stated.     Sarina Ill, MD [facility]

## 2020-11-09 NOTE — Telephone Encounter (Signed)
Received charge sheet for 200 units of Botox for G43.719 [phone]). Filled out [facility] fax form and gave to NP to sign. Faxed completed PA form to [facility] with notes.

## 2020-11-10 NOTE — Telephone Encounter (Signed)
Faxed pharmacy enrollment form to [facility].

## 2020-11-10 NOTE — Telephone Encounter (Signed)
Received approval from [facility]. Reference #[id number]. (11/09/20- 05/12/21). Patient will use [facility].  Filled out pharmacy enrollment/prescription form. Will give to NP to sign.

## 2020-11-16 NOTE — Telephone Encounter (Signed)
I called [facility] and spoke with Verdis Frederickson to check status. She states the order is still in Pharmacist, community.

## 2020-11-23 NOTE — Telephone Encounter (Signed)
Received (1) 200 unit vial of Botox today from [facility]. 

## 2020-12-01 NOTE — Telephone Encounter (Signed)
This encounter was created in error - please disregard.

## 2020-12-23 ENCOUNTER — Ambulatory Visit: Payer: BC Managed Care – PPO | Admitting: Adult Health

## 2020-12-23 ENCOUNTER — Other Ambulatory Visit: Payer: Self-pay

## 2020-12-23 DIAGNOSIS — G43709 Chronic migraine without aura, not intractable, without status migrainosus: Secondary | ICD-10-CM

## 2020-12-23 NOTE — Progress Notes (Signed)
       BOTOX PROCEDURE NOTE FOR MIGRAINE HEADACHE    Contraindications and precautions discussed with patient(above). Aseptic procedure was observed and patient tolerated procedure. Procedure performed by Ward Givens, NP  The condition has existed for more than 6 months, and pt does not have a diagnosis of ALS, Myasthenia Gravis or Lambert-Eaton Syndrome.  Risks and benefits of injections discussed and pt agrees to proceed with the procedure.  Written consent obtained  These injections are medically necessary. This is her first injection cycle. These injections do not cause sedations or hallucinations which the oral therapies may cause.  Indication/Diagnosis: chronic migraine BOTOX(J0585) injection was performed according to protocol by [facility]. 200 units of BOTOX was dissolved into 4 cc NS.   NDC: [address]-32  Type of toxin: Botox  Botox- 200 units x 1 vial Lot: [id number] Expiration: 1/25 NDC: 2542-7062-37  Bacteriostatic 0.9% Sodium Chloride- 16mL total [id number] Expiration: 12/23 NDC: 60737-106-26  Dx: R48.546 SP   Description of procedure:  The patient was placed in a sitting position. The standard protocol was used for Botox as follows, with 5 units of Botox injected at each site:   -Procerus muscle, midline injection  -Corrugator muscle, bilateral injection  -Frontalis muscle, bilateral injection, with 2 sites each side, medial injection was performed in the upper one third of the frontalis muscle, in the region vertical from the medial inferior edge of the superior orbital rim. The lateral injection was again in the upper one third of the forehead vertically above the lateral limbus of the cornea, 1.5 cm lateral to the medial injection site.  -Temporalis muscle injection, 4 sites, bilaterally. The first injection was 3 cm above the tragus of the ear, second injection site was 1.5 cm to 3 cm up from the first injection site in line with the tragus of the ear.  The third injection site was 1.5-3 cm forward between the first 2 injection sites. The fourth injection site was 1.5 cm posterior to the second injection site.  -Occipitalis muscle injection, 3 sites, bilaterally. The first injection was done one half way between the occipital protuberance and the tip of the mastoid process behind the ear. The second injection site was done lateral and superior to the first, 1 fingerbreadth from the first injection. The third injection site was 1 fingerbreadth superiorly and medially from the first injection site.  -Cervical paraspinal muscle injection, 2 sites, bilateral knee first injection site was 1 cm from the midline of the cervical spine, 3 cm inferior to the lower border of the occipital protuberance. The second injection site was 1.5 cm superiorly and laterally to the first injection site.  -Trapezius muscle injection was performed at 3 sites, bilaterally. The first injection site was in the upper trapezius muscle halfway between the inflection point of the neck, and the acromion. The second injection site was one half way between the acromion and the first injection site. The third injection was done between the first injection site and the inflection point of the neck.   Will return for repeat injection in 3 months.   A 200 units of Botox was used,any botox not used was wasted.  Ward Givens, MSN, NP-C 12/23/2020, 11:09 AM [facility] [address], [place], [place] [address] [phone]

## 2020-12-23 NOTE — Progress Notes (Signed)
Botox- 200 units x 1 vial Lot: [id number] Expiration: 1/25 NDC: 0354-6568-12  Bacteriostatic 0.9% Sodium Chloride- 48mL total [id number] Expiration: 12/23 NDC: 44967-591-63  Dx: W46.659 SP

## 2021-01-17 ENCOUNTER — Other Ambulatory Visit: Payer: BC Managed Care – PPO

## 2021-01-21 ENCOUNTER — Encounter: Payer: BC Managed Care – PPO | Admitting: Physician Assistant

## 2021-01-24 ENCOUNTER — Other Ambulatory Visit: Payer: Self-pay

## 2021-01-24 ENCOUNTER — Other Ambulatory Visit: Payer: Self-pay | Admitting: Physician Assistant

## 2021-01-24 DIAGNOSIS — Z1231 Encounter for screening mammogram for malignant neoplasm of breast: Secondary | ICD-10-CM

## 2021-01-25 ENCOUNTER — Encounter: Payer: Self-pay | Admitting: Physician Assistant

## 2021-01-25 ENCOUNTER — Other Ambulatory Visit: Payer: Self-pay

## 2021-01-25 ENCOUNTER — Other Ambulatory Visit (HOSPITAL_COMMUNITY)
Admission: RE | Admit: 2021-01-25 | Discharge: 2021-01-25 | Disposition: A | Discharge status: Home / Self Care | Payer: BC Managed Care – PPO | Source: Ambulatory Visit | Attending: Physician Assistant | Admitting: Physician Assistant

## 2021-01-25 ENCOUNTER — Ambulatory Visit (INDEPENDENT_AMBULATORY_CARE_PROVIDER_SITE_OTHER): Payer: BC Managed Care – PPO | Admitting: Physician Assistant

## 2021-01-25 VITALS — BP 114/70 | HR 58 | Temp 98.2°F | Ht 65.75 in | Wt 192.3 lb

## 2021-01-25 DIAGNOSIS — Z131 Encounter for screening for diabetes mellitus: Secondary | ICD-10-CM

## 2021-01-25 DIAGNOSIS — Z13220 Encounter for screening for lipoid disorders: Secondary | ICD-10-CM | POA: Diagnosis not present

## 2021-01-25 DIAGNOSIS — Z136 Encounter for screening for cardiovascular disorders: Secondary | ICD-10-CM

## 2021-01-25 DIAGNOSIS — Z0000 Encounter for general adult medical examination without abnormal findings: Secondary | ICD-10-CM | POA: Diagnosis not present

## 2021-01-25 DIAGNOSIS — Z124 Encounter for screening for malignant neoplasm of cervix: Secondary | ICD-10-CM | POA: Diagnosis present

## 2021-01-25 DIAGNOSIS — Z1329 Encounter for screening for other suspected endocrine disorder: Secondary | ICD-10-CM

## 2021-01-25 DIAGNOSIS — Z1231 Encounter for screening mammogram for malignant neoplasm of breast: Secondary | ICD-10-CM

## 2021-01-25 NOTE — Patient Instructions (Signed)
Preventive Care 52-52 Years Old, Female Preventive care refers to lifestyle choices and visits with your health care provider that can promote health and wellness. This includes: A yearly physical exam. This is also called an annual wellness visit. Regular dental and eye exams. Immunizations. Screening for certain conditions. Healthy lifestyle choices, such as: Eating a healthy diet. Getting regular exercise. Not using drugs or products that contain nicotine and tobacco. Limiting alcohol use. What can I expect for my preventive care visit? Physical exam Your health care provider will check your: Height and weight. These may be used to calculate your BMI (body mass index). BMI is a measurement that tells if you are at a healthy weight. Heart rate and blood pressure. Body temperature. Skin for abnormal spots. Counseling Your health care provider may ask you questions about your: Past medical problems. Family's medical history. Alcohol, tobacco, and drug use. Emotional well-being. Home life and relationship well-being. Sexual activity. Diet, exercise, and sleep habits. Work and work Statistician. Access to firearms. Method of birth control. Menstrual cycle. Pregnancy history. What immunizations do I need?  Vaccines are usually given at various ages, according to a schedule. Your health care provider will recommend vaccines for you based on your age, medicalhistory, and lifestyle or other factors, such as travel or where you work. What tests do I need? Blood tests Lipid and cholesterol levels. These may be checked every 5 years, or more often if you are over 37 years old. Hepatitis C test. Hepatitis B test. Screening Lung cancer screening. You may have this screening every year starting at age 52 if you have a 30-pack-year history of smoking and currently smoke or have quit within the past 15 years. Colorectal cancer screening. All adults should have this screening starting at  age 52 and continuing until age 3. Your health care provider may recommend screening at age 52 if you are at increased risk. You will have tests every 1-10 years, depending on your results and the type of screening test. Diabetes screening. This is done by checking your blood sugar (glucose) after you have not eaten for a while (fasting). You may have this done every 1-3 years. Mammogram. This may be done every 1-2 years. Talk with your health care provider about when you should start having regular mammograms. This may depend on whether you have a family history of breast cancer. BRCA-related cancer screening. This may be done if you have a family history of breast, ovarian, tubal, or peritoneal cancers. Pelvic exam and Pap test. This may be done every 3 years starting at age 52. Starting at age 54, this may be done every 5 years if you have a Pap test in combination with an HPV test. Other tests STD (sexually transmitted disease) testing, if you are at risk. Bone density scan. This is done to screen for osteoporosis. You may have this scan if you are at high risk for osteoporosis. Talk with your health care provider about your test results, treatment options,and if necessary, the need for more tests. Follow these instructions at home: Eating and drinking  Eat a diet that includes fresh fruits and vegetables, whole grains, lean protein, and low-fat dairy products. Take vitamin and mineral supplements as recommended by your health care provider. Do not drink alcohol if: Your health care provider tells you not to drink. You are pregnant, may be pregnant, or are planning to become pregnant. If you drink alcohol: Limit how much you have to 0-1 drink a day. Be aware  of how much alcohol is in your drink. In the [place] one drink equals one 12 oz bottle of beer (355 mL), one 5 oz glass of wine (148 mL), or one 1 oz glass of hard liquor (44 mL).  Lifestyle Take daily care of your teeth and  gums. Brush your teeth every morning and night with fluoride toothpaste. Floss one time each day. Stay active. Exercise for at least 30 minutes 5 or more days each week. Do not use any products that contain nicotine or tobacco, such as cigarettes, e-cigarettes, and chewing tobacco. If you need help quitting, ask your health care provider. Do not use drugs. If you are sexually active, practice safe sex. Use a condom or other form of protection to prevent STIs (sexually transmitted infections). If you do not wish to become pregnant, use a form of birth control. If you plan to become pregnant, see your health care provider for a prepregnancy visit. If told by your health care provider, take low-dose aspirin daily starting at age 29. Find healthy ways to cope with stress, such as: Meditation, yoga, or listening to music. Journaling. Talking to a trusted person. Spending time with friends and family. Safety Always wear your seat belt while driving or riding in a vehicle. Do not drive: If you have been drinking alcohol. Do not ride with someone who has been drinking. When you are tired or distracted. While texting. Wear a helmet and other protective equipment during sports activities. If you have firearms in your house, make sure you follow all gun safety procedures. What's next? Visit your health care provider once a year for an annual wellness visit. Ask your health care provider how often you should have your eyes and teeth checked. Stay up to date on all vaccines. This information is not intended to replace advice given to you by your health care provider. Make sure you discuss any questions you have with your healthcare provider. Document Revised: 03/30/2020 Document Reviewed: 03/07/2018 Elsevier Patient Education  2022 [employer].

## 2021-01-25 NOTE — Progress Notes (Signed)
Subjective:     Pamela Lin is a 50 y.o. female and is here for a comprehensive physical exam. The patient reports no problems.  Social History   Socioeconomic History   Marital status: Married    Spouse name: [name]   Number of children: 0   Years of education: Not on file   Highest education level: Master's degree (e.g., MA, MS, MEng, MEd, MSW, MBA)  Occupational History    Employer: [employer]  Tobacco Use   Smoking status: Never   Smokeless tobacco: Never   Tobacco comments:    tried a few cigarettes in college   Vaping Use   Vaping Use: Never used  Substance and Sexual Activity   Alcohol use: Yes    Alcohol/week: 1.0 standard drink    Types: 1 Standard drinks or equivalent per week    Comment: socially   Drug use: Never   Sexual activity: Not Currently    Birth control/protection: None  Other Topics Concern   Not on file  Social History Narrative   Patient is right-handed. She lives with her husband in a one level home. Stopped drinking caffeine August 2020. For exercise she walks 4-5 times per week.    Social Determinants of Health   Financial Resource Strain: Not on file  Food Insecurity: Not on file  Transportation Needs: Not on file  Physical Activity: Not on file  Stress: Not on file  Social Connections: Not on file  Intimate Partner Violence: Not on file   Health Maintenance  Topic Date Due   Pneumococcal Vaccine 39-50 Years old (1 - PCV) Never done   Hepatitis C Screening  Never done   COLONOSCOPY (Pts 45-74yrs Insurance coverage will need to be confirmed)  Never done   COVID-19 Vaccine (3 - Pfizer risk series) 10/27/2019   PAP SMEAR-Modifier  02/05/2021   INFLUENZA VACCINE  02/07/2021   MAMMOGRAM  03/05/2022   TETANUS/TDAP  02/06/2028   HIV Screening  Completed   Zoster Vaccines- Shingrix  Completed   HPV VACCINES  Aged Out    The following portions of the patient's history were reviewed and updated as appropriate: allergies,  current medications, past family history, past medical history, past social history, past surgical history, and problem list.  Review of Systems Pertinent items noted in HPI and remainder of comprehensive ROS otherwise negative.   Objective:    BP 114/70   Pulse (!) 50   Temp 98.2 F (36.8 C)   Ht 5' 5.75" (1.67 m)   Wt 192 lb 4.8 oz (87.2 kg)   SpO2 98%   BMI 31.27 kg/m  General appearance: alert, cooperative, and no distress Head: Normocephalic, without obvious abnormality, atraumatic Eyes: conjunctivae/corneas clear. PERRL, EOM's intact. Fundi benign. Ears: normal TM's and external ear canals both ears Nose: Nares normal. Septum midline. Mucosa normal. No drainage or sinus tenderness. Throat: lips, mucosa, and tongue normal; teeth and gums normal Neck: no adenopathy, no carotid bruit, no JVD, supple, symmetrical, trachea midline, and thyroid: normal to inspection and palpation Back: symmetric, no curvature. ROM normal. No CVA tenderness. Lungs: clear to auscultation bilaterally Breasts:  pt deferred Heart:  regular rhythm, bradycardia, no murmur Abdomen: soft, non-tender; bowel sounds normal; no masses,  no organomegaly Pelvic: cervix normal in appearance, external genitalia normal, no adnexal masses or tenderness, no cervical motion tenderness, positive findings: vaginal discharge:  normal and physiologic, and uterus normal size, shape, and consistency Extremities: extremities normal, atraumatic, no cyanosis or edema Pulses:  2+ and symmetric Skin: Skin color, texture, turgor normal. No rashes or lesions Lymph nodes: Cervical adenopathy: normal and Supraclavicular adenopathy: normal Neurologic: Grossly normal    Assessment:    Healthy female exam.     Plan:  -Will obtain fasting labs and pap today. -Scheduled for mammogram. -UTD immunizations and cologuard. Declined Hep C and HIV screenings. -Continue with good hydration, follow low fat diet and increase physical  activity as planned. -BP wnl's, pulse stable and close to patient's baseline -Continue current medication regimen. -Follow up in 1 year for CPE and FBW or sooner if needed; lab visit in 6 months for lipid panel and cmp  See After Visit Summary for Counseling Recommendations

## 2021-01-26 LAB — CBC WITH DIFFERENTIAL/PLATELET
Basophils Absolute: 0 10*3/uL (ref 0.0–0.2)
Basos: 1 %
EOS (ABSOLUTE): 0 10*3/uL (ref 0.0–0.4)
Eos: 1 %
Hematocrit: 42.7 % (ref 34.0–46.6)
Hemoglobin: 14 g/dL (ref 11.1–15.9)
Immature Grans (Abs): 0 10*3/uL (ref 0.0–0.1)
Immature Granulocytes: 0 %
Lymphocytes Absolute: 1.8 10*3/uL (ref 0.7–3.1)
Lymphs: 38 %
MCH: 30.5 pg (ref 26.6–33.0)
MCHC: 32.8 g/dL (ref 31.5–35.7)
MCV: 93 fL (ref 79–97)
Monocytes Absolute: 0.4 10*3/uL (ref 0.1–0.9)
Monocytes: 9 %
Neutrophils Absolute: 2.4 10*3/uL (ref 1.4–7.0)
Neutrophils: 51 %
Platelets: 166 10*3/uL (ref 150–450)
RBC: 4.59 x10E6/uL (ref 3.77–5.28)
RDW: 12.5 % (ref 11.7–15.4)
WBC: 4.7 10*3/uL (ref 3.4–10.8)

## 2021-01-26 LAB — COMPREHENSIVE METABOLIC PANEL
ALT: 12 IU/L (ref 0–32)
AST: 14 IU/L (ref 0–40)
Albumin/Globulin Ratio: 2 (ref 1.2–2.2)
Albumin: 4.3 g/dL (ref 3.8–4.9)
Alkaline Phosphatase: 61 IU/L (ref 44–121)
BUN/Creatinine Ratio: 16 (ref 9–23)
BUN: 10 mg/dL (ref 6–24)
Bilirubin Total: 0.6 mg/dL (ref 0.0–1.2)
CO2: 24 mmol/L (ref 20–29)
Calcium: 9.4 mg/dL (ref 8.7–10.2)
Chloride: 104 mmol/L (ref 96–106)
Creatinine, Ser: 0.64 mg/dL (ref 0.57–1.00)
Globulin, Total: 2.2 g/dL (ref 1.5–4.5)
Glucose: 89 mg/dL (ref 65–99)
Potassium: 4.6 mmol/L (ref 3.5–5.2)
Sodium: 141 mmol/L (ref 134–144)
Total Protein: 6.5 g/dL (ref 6.0–8.5)
eGFR: 106 mL/min/{1.73_m2} (ref >59)

## 2021-01-26 LAB — TSH: TSH: 1.23 u[IU]/mL (ref 0.450–4.500)

## 2021-01-26 LAB — LIPID PANEL
Chol/HDL Ratio: 2.9 ratio (ref 0.0–4.4)
Cholesterol, Total: 190 mg/dL (ref 100–199)
HDL: 66 mg/dL (ref >39)
LDL Chol Calc (NIH): 108 mg/dL — ABNORMAL HIGH (ref 0–99)
Triglycerides: 86 mg/dL (ref 0–149)
VLDL Cholesterol Cal: 16 mg/dL (ref 5–40)

## 2021-01-26 LAB — HEMOGLOBIN A1C
Est. average glucose Bld gHb Est-mCnc: 108 mg/dL
Hgb A1c MFr Bld: 5.4 % (ref 4.8–5.6)

## 2021-01-26 LAB — CYTOLOGY - PAP: Diagnosis: NEGATIVE

## 2021-02-15 ENCOUNTER — Telehealth: Payer: Self-pay | Admitting: Adult Health

## 2021-02-15 NOTE — Telephone Encounter (Signed)
Patient's next Botox appointment is 9/19. Received (1) 200 unit vial of Botox today from [facility].

## 2021-03-18 ENCOUNTER — Ambulatory Visit
Admission: RE | Admit: 2021-03-18 | Discharge: 2021-03-18 | Disposition: A | Discharge status: Home / Self Care | Payer: BC Managed Care – PPO | Source: Ambulatory Visit | Attending: Physician Assistant | Admitting: Physician Assistant

## 2021-03-18 ENCOUNTER — Other Ambulatory Visit: Payer: Self-pay

## 2021-03-28 ENCOUNTER — Ambulatory Visit: Payer: BC Managed Care – PPO | Admitting: Adult Health

## 2021-03-28 ENCOUNTER — Encounter: Payer: Self-pay | Admitting: Adult Health

## 2021-03-28 DIAGNOSIS — G43709 Chronic migraine without aura, not intractable, without status migrainosus: Secondary | ICD-10-CM

## 2021-03-28 NOTE — Progress Notes (Deleted)
       BOTOX PROCEDURE NOTE FOR MIGRAINE HEADACHE    Contraindications and precautions discussed with patient(above). Aseptic procedure was observed and patient tolerated procedure. Procedure performed by Ward Givens, NP  The condition has existed for more than 6 months, and pt does not have a diagnosis of ALS, Myasthenia Gravis or Lambert-Eaton Syndrome.  Risks and benefits of injections discussed and pt agrees to proceed with the procedure.  Written consent obtained  These injections are medically necessary. He receives good benefits from these injections***. These injections do not cause sedations or hallucinations which the oral therapies may cause.  Indication/Diagnosis: chronic migraine BOTOX(J0585) injection was performed according to protocol by [employer]. 200 units of BOTOX was dissolved into 4 cc NS.   NDC: [id number]  Type of toxin: Botox  Lot # *** EXP: ***   Description of procedure:  The patient was placed in a sitting position. The standard protocol was used for Botox as follows, with 5 units of Botox injected at each site:   -Procerus muscle, midline injection  -Corrugator muscle, bilateral injection  -Frontalis muscle, bilateral injection, with 2 sites each side, medial injection was performed in the upper one third of the frontalis muscle, in the region vertical from the medial inferior edge of the superior orbital rim. The lateral injection was again in the upper one third of the forehead vertically above the lateral limbus of the cornea, 1.5 cm lateral to the medial injection site.  -Temporalis muscle injection, 4 sites, bilaterally. The first injection was 3 cm above the tragus of the ear, second injection site was 1.5 cm to 3 cm up from the first injection site in line with the tragus of the ear. The third injection site was 1.5-3 cm forward between the first 2 injection sites. The fourth injection site was 1.5 cm posterior to the second injection  site.  -Occipitalis muscle injection, 3 sites, bilaterally. The first injection was done one half way between the occipital protuberance and the tip of the mastoid process behind the ear. The second injection site was done lateral and superior to the first, 1 fingerbreadth from the first injection. The third injection site was 1 fingerbreadth superiorly and medially from the first injection site.  -Cervical paraspinal muscle injection, 2 sites, bilateral knee first injection site was 1 cm from the midline of the cervical spine, 3 cm inferior to the lower border of the occipital protuberance. The second injection site was 1.5 cm superiorly and laterally to the first injection site.  -Trapezius muscle injection was performed at 3 sites, bilaterally. The first injection site was in the upper trapezius muscle halfway between the inflection point of the neck, and the acromion. The second injection site was one half way between the acromion and the first injection site. The third injection was done between the first injection site and the inflection point of the neck.   Will return for repeat injection in 3 months.   A 200 unit sof Botox was used, 155 units were injected, the rest of the Botox was wasted. The patient tolerated the procedure well, there were no complications of the above procedure.  Ward Givens, MSN, NP-C 03/28/2021, 9:58 AM [facility] [address] [place], [place] [address] [phone]

## 2021-03-28 NOTE — Progress Notes (Signed)
Botox- 200 units x 1 vial Lot: [id number] Expiration: 07/2023 NDC: [id number]  Bacteriostatic 0.9% Sodium Chloride- 33m total Lot: [id number]: 07/10/2022 NDC: [id number] Dx: [id number] [facility]

## 2021-03-28 NOTE — Progress Notes (Signed)
       BOTOX PROCEDURE NOTE FOR MIGRAINE HEADACHE    Contraindications and precautions discussed with patient(above). Aseptic procedure was observed and patient tolerated procedure. Procedure performed by Ward Givens, NP  The condition has existed for more than 6 months, and pt does not have a diagnosis of ALS, Myasthenia Gravis or Lambert-Eaton Syndrome.  Risks and benefits of injections discussed and pt agrees to proceed with the procedure.  Written consent obtained  These injections are medically necessary.  She reports that she received some benefit but not resolution of her headaches.  This is her second round.    Indication/Diagnosis: chronic migraine BOTOX(J0585) injection was performed according to protocol by [facility]. 200 units of BOTOX was dissolved into 4 cc NS.   NDC: [id number]  Type of toxin: Botox Botox- 200 units x 1 vial Lot: [id number] Expiration: 07/2023 NDC: [id number]  Bacteriostatic 0.9% Sodium Chloride- 22m total Lot: [id number]: 07/10/2022 NDC: [id number] Dx: [id number] [facility]  Description of procedure:  The patient was placed in a sitting position. The standard protocol was used for Botox as follows, with 5 units of Botox injected at each site:   -Procerus muscle, midline injection  -Corrugator muscle, bilateral injection  -Frontalis muscle, bilateral injection, with 2 sites each side, medial injection was performed in the upper one third of the frontalis muscle, in the region vertical from the medial inferior edge of the superior orbital rim. The lateral injection was again in the upper one third of the forehead vertically above the lateral limbus of the cornea, 1.5 cm lateral to the medial injection site.  -Temporalis muscle injection, 4 sites, bilaterally. The first injection was 3 cm above the tragus of the ear, second injection site was 1.5 cm to 3 cm up from the first injection site in line with the tragus of the ear. The  third injection site was 1.5-3 cm forward between the first 2 injection sites. The fourth injection site was 1.5 cm posterior to the second injection site.  -Occipitalis muscle injection, 3 sites, bilaterally. The first injection was done one half way between the occipital protuberance and the tip of the mastoid process behind the ear. The second injection site was done lateral and superior to the first, 1 fingerbreadth from the first injection. The third injection site was 1 fingerbreadth superiorly and medially from the first injection site.  -Cervical paraspinal muscle injection, 2 sites, bilateral knee first injection site was 1 cm from the midline of the cervical spine, 3 cm inferior to the lower border of the occipital protuberance. The second injection site was 1.5 cm superiorly and laterally to the first injection site.  -Trapezius muscle injection was performed at 3 sites, bilaterally. The first injection site was in the upper trapezius muscle halfway between the inflection point of the neck, and the acromion. The second injection site was one half way between the acromion and the first injection site. The third injection was done between the first injection site and the inflection point of the neck.   Will return for repeat injection in 3 months.   A 200 unit sof Botox was used, 155 units were injected, the rest of the Botox was wasted. The patient tolerated the procedure well, there were no complications of the above procedure.  MWard Givens MSN, NP-C 03/28/2021, 2:15 PM [facility] [address] [place] [phone]

## 2021-04-12 DIAGNOSIS — G43009 Migraine without aura, not intractable, without status migrainosus: Secondary | ICD-10-CM

## 2021-04-12 DIAGNOSIS — G43709 Chronic migraine without aura, not intractable, without status migrainosus: Secondary | ICD-10-CM

## 2021-04-12 MED ORDER — NURTEC 75 MG PO TBDP: 75.0000 mg | ORAL_TABLET | ORAL | 5 refills | Status: DC

## 2021-05-16 NOTE — Telephone Encounter (Addendum)
Patient's next Botox appointment- 06/29/21. [employer] Botox PA expired 11/3, faxed new request to [facility] with notes, pending. Botox received from [facility] 11/3.

## 2021-05-18 NOTE — Telephone Encounter (Signed)
Received approval from [facility]. PA reference #[id number] (05/16/21- 04/16/22).

## 2021-06-28 ENCOUNTER — Ambulatory Visit: Payer: BC Managed Care – PPO | Admitting: Adult Health

## 2021-06-29 ENCOUNTER — Ambulatory Visit: Payer: BC Managed Care – PPO | Admitting: Adult Health

## 2021-06-29 DIAGNOSIS — G43709 Chronic migraine without aura, not intractable, without status migrainosus: Secondary | ICD-10-CM | POA: Diagnosis not present

## 2021-06-29 DIAGNOSIS — D352 Benign neoplasm of pituitary gland: Secondary | ICD-10-CM

## 2021-06-29 NOTE — Progress Notes (Signed)
Botox- 200 units x 1 vial Lot: [id number] Expiration: 10/2023 NDC: 1540-0867-61  0.9% Sodium Chloride- 60mL total Lot: [id number] Expiration: 06/2022 NDC: 71245-809-98  Dx: P38.250 S/P

## 2021-06-29 NOTE — Progress Notes (Signed)
° ° ° °  06/29/21: Botox is helping. Hx of pituitary adenoma.  We will recheck prolactin and TSH today.  We will repeat MRI of the brain.  BOTOX PROCEDURE NOTE FOR MIGRAINE HEADACHE    Contraindications and precautions discussed with patient(above). Aseptic procedure was observed and patient tolerated procedure. Procedure performed by Ward Givens, NP  The condition has existed for more than 6 months, and pt does not have a diagnosis of ALS, Myasthenia Gravis or Lambert-Eaton Syndrome.  Risks and benefits of injections discussed and pt agrees to proceed with the procedure.  Written consent obtained   Indication/Diagnosis: chronic migraine BOTOX(J0585) injection was performed according to protocol by [facility]. 200 units of BOTOX was dissolved into 4 cc NS.   NDC: [address]-35  Type of toxin: Botox  Botox- 200 units x 1 vial Lot: [id number] Expiration: 10/2023 NDC: 9030-0923-30   0.9% Sodium Chloride- 51mL total Lot: [id number] Expiration: 06/2022 NDC: 3354-5625-63   Description of procedure:  The patient was placed in a sitting position. The standard protocol was used for Botox as follows, with 5 units of Botox injected at each site:   -Procerus muscle, midline injection  -Corrugator muscle, bilateral injection  -Frontalis muscle, bilateral injection, with 2 sites each side, medial injection was performed in the upper one third of the frontalis muscle, in the region vertical from the medial inferior edge of the superior orbital rim. The lateral injection was again in the upper one third of the forehead vertically above the lateral limbus of the cornea, 1.5 cm lateral to the medial injection site.  -Temporalis muscle injection, 4 sites, bilaterally. The first injection was 3 cm above the tragus of the ear, second injection site was 1.5 cm to 3 cm up from the first injection site in line with the tragus of the ear. The third injection site was 1.5-3 cm forward between the first 2  injection sites. The fourth injection site was 1.5 cm posterior to the second injection site.  -Occipitalis muscle injection, 3 sites, bilaterally. The first injection was done one half way between the occipital protuberance and the tip of the mastoid process behind the ear. The second injection site was done lateral and superior to the first, 1 fingerbreadth from the first injection. The third injection site was 1 fingerbreadth superiorly and medially from the first injection site.  -Cervical paraspinal muscle injection, 2 sites, bilateral knee first injection site was 1 cm from the midline of the cervical spine, 3 cm inferior to the lower border of the occipital protuberance. The second injection site was 1.5 cm superiorly and laterally to the first injection site.  -Trapezius muscle injection was performed at 3 sites, bilaterally. The first injection site was in the upper trapezius muscle halfway between the inflection point of the neck, and the acromion. The second injection site was one half way between the acromion and the first injection site. The third injection was done between the first injection site and the inflection point of the neck.   Will return for repeat injection in 3 months.   A 200 unit sof Botox was used, 155 units were injected, the rest of the Botox was wasted. The patient tolerated the procedure well, there were no complications of the above procedure.  Ward Givens, MSN, NP-C 06/29/2021, 8:40 AM [facility] [address] [place], [place] [address] [phone]

## 2021-06-30 LAB — TSH: TSH: 1.58 u[IU]/mL (ref 0.450–4.500)

## 2021-06-30 LAB — PROLACTIN: Prolactin: 9.8 ng/mL (ref 4.8–23.3)

## 2021-07-06 ENCOUNTER — Telehealth: Payer: Self-pay | Admitting: Adult Health

## 2021-07-06 NOTE — Telephone Encounter (Signed)
MR Brain w/wo contrast Ward Givens, NP [facility]: [id number] (exp. 06/30/21 to 07/29/21). Patient is scheduled at [facility] for 07/13/21.

## 2021-07-08 ENCOUNTER — Encounter: Payer: Self-pay | Admitting: Adult Health

## 2021-07-12 NOTE — Telephone Encounter (Signed)
If she feels that it is improving she can continue to monitor.  However I am happy to see her in the office if she feels it is necessary

## 2021-07-13 ENCOUNTER — Ambulatory Visit (INDEPENDENT_AMBULATORY_CARE_PROVIDER_SITE_OTHER): Payer: BC Managed Care – PPO

## 2021-07-13 ENCOUNTER — Telehealth: Payer: Self-pay

## 2021-07-13 DIAGNOSIS — D352 Benign neoplasm of pituitary gland: Secondary | ICD-10-CM

## 2021-07-13 MED ORDER — GADOBENATE DIMEGLUMINE 529 MG/ML IV SOLN
10.0000 mL | Freq: Once | INTRAVENOUS | Status: AC | PRN
  Administered 2021-07-13: 10 mL via INTRAVENOUS

## 2021-07-13 NOTE — Telephone Encounter (Signed)
PA for nurtec has been send via [employer]. Key [id number]

## 2021-07-18 NOTE — Telephone Encounter (Signed)
I called [facility] to check status on this. PA is still pending. Hopefully determination will be made in the next few business days.

## 2021-07-28 NOTE — Telephone Encounter (Signed)
Still have not received feed back on PA approval on this. I called [facility] and spoke tech who confirmed approval and confirmed pt scheduled shipment for 07/28/2021. Nothing furhter needed with this request at this time.

## 2021-08-21 ENCOUNTER — Encounter: Payer: Self-pay | Admitting: Adult Health

## 2021-09-21 ENCOUNTER — Ambulatory Visit: Payer: BC Managed Care – PPO | Admitting: Adult Health

## 2021-09-21 ENCOUNTER — Other Ambulatory Visit: Payer: Self-pay

## 2021-09-21 DIAGNOSIS — G43709 Chronic migraine without aura, not intractable, without status migrainosus: Secondary | ICD-10-CM | POA: Diagnosis not present

## 2021-09-21 NOTE — Progress Notes (Signed)
? ? ?09/21/21: Headaches have worsened. Daily headaches on the pain scale its 3-4. Takes nurtec 75 mg every other day.  She does report that she has allergies.  Reports that when she would palpate above her eyes it was sore over the last week.  She is congested today.  But states that she is not like this every day.  She does not take any daily medication for allergies.  Patient was encouraged to follow-up with her PCP as her sinuses could be contributing to daily headaches ? ?06/29/21: Botox is helping. Hx of pituitary adenoma.  We will recheck prolactin and TSH today.  We will repeat MRI of the brain. ? ?BOTOX PROCEDURE NOTE FOR MIGRAINE HEADACHE ? ? ? ?Contraindications and precautions discussed with patient(above). Aseptic procedure was observed and patient tolerated procedure. Procedure performed by Ward Givens, NP ? ?The condition has existed for more than 6 months, and pt does not have a diagnosis of ALS, Myasthenia Gravis or Lambert-Eaton Syndrome.  Risks and benefits of injections discussed and pt agrees to proceed with the procedure.  Written consent obtained ? ? ?Indication/Diagnosis: chronic migraine ?OJJKK(X3818) injection was performed according to protocol by [facility]. 200 units of BOTOX was dissolved into 4 cc NS.   ?[place]: [phone] ? ?Type of toxin: Botox ? ?Botox- 200 units x 1 vial ?Lot: [id number] ?Expiration: 03/2024 ?[place]: [phone] ?  ?Bacteriostatic 0.9% Sodium Chloride- 55m total ?Lot: [id number] ?Expiration: 02/08/2023 ?[place] [address]-44?  ?Dx: GR15.400 ? ?Description of procedure: ? ?The patient was placed in a sitting position. The standard protocol was used for Botox as follows, with 5 units of Botox injected at each site: ? ? ?-Procerus muscle, midline injection ? ?-Corrugator muscle, bilateral injection ? ?-Frontalis muscle, bilateral injection, with 2 sites each side, medial injection was performed in the upper one third of the frontalis muscle, in the region vertical from the  medial inferior edge of the superior orbital rim. The lateral injection was again in the upper one third of the forehead vertically above the lateral limbus of the cornea, 1.5 cm lateral to the medial injection site. ? ?-Temporalis muscle injection, 4 sites, bilaterally. The first injection was 3 cm above the tragus of the ear, second injection site was 1.5 cm to 3 cm up from the first injection site in line with the tragus of the ear. The third injection site was 1.5-3 cm forward between the first 2 injection sites. The fourth injection site was 1.5 cm posterior to the second injection site. ? ?-Occipitalis muscle injection, 3 sites, bilaterally. The first injection was done one half way between the occipital protuberance and the tip of the mastoid process behind the ear. The second injection site was done lateral and superior to the first, 1 fingerbreadth from the first injection. The third injection site was 1 fingerbreadth superiorly and medially from the first injection site. ? ?-Cervical paraspinal muscle injection, 2 sites, bilateral knee first injection site was 1 cm from the midline of the cervical spine, 3 cm inferior to the lower border of the occipital protuberance. The second injection site was 1.5 cm superiorly and laterally to the first injection site. ? ?-Trapezius muscle injection was performed at 3 sites, bilaterally. The first injection site was in the upper trapezius muscle halfway between the inflection point of the neck, and the acromion. The second injection site was one half way between the acromion and the first injection site. The third injection was done between the first injection site and  the inflection point of the neck. ? ? ?Will return for repeat injection in 3 months. ? ? ?A 200 unit sof Botox was used, 155 units were injected, the rest of the Botox was wasted. The patient tolerated the procedure well, there were no complications of the above procedure. ? ?Ward Givens, MSN, NP-C  09/21/2021, 8:39 AM ?[facility] ?[address], [address] ?[place], [place] [address] ?[phone] ? ?

## 2021-09-21 NOTE — Progress Notes (Signed)
Botox- 200 units x 1 vial ?Lot: [id number] ?Expiration: 03/2024 ?[place]: [phone] ? ?Bacteriostatic 0.9% Sodium Chloride- 50m total ?Lot: [id number] ?Expiration: 02/08/2023 ?[place] [address]-03? ?Dx: GX28.118 ?S/P  ? ?

## 2021-10-04 ENCOUNTER — Telehealth: Payer: Self-pay | Admitting: Adult Health

## 2021-10-04 DIAGNOSIS — G43709 Chronic migraine without aura, not intractable, without status migrainosus: Secondary | ICD-10-CM

## 2021-10-04 DIAGNOSIS — G43009 Migraine without aura, not intractable, without status migrainosus: Secondary | ICD-10-CM

## 2021-10-04 MED ORDER — NURTEC 75 MG PO TBDP: 75.0000 mg | ORAL_TABLET | ORAL | 5 refills | Status: DC

## 2021-10-04 NOTE — Telephone Encounter (Signed)
Pamela Lin @ [facility] has called for a new prescription for (NURTEC) 75 MG TBDP their ph [phone] fax#[phone] ?

## 2021-10-04 NOTE — Telephone Encounter (Signed)
Refill done electronically to [facility].   ?

## 2021-10-18 ENCOUNTER — Other Ambulatory Visit: Payer: Self-pay | Admitting: *Deleted

## 2021-10-18 DIAGNOSIS — G43709 Chronic migraine without aura, not intractable, without status migrainosus: Secondary | ICD-10-CM

## 2021-10-18 MED ORDER — BOTOX 200 UNITS IJ SOLR: INTRAMUSCULAR | 3 refills | Status: DC

## 2021-11-08 ENCOUNTER — Telehealth: Payer: Self-pay | Admitting: Adult Health

## 2021-11-08 NOTE — Telephone Encounter (Signed)
Received (1) 200 unit vial of Botox from [facility]. 

## 2021-12-14 ENCOUNTER — Ambulatory Visit: Payer: BC Managed Care – PPO | Admitting: Adult Health

## 2021-12-26 ENCOUNTER — Ambulatory Visit: Payer: BC Managed Care – PPO | Admitting: Adult Health

## 2021-12-26 ENCOUNTER — Encounter: Payer: Self-pay | Admitting: Adult Health

## 2021-12-26 VITALS — BP 122/84 | HR 84 | Ht 65.0 in | Wt 210.0 lb

## 2021-12-26 DIAGNOSIS — G43009 Migraine without aura, not intractable, without status migrainosus: Secondary | ICD-10-CM

## 2021-12-26 NOTE — Progress Notes (Signed)
12/26/21: Botox working well. Didn't really have many headaches until she was getting due for botox.  09/21/21: Headaches have worsened. Daily headaches on the pain scale its 3-4. Takes nurtec 75 mg every other day.  She does report that she has allergies.  Reports that when she would palpate above her eyes it was sore over the last week.  She is congested today.  But states that she is not like this every day.  She does not take any daily medication for allergies.  Patient was encouraged to follow-up with her PCP as her sinuses could be contributing to daily headaches  06/29/21: Botox is helping. Hx of pituitary adenoma.  We will recheck prolactin and TSH today.  We will repeat MRI of the brain.  BOTOX PROCEDURE NOTE FOR MIGRAINE HEADACHE    Contraindications and precautions discussed with patient(above). Aseptic procedure was observed and patient tolerated procedure. Procedure performed by Ward Givens, NP  The condition has existed for more than 6 months, and pt does not have a diagnosis of ALS, Myasthenia Gravis or Lambert-Eaton Syndrome.  Risks and benefits of injections discussed and pt agrees to proceed with the procedure.  Written consent obtained   Indication/Diagnosis: chronic migraine BOTOX(J0585) injection was performed according to protocol by [facility]. 200 units of BOTOX was dissolved into 4 cc NS.   NDC: [address]-12  Type of toxin: Botox  Botox- 200 units x 1 vial Lot: [id number] Expiration: 06/2024 NDC: 0947-0962-83   Bacteriostatic 0.9% Sodium Chloride- 44m total Lot: [id number] Expiration: 02/08/2023 NDC: [address]-46  Dx: GT03.546  Description of procedure:  The patient was placed in a sitting position. The standard protocol was used for Botox as follows, with 5 units of Botox injected at each site:   -Procerus muscle, midline injection  -Corrugator muscle, bilateral injection  -Frontalis muscle, bilateral injection, with 2 sites each side, medial  injection was performed in the upper one third of the frontalis muscle, in the region vertical from the medial inferior edge of the superior orbital rim. The lateral injection was again in the upper one third of the forehead vertically above the lateral limbus of the cornea, 1.5 cm lateral to the medial injection site.  -Temporalis muscle injection, 4 sites, bilaterally. The first injection was 3 cm above the tragus of the ear, second injection site was 1.5 cm to 3 cm up from the first injection site in line with the tragus of the ear. The third injection site was 1.5-3 cm forward between the first 2 injection sites. The fourth injection site was 1.5 cm posterior to the second injection site.  -Occipitalis muscle injection, 3 sites, bilaterally. The first injection was done one half way between the occipital protuberance and the tip of the mastoid process behind the ear. The second injection site was done lateral and superior to the first, 1 fingerbreadth from the first injection. The third injection site was 1 fingerbreadth superiorly and medially from the first injection site.  -Cervical paraspinal muscle injection, 2 sites, bilateral knee first injection site was 1 cm from the midline of the cervical spine, 3 cm inferior to the lower border of the occipital protuberance. The second injection site was 1.5 cm superiorly and laterally to the first injection site.  -Trapezius muscle injection was performed at 3 sites, bilaterally. The first injection site was in the upper trapezius muscle halfway between the inflection point of the neck, and the acromion. The second injection site was one half way between the acromion  and the first injection site. The third injection was done between the first injection site and the inflection point of the neck.   Will return for repeat injection in 3 months.   A 200 unit sof Botox was used, 155 units were injected, the rest of the Botox was wasted. The patient  tolerated the procedure well, there were no complications of the above procedure.  Ward Givens, MSN, NP-C 12/26/2021, 4:27 PM [facility] [address], [place], [place] [address] [phone]

## 2021-12-26 NOTE — Progress Notes (Signed)
Botox- 200 units x 1 vial Lot: [id number] Expiration: 06/2024 NDC: 9030-1499-69       S/P

## 2022-01-25 ENCOUNTER — Other Ambulatory Visit: Payer: BC Managed Care – PPO

## 2022-01-25 DIAGNOSIS — Z0000 Encounter for general adult medical examination without abnormal findings: Secondary | ICD-10-CM

## 2022-01-25 DIAGNOSIS — Z79899 Other long term (current) drug therapy: Secondary | ICD-10-CM

## 2022-01-25 DIAGNOSIS — E7800 Pure hypercholesterolemia, unspecified: Secondary | ICD-10-CM

## 2022-01-25 DIAGNOSIS — E669 Obesity, unspecified: Secondary | ICD-10-CM

## 2022-01-26 LAB — CBC
Hematocrit: 40.8 % (ref 34.0–46.6)
Hemoglobin: 13.7 g/dL (ref 11.1–15.9)
MCH: 29.9 pg (ref 26.6–33.0)
MCHC: 33.6 g/dL (ref 31.5–35.7)
MCV: 89 fL (ref 79–97)
Platelets: 179 10*3/uL (ref 150–450)
RBC: 4.58 x10E6/uL (ref 3.77–5.28)
RDW: 12.7 % (ref 11.7–15.4)
WBC: 4.6 10*3/uL (ref 3.4–10.8)

## 2022-01-26 LAB — COMPREHENSIVE METABOLIC PANEL
ALT: 16 IU/L (ref 0–32)
AST: 12 IU/L (ref 0–40)
Albumin/Globulin Ratio: 2 (ref 1.2–2.2)
Albumin: 4.4 g/dL (ref 3.8–4.9)
Alkaline Phosphatase: 67 IU/L (ref 44–121)
BUN/Creatinine Ratio: 11 (ref 9–23)
BUN: 7 mg/dL (ref 6–24)
Bilirubin Total: 0.5 mg/dL (ref 0.0–1.2)
CO2: 25 mmol/L (ref 20–29)
Calcium: 9.3 mg/dL (ref 8.7–10.2)
Chloride: 102 mmol/L (ref 96–106)
Creatinine, Ser: 0.64 mg/dL (ref 0.57–1.00)
Globulin, Total: 2.2 g/dL (ref 1.5–4.5)
Glucose: 95 mg/dL (ref 70–99)
Potassium: 3.9 mmol/L (ref 3.5–5.2)
Sodium: 140 mmol/L (ref 134–144)
Total Protein: 6.6 g/dL (ref 6.0–8.5)
eGFR: 106 mL/min/{1.73_m2} (ref >59)

## 2022-01-26 LAB — LIPID PANEL
Chol/HDL Ratio: 3.4 ratio (ref 0.0–4.4)
Cholesterol, Total: 226 mg/dL — ABNORMAL HIGH (ref 100–199)
HDL: 66 mg/dL (ref >39)
LDL Chol Calc (NIH): 133 mg/dL — ABNORMAL HIGH (ref 0–99)
Triglycerides: 152 mg/dL — ABNORMAL HIGH (ref 0–149)
VLDL Cholesterol Cal: 27 mg/dL (ref 5–40)

## 2022-01-26 LAB — TSH: TSH: 1.65 u[IU]/mL (ref 0.450–4.500)

## 2022-01-26 LAB — HEMOGLOBIN A1C
Est. average glucose Bld gHb Est-mCnc: 108 mg/dL
Hgb A1c MFr Bld: 5.4 % (ref 4.8–5.6)

## 2022-01-30 ENCOUNTER — Other Ambulatory Visit: Payer: Self-pay | Admitting: Physician Assistant

## 2022-01-30 DIAGNOSIS — Z1231 Encounter for screening mammogram for malignant neoplasm of breast: Secondary | ICD-10-CM

## 2022-01-31 ENCOUNTER — Ambulatory Visit (INDEPENDENT_AMBULATORY_CARE_PROVIDER_SITE_OTHER): Payer: BC Managed Care – PPO | Admitting: Physician Assistant

## 2022-01-31 ENCOUNTER — Encounter: Payer: Self-pay | Admitting: Physician Assistant

## 2022-01-31 VITALS — BP 122/75 | HR 60 | Ht 64.96 in | Wt 211.8 lb

## 2022-01-31 DIAGNOSIS — Z79899 Other long term (current) drug therapy: Secondary | ICD-10-CM | POA: Diagnosis not present

## 2022-01-31 DIAGNOSIS — E7800 Pure hypercholesterolemia, unspecified: Secondary | ICD-10-CM

## 2022-01-31 DIAGNOSIS — Z0000 Encounter for general adult medical examination without abnormal findings: Secondary | ICD-10-CM

## 2022-01-31 MED ORDER — ATORVASTATIN CALCIUM 10 MG PO TABS: ORAL_TABLET | ORAL | 1 refills | Status: DC

## 2022-01-31 NOTE — Progress Notes (Signed)
Complete physical exam   Patient: Pamela Lin   DOB: 12-06-1970   51 y.o. Female  MRN: [id number] Visit Date: 01/31/2022   Chief Complaint  Patient presents with   Annual Exam   Subjective    Pamela Lin is a 51 y.o. female who presents today for a complete physical exam.  She reports consuming a low fat diet.  Recently started being more active with yoga and dance.  She generally feels well. She does not have additional problems to discuss today.     Past Medical History:  Diagnosis Date   Allergy    Migraine    Past Surgical History:  Procedure Laterality Date   ADENOIDECTOMY     CHOLECYSTECTOMY  2009   Social History   Socioeconomic History   Marital status: Married    Spouse name: [name]   Number of children: 0   Years of education: Not on file   Highest education level: Master's degree (e.g., MA, MS, MEng, MEd, MSW, MBA)  Occupational History    Employer: [employer]  Tobacco Use   Smoking status: Never   Smokeless tobacco: Never   Tobacco comments:    tried a few cigarettes in college   Vaping Use   Vaping Use: Never used  Substance and Sexual Activity   Alcohol use: Yes    Alcohol/week: 1.0 standard drink of alcohol    Types: 1 Standard drinks or equivalent per week    Comment: socially   Drug use: Never   Sexual activity: Not Currently    Birth control/protection: None  Other Topics Concern   Not on file  Social History Narrative   Patient is right-handed. She lives with her husband in a one level home. Stopped drinking caffeine August 2020. For exercise she walks 4-5 times per week.    Social Determinants of Health   Financial Resource Strain: Not on file  Food Insecurity: Not on file  Transportation Needs: Not on file  Physical Activity: Not on file  Stress: Not on file  Social Connections: Not on file  Intimate Partner Violence: Not on file     Medications: Outpatient Medications Prior to Visit  Medication  Sig   azelastine (ASTELIN) 0.1 % nasal spray Place 2 sprays into both nostrils as needed for rhinitis. Use in each nostril as directed   Botulinum Toxin Type A (BOTOX) 200 units SOLR Provider to inject 155 units into the muscles of the head and neck every 12 weeks. Discard remainder.   fluticasone (FLONASE) 50 MCG/ACT nasal spray Place 2 sprays into both nostrils as needed for allergies or rhinitis.   OnabotulinumtoxinA (BOTOX IM) Inject into the muscle. Every 90 days   Rimegepant Sulfate (NURTEC) 75 MG TBDP Take 75 mg by mouth every other day.   rizatriptan (MAXALT-MLT) 10 MG disintegrating tablet Take 1 tablet (10 mg total) by mouth as needed for migraine. May repeat in 2 hours if needed   [DISCONTINUED] atorvastatin (LIPITOR) 10 MG tablet Take 1 tablet by mouth at bedtime once weekly.   No facility-administered medications prior to visit.    Review of Systems Review of Systems:  A fourteen system review of systems was performed and found to be positive as per HPI.  Last CBC Lab Results  Component Value Date   WBC 4.6 01/25/2022   HGB 13.7 01/25/2022   HCT 40.8 01/25/2022   MCV 89 01/25/2022   MCH 29.9 01/25/2022   RDW 12.7 01/25/2022   PLT 179  19/41/7408   Last metabolic panel Lab Results  Component Value Date   GLUCOSE 95 01/25/2022   NA 140 01/25/2022   K 3.9 01/25/2022   CL 102 01/25/2022   CO2 25 01/25/2022   BUN 7 01/25/2022   CREATININE 0.64 01/25/2022   EGFR 106 01/25/2022   CALCIUM 9.3 01/25/2022   PROT 6.6 01/25/2022   ALBUMIN 4.4 01/25/2022   LABGLOB 2.2 01/25/2022   AGRATIO 2.0 01/25/2022   BILITOT 0.5 01/25/2022   ALKPHOS 67 01/25/2022   AST 12 01/25/2022   ALT 16 01/25/2022   Last lipids Lab Results  Component Value Date   CHOL 226 (H) 01/25/2022   HDL 66 01/25/2022   LDLCALC 133 (H) 01/25/2022   TRIG 152 (H) 01/25/2022   CHOLHDL 3.4 01/25/2022   Last hemoglobin A1c Lab Results  Component Value Date   HGBA1C 5.4 01/25/2022   Last thyroid  functions Lab Results  Component Value Date   TSH 1.650 01/25/2022   Last vitamin D Lab Results  Component Value Date   VD25OH 33.9 10/13/2016    Objective     BP 122/75   Pulse 60   Ht 5' 4.96" (1.65 m)   Wt 211 lb 12.8 oz (96.1 kg)   SpO2 99%   BMI 35.29 kg/m  BP Readings from Last 3 Encounters:  01/31/22 122/75  12/26/21 122/84  01/25/21 114/70   Wt Readings from Last 3 Encounters:  01/31/22 211 lb 12.8 oz (96.1 kg)  12/26/21 210 lb (95.3 kg)  01/25/21 192 lb 4.8 oz (87.2 kg)    Physical Exam   General Appearance:    Alert, cooperative, in no acute distress, appears stated age   Head:    Normocephalic, without obvious abnormality, atraumatic  Eyes:    PERRL, conjunctiva/corneas clear, EOM's intact, fundi    benign, both eyes  Ears:    Normal TM's and external ear canals, both ears  Nose:   Nares normal, septum midline, mucosa normal, no drainage    or sinus tenderness  Throat:   Lips, mucosa, and tongue normal; teeth and gums normal  Neck:   Supple, symmetrical, trachea midline, no adenopathy;    thyroid:  no enlargement/tenderness/nodules; no JVD  Back:     Symmetric, no curvature, ROM normal, no CVA tenderness  Lungs:     Clear to auscultation bilaterally, respirations unlabored  Chest Wall:    No tenderness or deformity   Heart:    Normal heart rate. Normal rhythm. No murmurs, rubs, or gallops.   Breast Exam:    deferred  Abdomen:     Soft, non-tender, bowel sounds active all four quadrants,    no masses, no organomegaly  Pelvic:    deferred  Extremities:   All extremities are intact. No cyanosis or edema  Pulses:   2+ and symmetric all extremities  Skin:   Skin color, texture, turgor normal, no rashes or lesions  Lymph nodes:   Cervical and supraclavicular nodes normal  Neurologic:   CNII-XII grossly intact.     Last depression screening scores    01/31/2022   10:18 AM 01/25/2021   10:19 AM 04/28/2020    8:38 AM  PHQ 2/9 Scores  PHQ - 2 Score 0 0  0  PHQ- 9 Score 2 2 0   Last fall risk screening    01/25/2021   10:18 AM  [place] in the past year? 0  Number falls in past yr: 0  Injury  with Fall? 0  Risk for fall due to : No Fall Risks  Follow up Falls evaluation completed     No results found for any visits on 01/31/22.  Assessment & Plan    Routine Health Maintenance and Physical Exam  Exercise Activities and Dietary recommendations -Discussed heart healthy diet low in fat and carbohydrates. Recommend moderate exercise 150 mins/wk.  Immunization History  Administered Date(s) Administered   Influenza,inj,Quad PF,6+ Mos 03/21/2019   Influenza-Unspecified 04/20/2017, 04/13/2018, 03/19/2019, 04/15/2020, 05/07/2021   PFIZER(Purple Top)SARS-COV-2 Vaccination 09/08/2019, 09/29/2019   Tdap 10/09/2007, 02/05/2018   Zoster Recombinat (Shingrix) 01/08/2020, 04/28/2020    Health Maintenance  Topic Date Due   Hepatitis C Screening  Never done   COVID-19 Vaccine (3 - Pfizer risk series) 10/27/2019   INFLUENZA VACCINE  02/07/2022   MAMMOGRAM  03/19/2023   PAP SMEAR-Modifier  01/26/2024   TETANUS/TDAP  02/06/2028   COLONOSCOPY (Pts 45-73yr Insurance coverage will need to be confirmed)  02/10/2030   HIV Screening  Completed   Zoster Vaccines- Shingrix  Completed   HPV VACCINES  Aged Out    Discussed health benefits of physical activity, and encouraged her to engage in regular exercise appropriate for her age and condition.  Problem List Items Addressed This Visit       Other   Healthcare maintenance - Primary   Relevant Orders   ABO   Elevated LDL cholesterol level   Relevant Medications   atorvastatin (LIPITOR) 10 MG tablet   Other Relevant Orders   Lipid Profile   Comp Met (CMET)   Other Visit Diagnoses     On statin therapy       Relevant Orders   Lipid Profile   Comp Met (CMET)      Discussed with patient most recent lab results which are essentially within normal limits or stable from  prior. Triglycerides are elevated and bad cholesterol higher than previously. Patient ran out of Lipitor 2 weeks ago. Discussed increasing Lipitor to twice weekly and monitoring carbohydrate/sugar intake for triglycerides. Will repeat lipid panel and hepatic function in 4 months. Patient wants ABO lab, is aware of possible out-of-pocket cost. UTD pap, colonoscopy, immunizations. Scheduled for mammogram 03/20/22. Recommend to trial Claritin or Allegra for seasonal allergies. Alternatives include Zyrtec and Xyzal which I recommend to take at bedtime. Continue to follow-up with Neurology.    Return in about 1 year (around 02/01/2023) for CPE and FBW; lab visit in 4 months for lipid panel, cmp, ABO.       MLorrene Reid PA-C  [phone](phone) [phone](fax)  CL[place]say

## 2022-01-31 NOTE — Patient Instructions (Signed)
Preventive Care 40-53 Years Old, Female Preventive care refers to lifestyle choices and visits with your health care provider that can promote health and wellness. Preventive care visits are also called wellness exams. What can I expect for my preventive care visit? Counseling Your health care provider may ask you questions about your: Medical history, including: Past medical problems. Family medical history. Pregnancy history. Current health, including: Menstrual cycle. Method of birth control. Emotional well-being. Home life and relationship well-being. Sexual activity and sexual health. Lifestyle, including: Alcohol, nicotine or tobacco, and drug use. Access to firearms. Diet, exercise, and sleep habits. Work and work environment. Sunscreen use. Safety issues such as seatbelt and bike helmet use. Physical exam Your health care provider will check your: Height and weight. These may be used to calculate your BMI (body mass index). BMI is a measurement that tells if you are at a healthy weight. Waist circumference. This measures the distance around your waistline. This measurement also tells if you are at a healthy weight and may help predict your risk of certain diseases, such as type 2 diabetes and high blood pressure. Heart rate and blood pressure. Body temperature. Skin for abnormal spots. What immunizations do I need?  Vaccines are usually given at various ages, according to a schedule. Your health care provider will recommend vaccines for you based on your age, medical history, and lifestyle or other factors, such as travel or where you work. What tests do I need? Screening Your health care provider may recommend screening tests for certain conditions. This may include: Lipid and cholesterol levels. Diabetes screening. This is done by checking your blood sugar (glucose) after you have not eaten for a while (fasting). Pelvic exam and Pap test. Hepatitis B test. Hepatitis C  test. HIV (human immunodeficiency virus) test. STI (sexually transmitted infection) testing, if you are at risk. Lung cancer screening. Colorectal cancer screening. Mammogram. Talk with your health care provider about when you should start having regular mammograms. This may depend on whether you have a family history of breast cancer. BRCA-related cancer screening. This may be done if you have a family history of breast, ovarian, tubal, or peritoneal cancers. Bone density scan. This is done to screen for osteoporosis. Talk with your health care provider about your test results, treatment options, and if necessary, the need for more tests. Follow these instructions at home: Eating and drinking  Eat a diet that includes fresh fruits and vegetables, whole grains, lean protein, and low-fat dairy products. Take vitamin and mineral supplements as recommended by your health care provider. Do not drink alcohol if: Your health care provider tells you not to drink. You are pregnant, may be pregnant, or are planning to become pregnant. If you drink alcohol: Limit how much you have to 0-1 drink a day. Know how much alcohol is in your drink. In the [place] one drink equals one 12 oz bottle of beer (355 mL), one 5 oz glass of wine (148 mL), or one 1 oz glass of hard liquor (44 mL). Lifestyle Brush your teeth every morning and night with fluoride toothpaste. Floss one time each day. Exercise for at least 30 minutes 5 or more days each week. Do not use any products that contain nicotine or tobacco. These products include cigarettes, chewing tobacco, and vaping devices, such as e-cigarettes. If you need help quitting, ask your health care provider. Do not use drugs. If you are sexually active, practice safe sex. Use a condom or other form of protection to   prevent STIs. If you do not wish to become pregnant, use a form of birth control. If you plan to become pregnant, see your health care provider for a  prepregnancy visit. Take aspirin only as told by your health care provider. Make sure that you understand how much to take and what form to take. Work with your health care provider to find out whether it is safe and beneficial for you to take aspirin daily. Find healthy ways to manage stress, such as: Meditation, yoga, or listening to music. Journaling. Talking to a trusted person. Spending time with friends and family. Minimize exposure to UV radiation to reduce your risk of skin cancer. Safety Always wear your seat belt while driving or riding in a vehicle. Do not drive: If you have been drinking alcohol. Do not ride with someone who has been drinking. When you are tired or distracted. While texting. If you have been using any mind-altering substances or drugs. Wear a helmet and other protective equipment during sports activities. If you have firearms in your house, make sure you follow all gun safety procedures. Seek help if you have been physically or sexually abused. What's next? Visit your health care provider once a year for an annual wellness visit. Ask your health care provider how often you should have your eyes and teeth checked. Stay up to date on all vaccines. This information is not intended to replace advice given to you by your health care provider. Make sure you discuss any questions you have with your health care provider. Document Revised: 12/22/2020 Document Reviewed: 12/22/2020 Elsevier Patient Education  [place].

## 2022-02-01 NOTE — Telephone Encounter (Signed)
Received (1) 200 unit vial of Botox from [facility].

## 2022-02-24 ENCOUNTER — Encounter: Payer: Self-pay | Admitting: Adult Health

## 2022-02-24 DIAGNOSIS — G43009 Migraine without aura, not intractable, without status migrainosus: Secondary | ICD-10-CM

## 2022-02-24 DIAGNOSIS — G43709 Chronic migraine without aura, not intractable, without status migrainosus: Secondary | ICD-10-CM

## 2022-02-27 MED ORDER — RIZATRIPTAN BENZOATE 10 MG PO TBDP: 10.0000 mg | ORAL_TABLET | ORAL | 11 refills | Status: DC | PRN

## 2022-02-27 NOTE — Telephone Encounter (Signed)
Ok to refill 

## 2022-02-27 NOTE — Addendum Note (Signed)
Addended by: Gildardo Griffes on: 02/27/2022 10:21 AM   Modules accepted: Orders

## 2022-03-20 ENCOUNTER — Ambulatory Visit
Admission: RE | Admit: 2022-03-20 | Discharge: 2022-03-20 | Disposition: A | Discharge status: Home / Self Care | Payer: BC Managed Care – PPO | Source: Ambulatory Visit | Attending: Physician Assistant | Admitting: Physician Assistant

## 2022-03-20 ENCOUNTER — Ambulatory Visit: Payer: BC Managed Care – PPO | Admitting: Adult Health

## 2022-03-20 DIAGNOSIS — G43709 Chronic migraine without aura, not intractable, without status migrainosus: Secondary | ICD-10-CM

## 2022-03-20 DIAGNOSIS — Z1231 Encounter for screening mammogram for malignant neoplasm of breast: Secondary | ICD-10-CM

## 2022-03-20 MED ORDER — ONABOTULINUMTOXINA 200 UNITS IJ SOLR
155.0000 [IU] | Freq: Once | INTRAMUSCULAR | Status: AC
  Administered 2022-03-20: 155 [IU] via INTRAMUSCULAR

## 2022-03-20 NOTE — Progress Notes (Signed)
03/20/22: Migraines still under good control.   12/26/21: Botox working well. Didn't really have many headaches until she was getting due for botox.  09/21/21: Headaches have worsened. Daily headaches on the pain scale its 3-4. Takes nurtec 75 mg every other day.  She does report that she has allergies.  Reports that when she would palpate above her eyes it was sore over the last week.  She is congested today.  But states that she is not like this every day.  She does not take any daily medication for allergies.  Patient was encouraged to follow-up with her PCP as her sinuses could be contributing to daily headaches  06/29/21: Botox is helping. Hx of pituitary adenoma.  We will recheck prolactin and TSH today.  We will repeat MRI of the brain.  BOTOX PROCEDURE NOTE FOR MIGRAINE HEADACHE    Contraindications and precautions discussed with patient(above). Aseptic procedure was observed and patient tolerated procedure. Procedure performed by Ward Givens, NP  The condition has existed for more than 6 months, and pt does not have a diagnosis of ALS, Myasthenia Gravis or Lambert-Eaton Syndrome.  Risks and benefits of injections discussed and pt agrees to proceed with the procedure.  Written consent obtained   Indication/Diagnosis: chronic migraine BOTOX(J0585) injection was performed according to protocol by [employer]. 200 units of BOTOX was dissolved into 4 cc NS.   NDC: [address]-94  Type of toxin: Botox Botox- 200 units x 1 vial Lot: [id number] Expiration: 08/2024 NDC: 8003-4917-91   Bacteriostatic 0.9% Sodium Chloride- 49m total Lot: [id number]: 02/08/2023 NDC: [address]-37  Dx: GS82.707  Description of procedure:  The patient was placed in a sitting position. The standard protocol was used for Botox as follows, with 5 units of Botox injected at each site:   -Procerus muscle, midline injection  -Corrugator muscle, bilateral injection  -Frontalis muscle, bilateral  injection, with 2 sites each side, medial injection was performed in the upper one third of the frontalis muscle, in the region vertical from the medial inferior edge of the superior orbital rim. The lateral injection was again in the upper one third of the forehead vertically above the lateral limbus of the cornea, 1.5 cm lateral to the medial injection site.  -Temporalis muscle injection, 4 sites, bilaterally. The first injection was 3 cm above the tragus of the ear, second injection site was 1.5 cm to 3 cm up from the first injection site in line with the tragus of the ear. The third injection site was 1.5-3 cm forward between the first 2 injection sites. The fourth injection site was 1.5 cm posterior to the second injection site.  -Occipitalis muscle injection, 3 sites, bilaterally. The first injection was done one half way between the occipital protuberance and the tip of the mastoid process behind the ear. The second injection site was done lateral and superior to the first, 1 fingerbreadth from the first injection. The third injection site was 1 fingerbreadth superiorly and medially from the first injection site.  -Cervical paraspinal muscle injection, 2 sites, bilateral knee first injection site was 1 cm from the midline of the cervical spine, 3 cm inferior to the lower border of the occipital protuberance. The second injection site was 1.5 cm superiorly and laterally to the first injection site.  -Trapezius muscle injection was performed at 3 sites, bilaterally. The first injection site was in the upper trapezius muscle halfway between the inflection point of the neck, and the acromion. The second injection site  was one half way between the acromion and the first injection site. The third injection was done between the first injection site and the inflection point of the neck.   Will return for repeat injection in 3 months.   A 200 unit sof Botox was used, 155 units were injected, the rest of  the Botox was wasted. The patient tolerated the procedure well, there were no complications of the above procedure.  Ward Givens, MSN, NP-C 03/20/2022, 3:12 PM [facility] [address], [place],  [address] [phone]

## 2022-03-20 NOTE — Progress Notes (Signed)
Botox- 200 units x 1 vial Lot: [id number] Expiration: 08/2024 NDC: 6580-0634-94  Bacteriostatic 0.9% Sodium Chloride- 4m total Lot: [id number]: 02/08/2023 NDC: [address]-78 Dx: [id number]/P

## 2022-04-04 ENCOUNTER — Other Ambulatory Visit: Payer: Self-pay

## 2022-04-04 DIAGNOSIS — G43009 Migraine without aura, not intractable, without status migrainosus: Secondary | ICD-10-CM

## 2022-04-04 DIAGNOSIS — G43709 Chronic migraine without aura, not intractable, without status migrainosus: Secondary | ICD-10-CM

## 2022-04-04 MED ORDER — NURTEC 75 MG PO TBDP: 75.0000 mg | ORAL_TABLET | ORAL | 5 refills | Status: DC

## 2022-05-31 ENCOUNTER — Other Ambulatory Visit: Payer: BC Managed Care – PPO

## 2022-05-31 DIAGNOSIS — Z0000 Encounter for general adult medical examination without abnormal findings: Secondary | ICD-10-CM

## 2022-05-31 DIAGNOSIS — E7800 Pure hypercholesterolemia, unspecified: Secondary | ICD-10-CM

## 2022-05-31 DIAGNOSIS — Z79899 Other long term (current) drug therapy: Secondary | ICD-10-CM

## 2022-06-01 LAB — ABO

## 2022-06-01 LAB — COMPREHENSIVE METABOLIC PANEL
ALT: 16 IU/L (ref 0–32)
AST: 15 IU/L (ref 0–40)
Albumin/Globulin Ratio: 1.8 (ref 1.2–2.2)
Albumin: 4.4 g/dL (ref 3.8–4.9)
Alkaline Phosphatase: 69 IU/L (ref 44–121)
BUN/Creatinine Ratio: 14 (ref 9–23)
BUN: 10 mg/dL (ref 6–24)
Bilirubin Total: 0.5 mg/dL (ref 0.0–1.2)
CO2: 25 mmol/L (ref 20–29)
Calcium: 9.4 mg/dL (ref 8.7–10.2)
Chloride: 106 mmol/L (ref 96–106)
Creatinine, Ser: 0.71 mg/dL (ref 0.57–1.00)
Globulin, Total: 2.4 g/dL (ref 1.5–4.5)
Glucose: 99 mg/dL (ref 70–99)
Potassium: 4.7 mmol/L (ref 3.5–5.2)
Sodium: 143 mmol/L (ref 134–144)
Total Protein: 6.8 g/dL (ref 6.0–8.5)
eGFR: 102 mL/min/{1.73_m2} (ref >59)

## 2022-06-01 LAB — LIPID PANEL
Chol/HDL Ratio: 2.8 ratio (ref 0.0–4.4)
Cholesterol, Total: 182 mg/dL (ref 100–199)
HDL: 65 mg/dL (ref >39)
LDL Chol Calc (NIH): 101 mg/dL — ABNORMAL HIGH (ref 0–99)
Triglycerides: 87 mg/dL (ref 0–149)
VLDL Cholesterol Cal: 16 mg/dL (ref 5–40)

## 2022-06-11 NOTE — Progress Notes (Signed)
Please let her know that her labs looked good. Cholesterol improved. LDL is 1010 with remaining panel normal. Blood type is A, but no rH factor was ordered.  Thanks so much.   -HB

## 2022-06-12 ENCOUNTER — Telehealth: Payer: Self-pay | Admitting: Adult Health

## 2022-06-12 NOTE — Telephone Encounter (Signed)
A PA is needed for Botox 200 units. Diagnosis code: G74.709

## 2022-06-12 NOTE — Telephone Encounter (Signed)
Pt is scheduled for botox injection on 06/20/22 and PA for botox expired on 04/16/22. Pt will need new PA before botox appointment

## 2022-06-13 NOTE — Telephone Encounter (Signed)
Submitted a Prior Authorization request to [facility] for  Botox 200 unit  via CoverMyMeds. Will update once we receive a response.  Key: [id number]  Also, submitted for updated Botox One Benefits investigation

## 2022-06-15 ENCOUNTER — Other Ambulatory Visit (HOSPITAL_COMMUNITY): Payer: Self-pay

## 2022-06-15 NOTE — Telephone Encounter (Signed)
Patient Advocate Encounter  Prior Authorization for Botox 200UNIT solution has been approved.     Effective dates: 06/13/2022 through 05/14/2023  [employer] [facility] [place] [facility], [place] Patient Advocate Specialist [place] Patient Advocate Team Direct Number: [phone]  Fax: [phone]

## 2022-06-15 NOTE — Telephone Encounter (Signed)
Please schedule patient for Botox.

## 2022-06-16 NOTE — Telephone Encounter (Signed)
Pt scheduled for botox with Ward Givens on 06/20/22 at 8:00am

## 2022-06-20 ENCOUNTER — Telehealth: Payer: Self-pay | Admitting: Adult Health

## 2022-06-20 ENCOUNTER — Ambulatory Visit: Payer: BC Managed Care – PPO | Admitting: Adult Health

## 2022-06-20 NOTE — Telephone Encounter (Signed)
Provider cancelled need to reschedule botox

## 2022-06-29 ENCOUNTER — Encounter: Payer: Self-pay | Admitting: Adult Health

## 2022-06-29 ENCOUNTER — Ambulatory Visit: Payer: BC Managed Care – PPO | Admitting: Adult Health

## 2022-06-29 DIAGNOSIS — G43709 Chronic migraine without aura, not intractable, without status migrainosus: Secondary | ICD-10-CM | POA: Diagnosis not present

## 2022-06-29 MED ORDER — ONABOTULINUMTOXINA 200 UNITS IJ SOLR
155.0000 [IU] | Freq: Once | INTRAMUSCULAR | Status: AC
  Administered 2022-06-29: 155 [IU] via INTRAMUSCULAR

## 2022-06-29 NOTE — Progress Notes (Signed)
Botox- 200 units x 1 vial Lot: [id number] Expiration: 11/2024 NDC: 9290-9030-14  Bacteriostatic 0.9% Sodium Chloride- 104m total Lot: [id number]: 03/2023 NDC: [address]-44 Dx: [id number]/B

## 2022-06-29 NOTE — Progress Notes (Signed)
06/29/22: Botox continues to work well.   03/20/22: Migraines still under good control.   12/26/21: Botox working well. Didn't really have many headaches until she was getting due for botox.  09/21/21: Headaches have worsened. Daily headaches on the pain scale its 3-4. Takes nurtec 75 mg every other day.  She does report that she has allergies.  Reports that when she would palpate above her eyes it was sore over the last week.  She is congested today.  But states that she is not like this every day.  She does not take any daily medication for allergies.  Patient was encouraged to follow-up with her PCP as her sinuses could be contributing to daily headaches  06/29/21: Botox is helping. Hx of pituitary adenoma.  We will recheck prolactin and TSH today.  We will repeat MRI of the brain.  BOTOX PROCEDURE NOTE FOR MIGRAINE HEADACHE    Contraindications and precautions discussed with patient(above). Aseptic procedure was observed and patient tolerated procedure. Procedure performed by Ward Givens, NP  The condition has existed for more than 6 months, and pt does not have a diagnosis of ALS, Myasthenia Gravis or Lambert-Eaton Syndrome.  Risks and benefits of injections discussed and pt agrees to proceed with the procedure.  Written consent obtained   Indication/Diagnosis: chronic migraine BOTOX(J0585) injection was performed according to protocol by [employer]. 200 units of BOTOX was dissolved into 4 cc NS.   NDC: [address]-36    Botox- 200 units x 1 vial Lot: [id number] Expiration: 11/2024 NDC: 0086-7619-50   Bacteriostatic 0.9% Sodium Chloride- 14m total Lot: [id number]: 03/2023 NDC: [address]-33  Dx: [id number]      Description of procedure:  The patient was placed in a sitting position. The standard protocol was used for Botox as follows, with 5 units of Botox injected at each site:   -Procerus muscle, midline injection  -Corrugator muscle, bilateral  injection  -Frontalis muscle, bilateral injection, with 2 sites each side, medial injection was performed in the upper one third of the frontalis muscle, in the region vertical from the medial inferior edge of the superior orbital rim. The lateral injection was again in the upper one third of the forehead vertically above the lateral limbus of the cornea, 1.5 cm lateral to the medial injection site.  -Temporalis muscle injection, 4 sites, bilaterally. The first injection was 3 cm above the tragus of the ear, second injection site was 1.5 cm to 3 cm up from the first injection site in line with the tragus of the ear. The third injection site was 1.5-3 cm forward between the first 2 injection sites. The fourth injection site was 1.5 cm posterior to the second injection site.  -Occipitalis muscle injection, 3 sites, bilaterally. The first injection was done one half way between the occipital protuberance and the tip of the mastoid process behind the ear. The second injection site was done lateral and superior to the first, 1 fingerbreadth from the first injection. The third injection site was 1 fingerbreadth superiorly and medially from the first injection site.  -Cervical paraspinal muscle injection, 2 sites, bilateral knee first injection site was 1 cm from the midline of the cervical spine, 3 cm inferior to the lower border of the occipital protuberance. The second injection site was 1.5 cm superiorly and laterally to the first injection site.  -Trapezius muscle injection was performed at 3 sites, bilaterally. The first injection site was in the upper trapezius muscle halfway between the inflection point of  the neck, and the acromion. The second injection site was one half way between the acromion and the first injection site. The third injection was done between the first injection site and the inflection point of the neck.   Will return for repeat injection in 3 months.   A 200 unit sof Botox was  used, 155 units were injected, the rest of the Botox was wasted. The patient tolerated the procedure well, there were no complications of the above procedure.  Ward Givens, MSN, NP-C 06/29/2022, 3:17 PM [facility] [address] [place], [place] [address] [phone]

## 2022-07-14 ENCOUNTER — Other Ambulatory Visit: Payer: Self-pay | Admitting: Nurse Practitioner

## 2022-07-14 DIAGNOSIS — E7800 Pure hypercholesterolemia, unspecified: Secondary | ICD-10-CM

## 2022-08-15 ENCOUNTER — Telehealth: Payer: Self-pay | Admitting: Adult Health

## 2022-08-15 NOTE — Telephone Encounter (Signed)
Connie/ [facility] is calling. Stated she is following up on the PA for Rimegepant Sulfate (NURTEC) 75 MG TBDP. She is requesting a call back.

## 2022-08-15 NOTE — Telephone Encounter (Signed)
Patient needs PA for Nurtec completed.

## 2022-08-29 ENCOUNTER — Other Ambulatory Visit (HOSPITAL_COMMUNITY): Payer: Self-pay

## 2022-08-29 NOTE — Telephone Encounter (Signed)
Patient Advocate Encounter   Received notification that prior authorization for Nurtec 75MG dispersible tablets is required.   PA submitted on 08/29/2022 Key [id number] Status is pending       Lyndel Safe, [place] Patient Advocate Specialist [place] Patient Advocate Team Direct Number: [phone]  Fax: [phone]

## 2022-08-31 ENCOUNTER — Other Ambulatory Visit: Payer: Self-pay | Admitting: *Deleted

## 2022-08-31 ENCOUNTER — Encounter: Payer: Self-pay | Admitting: Adult Health

## 2022-08-31 DIAGNOSIS — G43709 Chronic migraine without aura, not intractable, without status migrainosus: Secondary | ICD-10-CM

## 2022-08-31 DIAGNOSIS — G43009 Migraine without aura, not intractable, without status migrainosus: Secondary | ICD-10-CM

## 2022-08-31 MED ORDER — NURTEC 75 MG PO TBDP: 75.0000 mg | ORAL_TABLET | ORAL | 5 refills | Status: DC

## 2022-09-01 NOTE — Telephone Encounter (Signed)
Patient Advocate Encounter  Prior Authorization for Nurtec '75MG'$  dispersible tablets has been approved.    PA# [id number] Effective dates: 08/29/2022 through 08/30/2023      Lyndel Safe, [place] Patient Advocate Specialist [place] Patient Advocate Team Direct Number: [phone]  Fax: [phone]

## 2022-09-04 ENCOUNTER — Telehealth: Payer: Self-pay | Admitting: Adult Health

## 2022-09-04 NOTE — Telephone Encounter (Signed)
Returned call back to [employer] and confirmed pt has Nurtec prescription but not Ajovy. Ajovy d/c'd in 2022.

## 2022-09-04 NOTE — Telephone Encounter (Signed)
Noted. We can discuss other options if she would like?

## 2022-09-04 NOTE — Telephone Encounter (Signed)
Records show Ajovy was discontinued on 01/25/21

## 2022-09-04 NOTE — Telephone Encounter (Signed)
Shirlee Limerick is calling from [employer]. Stated she needs to verify if pt is still taking Ajovy. She is requesting a call back from nurse.

## 2022-09-20 ENCOUNTER — Ambulatory Visit: Payer: BC Managed Care – PPO | Admitting: Family Medicine

## 2022-09-20 ENCOUNTER — Encounter: Payer: Self-pay | Admitting: Family Medicine

## 2022-09-20 VITALS — BP 121/78 | HR 72 | Ht 64.0 in | Wt 211.0 lb

## 2022-09-20 DIAGNOSIS — S76212A Strain of adductor muscle, fascia and tendon of left thigh, initial encounter: Secondary | ICD-10-CM | POA: Diagnosis not present

## 2022-09-20 DIAGNOSIS — M25512 Pain in left shoulder: Secondary | ICD-10-CM | POA: Diagnosis not present

## 2022-09-20 DIAGNOSIS — M6289 Other specified disorders of muscle: Secondary | ICD-10-CM

## 2022-09-20 MED ORDER — CYCLOBENZAPRINE HCL 5 MG PO TABS: 5.0000 mg | ORAL_TABLET | Freq: Three times a day (TID) | ORAL | 1 refills | Status: DC | PRN

## 2022-09-20 NOTE — Progress Notes (Signed)
Acute Office Visit  Subjective:     Patient ID: Pamela Lin, female    DOB: August 09, 1970, 52 y.o.   MRN: [id number]  Chief Complaint  Patient presents with   Groin Pain    Left side   Shoulder Pain    Left side    HPI Patient is in today for groin pain and shoulder pain. Groin Pain: Patient complains of left groin pain. Onset of the symptoms was several months ago. Inciting event: none. Current symptoms include  a feeling of decreased flexibility and tight muscles, pain when trying to cross her left leg over her right . Associated symptoms: none. Aggravating symptoms:  Sudden movement, crossing left leg over right leg . Patient's overall course: gradually worsening. Patient has had no prior similar problems.  She has tried some stretching but otherwise no conservative measures.   Shoulder Pain: Patient complaints of left shoulder pain. This is evaluated as a personal injury. The pain is described as dull.  The onset of the pain was gradual, starting about 2 months ago.  The pain occurs  occasionally if she moves her arm a certain way . Location is anterior. No history of dislocation. Symptoms are aggravated by leaning forward on her elbows when sitting down.  She cannot pinpoint when or why it started.  She has been walking her dogs more recently and one of them does pull on the leash.  The pain also results in some tingling in her hand.  ROS See HPI    Objective:    BP 121/78   Pulse 72   Ht '5\' 4"'$  (1.626 m)   Wt 211 lb (95.7 kg)   SpO2 97%   BMI 36.22 kg/m   Physical Exam Constitutional:      General: She is not in acute distress.    Appearance: Normal appearance.  HENT:     Head: Normocephalic and atraumatic.  Cardiovascular:     Rate and Rhythm: Normal rate and regular rhythm.     Pulses: Normal pulses.     Heart sounds: No murmur heard.    No friction rub. No gallop.  Pulmonary:     Effort: Pulmonary effort is normal. No respiratory distress.     Breath  sounds: No wheezing, rhonchi or rales.  Musculoskeletal:     Right shoulder: Normal. No swelling. Normal range of motion.     Left shoulder: Normal. No swelling, tenderness or bony tenderness. Normal range of motion. Normal strength.     Right hip: Normal. No tenderness or bony tenderness.     Left hip: Normal. No tenderness or bony tenderness.     Right upper leg: Normal. No edema, tenderness or bony tenderness.     Left upper leg: Normal. No edema, tenderness or bony tenderness.     Comments: No pain with straight leg raise or resisted hip flexion. Strength 5 of 5 bilaterally with hip and knee flexion/extension.  Strength 5 of 5 with left leg abduction and abduction.  No pain with adduction or abduction unless leg is also slightly in front of right leg.  No pain with passive range of motion.  Skin:    General: Skin is warm and dry.  Neurological:     Mental Status: She is alert and oriented to person, place, and time.      Assessment & Plan:  Strain of adductor magnus muscle of left lower extremity, initial encounter -     Cyclobenzaprine HCl; Take 1 tablet (5 mg  total) by mouth 3 (three) times daily as needed for muscle spasms.  Dispense: 30 tablet; Refill: 1  Acute pain of left shoulder  Muscle tightness -     Cyclobenzaprine HCl; Take 1 tablet (5 mg total) by mouth 3 (three) times daily as needed for muscle spasms.  Dispense: 30 tablet; Refill: 1  We discussed that the pain in her left groin area is likely due to a strain or sprain of the abductor muscle given her presentation.  Recommended conservative therapy including gradual stretching, Tylenol/ibuprofen, heat and ice.  She will come back in 6 to 8 weeks for follow-up.  We discussed that the shoulder pain involves the nerves if it is causing some tingling.  We discussed getting some imaging done and sending her to orthopedics for further evaluation, but at this time she would like to keep an eye on it.  We will discuss it  further/reassess at her follow-up appointment to see if an orthopedics referral with x-rays is warranted at that time.  Patient verbalized understanding and is agreeable with this plan.  Return in about 6 weeks (around 11/01/2022) for Follow up.  Velva Harman, PA

## 2022-09-26 ENCOUNTER — Ambulatory Visit: Payer: BC Managed Care – PPO | Admitting: Adult Health

## 2022-09-26 DIAGNOSIS — R0683 Snoring: Secondary | ICD-10-CM

## 2022-09-26 DIAGNOSIS — R400 Somnolence: Secondary | ICD-10-CM

## 2022-09-26 DIAGNOSIS — G43709 Chronic migraine without aura, not intractable, without status migrainosus: Secondary | ICD-10-CM | POA: Diagnosis not present

## 2022-09-26 MED ORDER — ONABOTULINUMTOXINA 200 UNITS IJ SOLR
155.0000 [IU] | Freq: Once | INTRAMUSCULAR | Status: AC
  Administered 2022-09-26: 155 [IU] via INTRAMUSCULAR

## 2022-09-26 NOTE — Progress Notes (Signed)
09/26/22:having daily headaches for the 2.5 months. Can function but has a headache daily. Usually wakes up with the headache. Not sure how much nurtec works. Hasn't taken maxalt unless its a severe headache. Headache located across the forehead and behind the right ear.   Patients reports that she has never had a sleep test. She does snore. Feels tired throughout the day.  Tried and failed medication: Nortriptyline, Trokendi, sertraline, Ajovy, tizanidine, Nurtec.   06/29/22: Botox continues to work well.   03/20/22: Migraines still under good control.   12/26/21: Botox working well. Didn't really have many headaches until she was getting due for botox.  09/21/21: Headaches have worsened. Daily headaches on the pain scale its 3-4. Takes nurtec 75 mg every other day.  She does report that she has allergies.  Reports that when she would palpate above her eyes it was sore over the last week.  She is congested today.  But states that she is not like this every day.  She does not take any daily medication for allergies.  Patient was encouraged to follow-up with her PCP as her sinuses could be contributing to daily headaches  06/29/21: Botox is helping. Hx of pituitary adenoma.  We will recheck prolactin and TSH today.  We will repeat MRI of the brain.  BOTOX PROCEDURE NOTE FOR MIGRAINE HEADACHE    Contraindications and precautions discussed with patient(above). Aseptic procedure was observed and patient tolerated procedure. Procedure performed by Ward Givens, NP  The condition has existed for more than 6 months, and pt does not have a diagnosis of ALS, Myasthenia Gravis or Lambert-Eaton Syndrome.  Risks and benefits of injections discussed and pt agrees to proceed with the procedure.  Written consent obtained   Indication/Diagnosis: chronic migraine BOTOX(J0585) injection was performed according to protocol by [employer]. 200 units of BOTOX was dissolved into 4 cc NS.   NDC:  [id number]   Botox- 200 units x 1 vial Lot: [id number] Expiration: 12/2024 NDC: [id number]   Bacteriostatic 0.9% Sodium Chloride- 4 mL total Lot: [id number] Expiration: 11/25 NDC: [id number]   Dx: [id number]  Description of procedure:  The patient was placed in a sitting position. The standard protocol was used for Botox as follows, with 5 units of Botox injected at each site:   -Procerus muscle, midline injection  -Corrugator muscle, bilateral injection  -Frontalis muscle, bilateral injection, with 2 sites each side, medial injection was performed in the upper one third of the frontalis muscle, in the region vertical from the medial inferior edge of the superior orbital rim. The lateral injection was again in the upper one third of the forehead vertically above the lateral limbus of the cornea, 1.5 cm lateral to the medial injection site.  -Temporalis muscle injection, 4 sites, bilaterally. The first injection was 3 cm above the tragus of the ear, second injection site was 1.5 cm to 3 cm up from the first injection site in line with the tragus of the ear. The third injection site was 1.5-3 cm forward between the first 2 injection sites. The fourth injection site was 1.5 cm posterior to the second injection site.  -Occipitalis muscle injection, 3 sites, bilaterally. The first injection was done one half way between the occipital protuberance and the tip of the mastoid process behind the ear. The second injection site was done lateral and superior to the first, 1 fingerbreadth from the first injection. The third injection site was 1 fingerbreadth superiorly and medially from the first injection site.  -  Cervical paraspinal muscle injection, 2 sites, bilateral knee first injection site was 1 cm from the midline of the cervical spine, 3 cm inferior to the lower border of the occipital protuberance. The second injection site was 1.5 cm superiorly and laterally to the first injection  site.  -Trapezius muscle injection was performed at 3 sites, bilaterally. The first injection site was in the upper trapezius muscle halfway between the inflection point of the neck, and the acromion. The second injection site was one half way between the acromion and the first injection site. The third injection was done between the first injection site and the inflection point of the neck.   Will return for repeat injection in 3 months.   A 200 unit sof Botox was used, 155 units were injected, the rest of the Botox was wasted. The patient tolerated the procedure well, there were no complications of the above procedure.  Ward Givens, MSN, NP-C 09/26/2022, 7:55 AM [facility] [address], [place], [place] [address] [phone]

## 2022-09-26 NOTE — Progress Notes (Signed)
Botox- 200 units x 1 vial Lot: [id number] Expiration: 12/2024 NDC: [id number]  Bacteriostatic 0.9% Sodium Chloride- 4 mL total Lot: [id number] Expiration: 11/25 NDC: [id number]  Dx: [id number] B/B Witnessed by S. [facility] CMA

## 2022-10-09 ENCOUNTER — Ambulatory Visit (INDEPENDENT_AMBULATORY_CARE_PROVIDER_SITE_OTHER): Payer: BC Managed Care – PPO | Admitting: Neurology

## 2022-10-09 ENCOUNTER — Encounter: Payer: Self-pay | Admitting: Family Medicine

## 2022-10-09 ENCOUNTER — Encounter: Payer: Self-pay | Admitting: Neurology

## 2022-10-09 VITALS — BP 135/81 | HR 71 | Ht 66.0 in | Wt 214.0 lb

## 2022-10-09 DIAGNOSIS — R0683 Snoring: Secondary | ICD-10-CM | POA: Diagnosis not present

## 2022-10-09 DIAGNOSIS — Z6834 Body mass index (BMI) 34.0-34.9, adult: Secondary | ICD-10-CM

## 2022-10-09 DIAGNOSIS — E6609 Other obesity due to excess calories: Secondary | ICD-10-CM

## 2022-10-09 DIAGNOSIS — G478 Other sleep disorders: Secondary | ICD-10-CM | POA: Insufficient documentation

## 2022-10-09 NOTE — Patient Instructions (Signed)
Quality Sleep Information, Adult Quality sleep is important for your mental and physical health. It also improves your quality of life. Quality sleep means you: Are asleep for most of the time you are in bed. Fall asleep within 30 minutes. Wake up no more than once a night. Are awake for no longer than 20 minutes if you do wake up during the night. Most adults need 7-8 hours of quality sleep each night. How can poor sleep affect me? If you do not get enough quality sleep, you may have: Mood swings. Daytime sleepiness. Decreased alertness, reaction time, and concentration. Sleep disorders, such as insomnia and sleep apnea. Difficulty with: Solving problems. Coping with stress. Paying attention. These issues may affect your performance and productivity at work, school, and home. Lack of sleep may also put you at higher risk for accidents, suicide, and risky behaviors. If you do not get quality sleep, you may also be at higher risk for several health problems, including: Infections. Type 2 diabetes. Heart disease. High blood pressure. Obesity. Worsening of long-term conditions, like arthritis, kidney disease, depression, Parkinson's disease, and epilepsy. What actions can I take to get more quality sleep? Sleep schedule and routine Stick to a sleep schedule. Go to sleep and wake up at about the same time each day. Do not try to sleep less on weekdays and make up for lost sleep on weekends. This does not work. Limit naps during the day to 30 minutes or less. Do not take naps in the late afternoon. Make time to relax before bed. Reading, listening to music, or taking a hot bath promotes quality sleep. Make your bedroom a place that promotes quality sleep. Keep your bedroom dark, quiet, and at a comfortable room temperature. Make sure your bed is comfortable. Avoid using electronic devices that give off bright blue light for 30 minutes before bedtime. Your brain perceives bright blue light  as sunlight. This includes television, phones, and computers. If you are lying awake in bed for longer than 20 minutes, get up and do a relaxing activity until you feel sleepy. Lifestyle     Try to get at least 30 minutes of exercise on most days. Do not exercise 2-3 hours before going to bed. Do not use any products that contain nicotine or tobacco. These products include cigarettes, chewing tobacco, and vaping devices, such as e-cigarettes. If you need help quitting, ask your health care provider. Do not drink caffeinated beverages for at least 8 hours before going to bed. Coffee, tea, and some sodas contain caffeine. Do not drink alcohol or eat large meals close to bedtime. Try to get at least 30 minutes of sunlight every day. Morning sunlight is best. Medical concerns Work with your health care provider to treat medical conditions that may affect sleeping, such as: Nasal obstruction. Snoring. Sleep apnea and other sleep disorders. Talk to your health care provider if you think any of your prescription medicines may cause you to have difficulty falling or staying asleep. If you have sleep problems, talk with a sleep consultant. If you think you have a sleep disorder, talk with your health care provider about getting evaluated by a specialist. Where to find more information Sleep Foundation: sleepfoundation.org American Academy of Sleep Medicine: aasm.org Centers for Disease Control and Prevention (CDC): cdc.gov Contact a health care provider if: You have trouble getting to sleep or staying asleep. You often wake up very early in the morning and cannot get back to sleep. You have daytime sleepiness. You   have daytime sleep attacks of suddenly falling asleep and sudden muscle weakness (narcolepsy). You have a tingling sensation in your legs with a strong urge to move your legs (restless legs syndrome). You stop breathing briefly during sleep (sleep apnea). You think you have a sleep  disorder or are taking a medicine that is affecting your quality of sleep. Summary Most adults need 7-8 hours of quality sleep each night. Getting enough quality sleep is important for your mental and physical health. Make your bedroom a place that promotes quality sleep, and avoid things that may cause you to have poor sleep, such as alcohol, caffeine, smoking, or large meals. Talk to your health care provider if you have trouble falling asleep or staying asleep. This information is not intended to replace advice given to you by your health care provider. Make sure you discuss any questions you have with your health care provider. Document Revised: 10/19/2021 Document Reviewed: 10/19/2021 Elsevier Patient Education  2023 Elsevier Inc. Screening for Sleep Apnea  Sleep apnea is a condition in which breathing pauses or becomes shallow during sleep. Sleep apnea screening is a test to determine if you are at risk for sleep apnea. The test includes a series of questions. It will only takes a few minutes. Your health care provider may ask you to have this test in preparation for surgery or as part of a physical exam. What are the symptoms of sleep apnea? Common symptoms of sleep apnea include: Snoring. Waking up often at night. Daytime sleepiness. Pauses in breathing. Choking or gasping during sleep. Irritability. Forgetfulness. Trouble thinking clearly. Depression. Personality changes. Most people with sleep apnea do not know that they have it. What are the advantages of sleep apnea screening? Getting screened for sleep apnea can help: Ensure your safety. It is important for your health care providers to know whether or not you have sleep apnea, especially if you are having surgery or have other long-term (chronic) health conditions. Improve your health and allow you to get a better night's rest. Restful sleep can help you: Have more energy. Lose weight. Improve high blood  pressure. Improve diabetes management. Prevent stroke. Prevent car accidents. What happens during the screening? Screening usually includes being asked a list of questions about your sleep quality. Some questions you may be asked include: Do you snore? Is your sleep restless? Do you have daytime sleepiness? Has a partner or spouse told you that you stop breathing during sleep? Have you had trouble concentrating or memory loss? What is your age? What is your neck circumference? To measure your neck, keep your back straight and gently wrap the tape measure around your neck. Put the tape measure at the middle of your neck, between your chin and collarbone. What is your sex assigned at birth? Do you have or are you being treated for high blood pressure? If your screening test is positive, you are at risk for the condition. Further testing may be needed to confirm a diagnosis of sleep apnea. Where to find more information You can find screening tools online or at your health care clinic. For more information about sleep apnea screening and healthy sleep, visit these websites: Centers for Disease Control and Prevention: www.cdc.gov American Sleep Apnea Association: www.sleepapnea.org Contact a health care provider if: You think that you may have sleep apnea. Summary Sleep apnea screening can help determine if you are at risk for sleep apnea. It is important for your health care providers to know whether or not you have   sleep apnea, especially if you are having surgery or have other chronic health conditions. You may be asked to take a screening test for sleep apnea in preparation for surgery or as part of a physical exam. This information is not intended to replace advice given to you by your health care provider. Make sure you discuss any questions you have with your health care provider. Document Revised: 06/04/2020 Document Reviewed: 06/04/2020 Elsevier Patient Education  2023 Elsevier  Inc.  

## 2022-10-09 NOTE — Progress Notes (Signed)
SLEEP MEDICINE CLINIC    Provider:  Larey Seat, MD  Primary Care Physician:  Pamela Lin, [place] [address]     Referring Provider: Dr. Sarina Ill, MD     Pamela Lin, [address] [place],  [place] [address]          Chief Complaint according to patient   Patient presents with:     New Patient (Initial Visit)           HISTORY OF PRESENT ILLNESS:  Pamela Lin is a 52 y.o. female patient who is seen upon referral on 10/09/2022 from Pamela Lin  for a .  Chief concern according to patient :  I had migraines all my life : reports she has been having increase in headache in the last few months. She doesn't have any issues falling asleep or staying asleep and does sleep. She is not aware of apneas. She snores, she gained weight over the last 4 years since menopause, BMI is now 62- higher risk of apnea".     I have the pleasure of seeing Pamela Lin 10/09/22 a right -handed female with a possible sleep disorder.   She is followed by dr Pamela Lin / Pamela Givens, NP _ 06/29/22: Botox continues to work well.    03/20/22: Migraines still under good control.    12/26/21: Botox working well. Didn't really have many headaches until she was getting due for botox.   09/21/21: Headaches have worsened. Daily headaches on the pain scale its 3-4. Takes nurtec 75 mg every other day.  She does report that she has allergies.  Reports that when she would palpate above her eyes it was sore over the last week.  She is congested today.  But states that she is not like this every day.  She does not take any daily medication for allergies.  Patient was encouraged to follow-up with her PCP as her sinuses could be contributing to daily headaches   06/29/21: Botox is helping. Hx of pituitary adenoma.  We will recheck prolactin and TSH today.  We will repeat MRI of the brain.     Sleep relevant medical history: Nocturia  1-2 at night/ childhood  Sleep walking, adenoidectomy, no contusion, but one concussion-    Family medical /sleep history: No other family member on CPAP with OSA.    Social history:  Patient is working as a Pharmacist, hospital  and lives in a household with spouse and 2 dogs-  Tobacco use-none .  ETOH use : 2 a month ,  Caffeine intake in form of Coffee( 1 cup in AM ) Soda( / ) Tea ( /) or energy drinks Exercise in form of dog walking.     Sleep habits are as follows: The patient's dinner time is between 6-7 PM.  The bedroom cool, quiet and dark.  The patient goes to bed at 9-9.30 PM after reading in bed she  continues to sleep for 5-6 hours, wakes for one bathroom break, the first time at 3 AM.  Goes up  to work at 6 AM. The preferred sleep position is lateral;ly or prone. , with the support of 1 pillow.  Dreams are reportedly frequent/pleasant - vivid.  The patient wakes up spontaneously/ before 6 AM , with a back up alarm. 6  AM is the usual rise time. She reports not feeling refreshed or restored in AM, with symptoms such as dry  mouth, morning headaches, and residual fatigue.  Naps are taken infrequently, she avoids naps.     Review of Systems: Out of a complete 14 system review, the patient complains of only the following symptoms, and all other reviewed systems are negative.:  Fatigue, sleepiness , snoring, waking up in supine.  Morning headaches.  No cluster headaches.    How likely are you to doze in the following situations: 0 = not likely, 1 = slight chance, 2 = moderate chance, 3 = high chance   Sitting and Reading? Watching Television? Sitting inactive in a public place (theater or meeting)? As a passenger in a car for an hour without a break? Lying down in the afternoon when circumstances permit? Sitting and talking to someone? Sitting quietly after lunch without alcohol? In a car, while stopped for a few minutes in traffic?   Total = 5/ 24 points   FSS endorsed at 25/ 63 points.    Social History   Socioeconomic History   Marital status: Married    Spouse name: Pamela Lin   Number of children: 0   Years of education: Not on file   Highest education level: Master's degree (e.g., MA, MS, MEng, MEd, MSW, MBA)  Occupational History    Employer: [employer]  Tobacco Use   Smoking status: Never   Smokeless tobacco: Never   Tobacco comments:    tried a few cigarettes in college   Vaping Use   Vaping Use: Never used  Substance and Sexual Activity   Alcohol use: Yes    Alcohol/week: 1.0 standard drink of alcohol    Types: 1 Standard drinks or equivalent per week    Comment: socially   Drug use: Never   Sexual activity: Not Currently    Birth control/protection: None  Other Topics Concern   Not on file  Social History Narrative   Patient is right-handed. She lives with her husband in a one level home. Stopped drinking caffeine August 2020. For exercise she walks 4-5 times per week.    Social Determinants of Health   Financial Resource Strain: Not on file  Food Insecurity: Not on file  Transportation Needs: Not on file  Physical Activity: Not on file  Stress: Not on file  Social Connections: Not on file    Family History  Problem Relation Age of Onset   Cancer Mother        breast   Breast cancer Mother    Headache Mother        had them for a long time, less around 18-70 years old    Prostate cancer Father    CAD Father        bypass surgery   Headache Father    Cancer Maternal Aunt        breast   Breast cancer Maternal Aunt    Cancer Paternal Aunt        breast   Breast cancer Paternal Aunt    Migraines Brother     Past Medical History:  Diagnosis Date   Allergy    Migraine     Past Surgical History:  Procedure Laterality Date   ADENOIDECTOMY     CHOLECYSTECTOMY  2009     Current Outpatient Medications on File Prior to Visit  Medication Sig Dispense Refill   atorvastatin (LIPITOR) 10 MG tablet TAKE 1 TABLET BY MOUTH  TWICE PER WEEK. 24 tablet 1   azelastine (ASTELIN) 0.1 % nasal spray Place 2 sprays into both nostrils  as needed for rhinitis. Use in each nostril as directed 30 mL 3   Botulinum Toxin Type A (BOTOX) 200 units SOLR Provider to inject 155 units into the muscles of the head and neck every 12 weeks. Discard remainder. 1 each 3   cyclobenzaprine (FLEXERIL) 5 MG tablet Take 1 tablet (5 mg total) by mouth 3 (three) times daily as needed for muscle spasms. 30 tablet 1   fluticasone (FLONASE) 50 MCG/ACT nasal spray Place 2 sprays into both nostrils as needed for allergies or rhinitis. 16 g 3   Rimegepant Sulfate (NURTEC) 75 MG TBDP Take 1 tablet (75 mg total) by mouth every other day. 16 tablet 5   rizatriptan (MAXALT-MLT) 10 MG disintegrating tablet Take 1 tablet (10 mg total) by mouth as needed for migraine. May repeat in 2 hours if needed. Do not exceed 2 tablets in 24 hours. 9 tablet 11   No current facility-administered medications on file prior to visit.    Allergies  Allergen Reactions   Dust Mite Extract    Grass Extracts [Gramineae Pollens]    Other     Weed pollen, mold     DIAGNOSTIC DATA (LABS, IMAGING, TESTING) - I reviewed patient records, labs, notes, testing and imaging myself where available.  Lab Results  Component Value Date   WBC 4.6 01/25/2022   HGB 13.7 01/25/2022   HCT 40.8 01/25/2022   MCV 89 01/25/2022   PLT 179 01/25/2022      Component Value Date/Time   NA 143 05/31/2022 0845   K 4.7 05/31/2022 0845   CL 106 05/31/2022 0845   CO2 25 05/31/2022 0845   GLUCOSE 99 05/31/2022 0845   GLUCOSE 101 (H) 05/06/2008 0650   BUN 10 05/31/2022 0845   CREATININE 0.71 05/31/2022 0845   CALCIUM 9.4 05/31/2022 0845   PROT 6.8 05/31/2022 0845   ALBUMIN 4.4 05/31/2022 0845   AST 15 05/31/2022 0845   ALT 16 05/31/2022 0845   ALKPHOS 69 05/31/2022 0845   BILITOT 0.5 05/31/2022 0845   GFRNONAA 103 04/28/2020 0903   GFRAA 119 04/28/2020 0903   Lab Results  Component  Value Date   CHOL 182 05/31/2022   HDL 65 05/31/2022   LDLCALC 101 (H) 05/31/2022   TRIG 87 05/31/2022   CHOLHDL 2.8 05/31/2022   Lab Results  Component Value Date   HGBA1C 5.4 01/25/2022   Lab Results  Component Value Date   VITAMINB12 354 12/24/2019   Lab Results  Component Value Date   TSH 1.650 01/25/2022    PHYSICAL EXAM:  Today's Vitals   10/09/22 1416  BP: 135/81  Pulse: 71  Weight: 214 lb (97.1 kg)  Height: 5\' 6"  (1.676 m)   Body mass index is 34.54 kg/m.   Wt Readings from Last 3 Encounters:  10/09/22 214 lb (97.1 kg)  09/20/22 211 lb (95.7 kg)  01/31/22 211 lb 12.8 oz (96.1 kg)     Ht Readings from Last 3 Encounters:  10/09/22 5\' 6"  (1.676 m)  09/20/22 5\' 4"  (1.626 m)  01/31/22 5' 4.96" (1.65 m)      General: The patient is awake, alert and appears not in acute distress. The patient is well groomed. Head: Normocephalic, atraumatic. Neck is supple.  Mallampati 2,  neck circumference:15 inches .  Nasal airflow  patent.  Retrognathia is not seen.  Dental status: smaller lower jaw, more crowded.   Cardiovascular:  Regular rate and cardiac rhythm by pulse,   without distended neck veins. Respiratory:  Lungs are clear to auscultation.  Skin:  Without evidence of ankle edema, or rash. Trunk: The patient's posture is erect.   NEUROLOGIC EXAM: The patient is awake and alert, oriented to place and time.   Memory subjective described as intact.  Attention span & concentration ability appears normal.  Speech is fluent, without dysarthria, dysphonia or aphasia.  Mood and affect are appropriate.   Cranial nerves: no loss of smell or taste reported  Pupils are equal and briskly reactive to light. Funduscopic exam deferred.  Extraocular movements in vertical and horizontal planes were intact and without nystagmus.  No Diplopia. Visual fields by finger perimetry are intact. Hearing was intact to soft voice and finger rubbing.    Facial sensation intact to  fine touch.  Facial motor strength is symmetric and tongue and uvula move midline.  Neck ROM : rotation, tilt and flexion extension were normal for age and shoulder shrug was symmetrical.    Motor exam:  Symmetric bulk, tone and ROM.   Normal tone without cog wheeling, symmetric grip strength .   Sensory:  Fine touch  and vibration were normal.  Proprioception tested in the upper extremities was normal.   Coordination: Rapid alternating movements in the fingers/hands were of normal speed.  The Finger-to-nose maneuver was intact without evidence of ataxia, dysmetria or tremor.   Gait and station: Patient could rise unassisted from a seated position, walked without assistive device.  Stance is of normal width/ base and the patient turned with 3 steps.  Toe and heel walk were deferred.  Deep tendon reflexes: in the  upper and lower extremities are symmetric and intact.  Babinski response was deferred.     ASSESSMENT AND PLAN 52 y.o. year old female  here with:  NON- restorative sleep.     1)  Headaches in the morning. No arousals through headaches.   2) snoring is present when sleeping supine, waking her up.   3) risk factors for OSA are BMI, neck size and Mallampati.  4) HST for screening of apnea, OSA mostly.     I plan to follow up through NP [place] within 4-5 months.   I would like to thank Pamela Harman, PA and [place], [place], [place],  [place] [address] for allowing me to meet with and to take care of this pleasant patient.   CC: I will share my notes with Dr Pamela Lin and Pamela Givens, NP.  After spending a total time of  35  minutes face to face and additional time for physical and neurologic examination, review of laboratory studies,  personal review of imaging studies, reports and results of other testing and review of referral information / records as far as provided in visit,   Electronically signed by: Larey Seat, MD 10/09/2022  2:53 PM  [facility] Board certified by The [employer] of Sleep Medicine and Diplomate of the [employer] of Sleep Medicine. Board certified In Neurology through the [place], Fellow of the [employer] of Neurology. Medical Director of [employer].

## 2022-10-11 ENCOUNTER — Encounter: Payer: Self-pay | Admitting: Adult Health

## 2022-10-11 DIAGNOSIS — R400 Somnolence: Secondary | ICD-10-CM

## 2022-10-11 DIAGNOSIS — G43709 Chronic migraine without aura, not intractable, without status migrainosus: Secondary | ICD-10-CM

## 2022-10-11 DIAGNOSIS — G43719 Chronic migraine without aura, intractable, without status migrainosus: Secondary | ICD-10-CM

## 2022-10-11 DIAGNOSIS — R0683 Snoring: Secondary | ICD-10-CM

## 2022-10-11 DIAGNOSIS — G478 Other sleep disorders: Secondary | ICD-10-CM

## 2022-10-19 NOTE — Telephone Encounter (Signed)
Order has been placed for HST based off the previous office visit with Dr Vickey Huger and her recommendation.

## 2022-10-19 NOTE — Addendum Note (Signed)
Addended by: Judi Cong on: 10/19/2022 03:06 PM   Modules accepted: Orders

## 2022-10-26 ENCOUNTER — Telehealth: Payer: Self-pay | Admitting: Neurology

## 2022-10-26 NOTE — Telephone Encounter (Signed)
10/25/22 left VM [place] 10/19/22 BCBS state no auth req EE

## 2022-10-31 ENCOUNTER — Institutional Professional Consult (permissible substitution): Payer: BC Managed Care – PPO | Admitting: Neurology

## 2022-11-01 ENCOUNTER — Ambulatory Visit: Payer: BC Managed Care – PPO | Admitting: Neurology

## 2022-11-01 DIAGNOSIS — G478 Other sleep disorders: Secondary | ICD-10-CM

## 2022-11-01 DIAGNOSIS — G43719 Chronic migraine without aura, intractable, without status migrainosus: Secondary | ICD-10-CM

## 2022-11-01 DIAGNOSIS — G4733 Obstructive sleep apnea (adult) (pediatric): Secondary | ICD-10-CM

## 2022-11-01 DIAGNOSIS — G43709 Chronic migraine without aura, not intractable, without status migrainosus: Secondary | ICD-10-CM

## 2022-11-01 DIAGNOSIS — R0683 Snoring: Secondary | ICD-10-CM

## 2022-11-01 DIAGNOSIS — R400 Somnolence: Secondary | ICD-10-CM

## 2022-11-02 ENCOUNTER — Ambulatory Visit: Payer: BC Managed Care – PPO | Admitting: Family Medicine

## 2022-11-06 ENCOUNTER — Ambulatory Visit: Payer: BC Managed Care – PPO | Admitting: Neurology

## 2022-11-07 NOTE — Progress Notes (Signed)
[facility]  Pamela Lin    HOME SLEEP TEST REPORT ( mail out testing by Watch [facility])   STUDY DATA: 11-07-2022  DOB:   Female, 52 y.o., [birth year] MRN: [id number]    ORDERING CLINICIAN:  REFERRING CLINICIAN: Naomie Dean, MD via Butch Penny, NP    CLINICAL INFORMATION/HISTORY: 10-09-2022: Chief complaint of non restorative sleep.  This patient of Dr Trevor Mace reported an increase in headaches in the last few months. She doesn't have any issues falling asleep or staying asleep. She is not aware of apneas. She snores since she gained weight over the last 4 years , "likely since menopause, BMI is now 42- and she at a higher risk of apnea".   Epworth sleepiness score: 5 /24. FSS at 25/ 63  points.    BMI: 43 kg/m   Neck Circumference: 15"   FINDINGS:   Sleep Summary:   Total Recording Time (hours, min):     8 hours 41 minutes   Total Sleep Time (hours, min): 7 hours 57 minutes                Percent REM (%):     33.7%    Sleep latency was 5 minutes and REM sleep latency 99 minutes long.                                 Respiratory Indices (AASM) :   Calculated pAHI (per hour):    2.9/h                         REM pAHI: 6.4/h                                               NREM pAHI: 1.1/h                            Supine AHI: This patient actually slept mostly in prone position which was associated with an AHI of 3.5/h and RDI of 6.3/h.  This was followed by supine sleep with an AHI of 1.6/h.  Snoring level reached a mean volume of 40 dB which is mild to moderate and only present for 6.7% of total sleep time.                                                 Oxygen Saturation Statistics:           O2 Saturation Range (%):      Between a nadir at 87 and a maximum of 99% saturation with a mean saturation at 95%.                                 O2 Saturation (minutes) <89%:   0 minutes        Pulse Rate Statistics:   Pulse Mean (bpm):    68 bpm              Pulse Range: Between 51 and 97 bpm  IMPRESSION:  This HST confirms that sleep apnea is not present.   RECOMMENDATION: No intervention is needed.    INTERPRETING PHYSICIAN:   Melvyn Novas, MD   [facility].

## 2022-11-08 ENCOUNTER — Encounter: Payer: Self-pay | Admitting: Family Medicine

## 2022-11-08 ENCOUNTER — Ambulatory Visit: Payer: BC Managed Care – PPO | Admitting: Family Medicine

## 2022-11-08 VITALS — BP 118/81 | HR 70 | Resp 18 | Ht 66.0 in | Wt 213.0 lb

## 2022-11-08 DIAGNOSIS — M25512 Pain in left shoulder: Secondary | ICD-10-CM

## 2022-11-08 NOTE — Progress Notes (Signed)
   Established Patient Office Visit  Subjective   Patient ID: Pamela Lin, female    DOB: Sep 15, 1970  Age: 52 y.o. MRN: [id number]  Chief Complaint  Patient presents with   Back Pain    HPI Pamela Lin is a 52 y.o. female presenting today for follow up of left adductor strain and upper left back/shoulder pain.  At last appointment recommended conservative therapy including gradual stretching, Tylenol/ibuprofen, heat and ice.  She has been cautious in using the cyclobenzaprine as it does make her sleepy, but it does help with her muscle pain.  She has also been doing stretches that have been alleviating her abductor pain.  She continues to have pain around her left scapula.  She states that it feels very tight, and she continues to have tingling radiating down her left arm.  Denies numbness or weakness, denies any symptoms on the right side.  ROS Negative unless otherwise noted in HPI   Objective:     BP 118/81 (BP Location: Right Arm, Patient Position: Sitting, Cuff Size: Large)   Pulse 70   Resp 18   Ht 5\' 6"  (1.676 m)   Wt 213 lb (96.6 kg)   SpO2 96%   BMI 34.38 kg/m   Physical Exam Constitutional:      General: She is not in acute distress.    Appearance: Normal appearance.  HENT:     Head: Normocephalic and atraumatic.  Cardiovascular:     Rate and Rhythm: Normal rate and regular rhythm.     Heart sounds: No murmur heard.    No friction rub. No gallop.  Pulmonary:     Effort: Pulmonary effort is normal. No respiratory distress.     Breath sounds: No wheezing, rhonchi or rales.  Musculoskeletal:        General: No swelling, tenderness or deformity. Normal range of motion.     Comments: Full ROM of upper extremities, negative Tinel's sign of the elbow  Skin:    General: Skin is warm and dry.  Neurological:     Mental Status: She is alert and oriented to person, place, and time.     Assessment & Plan:  Acute pain of left shoulder -     DG Shoulder  Left; Future -     Ambulatory referral to Orthopedic Surgery  Given that there continue to be neurological signs, recommend starting workup with x-ray and referral to orthopedics.  Patient is agreeable, understands that we must start with an x-ray and in the future Ortho will be able to then proceed with CT or MRI to look at soft tissue if necessary.  Return in about 3 months (around 02/08/2023) for annual physical, fasting blood work 1 week before.    Melida Quitter, PA

## 2022-11-12 NOTE — Procedures (Signed)
     [facility]  Kiora W. Zakrzewski    HOME SLEEP TEST REPORT ( mail out testing by Watch PAT)   STUDY DATA: 11-07-2022  DOB:   Female, 52 y.o., 11/20/1970 MRN: [id number]    ORDERING CLINICIAN:  REFERRING CLINICIAN: Antonia Ahern, MD via Megan [place], NP    CLINICAL INFORMATION/HISTORY: 10-09-2022: Chief complaint of non restorative sleep.  This patient of Dr Ahern's reported an increase in headaches in the last few months. She doesn't have any issues falling asleep or staying asleep. She is not aware of apneas. She snores since she gained weight over the last 4 years , "likely since menopause, BMI is now 43- and she at a higher risk of apnea".   Epworth sleepiness score: 5 /24. FSS at 25/ 63  points.    BMI: 43 kg/m   Neck Circumference: 15"   FINDINGS:   Sleep Summary:   Total Recording Time (hours, min):     8 hours 41 minutes   Total Sleep Time (hours, min): 7 hours 57 minutes                Percent REM (%):     33.7%    Sleep latency was 5 minutes and REM sleep latency 99 minutes long.                                 Respiratory Indices (AASM) :   Calculated pAHI (per hour):    2.9/h                         REM pAHI: 6.4/h                                               NREM pAHI: 1.1/h                            Supine AHI: This patient actually slept mostly in prone position which was associated with an AHI of 3.5/h and RDI of 6.3/h.  This was followed by supine sleep with an AHI of 1.6/h.  Snoring level reached a mean volume of 40 dB which is mild to moderate and only present for 6.7% of total sleep time.                                                 Oxygen Saturation Statistics:           O2 Saturation Range (%):      Between a nadir at 87 and a maximum of 99% saturation with a mean saturation at 95%.                                 O2 Saturation (minutes) <89%:   0 minutes        Pulse Rate Statistics:   Pulse Mean (bpm):    68 bpm              Pulse Range: Between 51 and 97 bpm                  IMPRESSION:  This HST confirms that sleep apnea is not present.   RECOMMENDATION: No intervention is needed.    INTERPRETING PHYSICIAN:   Jaice Digioia, MD   [facility].                          

## 2022-11-13 ENCOUNTER — Telehealth: Payer: Self-pay

## 2022-11-13 NOTE — Telephone Encounter (Signed)
-----   Message from Melvyn Novas, MD sent at 11/12/2022  8:15 PM EDT ----- IMPRESSION:  This HST confirms that sleep apnea is not present.  RECOMMENDATION: No intervention is needed. No follow up in the sleep clinic will be needed. Returning to primary neurologist or NP Ethelene Browns

## 2022-11-13 NOTE — Telephone Encounter (Signed)
I spoke with the patient to inform her of the results of the home sleep test. She verbalized understanding of the findings and expressed appreciation for the call. All questions answered.

## 2022-11-16 ENCOUNTER — Other Ambulatory Visit (INDEPENDENT_AMBULATORY_CARE_PROVIDER_SITE_OTHER): Payer: BC Managed Care – PPO

## 2022-11-16 ENCOUNTER — Encounter: Payer: Self-pay | Admitting: Sports Medicine

## 2022-11-16 ENCOUNTER — Ambulatory Visit: Payer: BC Managed Care – PPO | Admitting: Sports Medicine

## 2022-11-16 DIAGNOSIS — M25512 Pain in left shoulder: Secondary | ICD-10-CM | POA: Diagnosis not present

## 2022-11-16 DIAGNOSIS — M5412 Radiculopathy, cervical region: Secondary | ICD-10-CM

## 2022-11-16 DIAGNOSIS — G8929 Other chronic pain: Secondary | ICD-10-CM | POA: Diagnosis not present

## 2022-11-16 DIAGNOSIS — M899 Disorder of bone, unspecified: Secondary | ICD-10-CM | POA: Diagnosis not present

## 2022-11-16 DIAGNOSIS — M62830 Muscle spasm of back: Secondary | ICD-10-CM | POA: Diagnosis not present

## 2022-11-16 MED ORDER — METHYLPREDNISOLONE 4 MG PO TBPK: ORAL_TABLET | ORAL | 0 refills | Status: DC

## 2022-11-16 MED ORDER — GABAPENTIN 300 MG PO CAPS: 300.0000 mg | ORAL_CAPSULE | Freq: Every day | ORAL | 1 refills | Status: DC

## 2022-11-16 NOTE — Progress Notes (Signed)
Pamela Lin - 52 y.o. female MRN [id number]  Date of birth: [birth year]  Office Visit Note: Visit Date: 11/16/2022 PCP: Melida Quitter, PA Referred by: Melida Quitter, PA  Subjective: Chief Complaint  Patient presents with   Left Shoulder - Pain   HPI: Pamela Lin is a pleasant 52 y.o. female who presents today for left neck/ shoulder and numbness tingling down the arm.  She has had symptoms for a number of months now.  She does have tightness and pain over the posterior shoulder, states she has chronically had tight trapezius muscles and has had massage therapy in the past.  Here over the past few months though she has noticed radiation of pain down the shoulder and numbness and tingling into the hands and fingers.  Most of her tingling sensation will be in the thenar eminence of the thumb and the second and long finger.  She will have a sensation of needing to shake out the hand but this does not improve her symptoms.  She takes very infrequent ibuprofen and Tylenol but tries to avoid as she has had to take this in the past for headaches.  She is right-hand dominant.  Pain will happen throughout the day, no inciting event or certain triggers that she is aware of.  She is not diabetic. Lab Results  Component Value Date   HGBA1C 5.4 01/25/2022   Pertinent ROS were reviewed with the patient and found to be negative unless otherwise specified above in HPI.   Assessment & Plan: Visit Diagnoses:  1. Radiculopathy, cervical region   2. Chronic left shoulder pain   3. Spasm of left trapezius muscle   4. Scapular dysfunction    Plan: Discussed with Gabby the likely etiology of her pain and tingling.  Her symptoms are most suggestive of cervical radiculopathy.  She does have rather significant hypertonicity of the trapezius and left scapular musculature, which could be irritating and putting pressure on her cervical plexus.  Her shoulder exam is benign and provocative  symptoms do not suggest carpal tunnel syndrome.  We discussed all treatment options such as oral medication, formalized physical therapy, advanced imaging.  At this point, we will start a 6-day methylprednisolone taper for anti-inflammatory purposes, following this she will begin gabapentin 300 mg once nightly for nerve blockade purposes.  She will be started in formalized physical therapy, referral sent today, for her neck as well as her left trap/shoulder, recommended soft tissue techniques with scapular stabilization and postural changes, likely dry needling, cupping and other soft tissue modalities at the therapist discretion.  I would like to see her back in 1 month after starting physical therapy.  She may call or return sooner if any issues arise.  If her pain does not improve with the above treatment modalities, next step would likely be getting MRI of the cervical spine; prefer this versus nerve conduction study initially  Follow-up: Return for f/u 76-month after starting physical therapy (5-6 weeks).   Meds & Orders:  Meds ordered this encounter  Medications   methylPREDNISolone (MEDROL DOSEPAK) 4 MG TBPK tablet    Sig: Take per packet instructions. Taper dosing.    Dispense:  1 each    Refill:  0   gabapentin (NEURONTIN) 300 MG capsule    Sig: Take 1 capsule (300 mg total) by mouth at bedtime.    Dispense:  30 capsule    Refill:  1    Orders Placed This Encounter  Procedures  XR Shoulder Left   XR Cervical Spine 2 or 3 views   Ambulatory referral to Physical Therapy     Procedures: No procedures performed      Clinical History: No specialty comments available.  She reports that she has never smoked. She has never been exposed to tobacco smoke. She has never used smokeless tobacco.  Recent Labs    01/25/22 0845  HGBA1C 5.4    Objective:    Physical Exam  Gen: Well-appearing, in no acute distress; non-toxic CV: Well-perfused. Warm.  Resp: Breathing unlabored on  room air; no wheezing. Psych: Fluid speech in conversation; appropriate affect; normal thought process Neuro: Sensation intact throughout. No gross coordination deficits.   Ortho Exam - Cervical: No midline spinous process tenderness.  There is full range of motion with flexion/extension and rotation about the cervical spine.  5/5 strength of upper extremities bilaterally.  Positive Spurling's test on the left.  - Left shoulder/Trap: Full range of motion of the shoulder in all directions.  No bony tenderness to palpation.  There is significant hypertonicity and trigger points within the left trapezius muscle and the medial scapular border.  Mild bilateral shoulder protraction.  There is no scapular winging but if there is scapular dysfunction on the left secondary to her hypertonicity.  Rotator cuff testing intact, 5/5 strength.  Negative shoulder provocative exam.  - Wrist: Negative Tinel's at the carpal tunnel, equivocal Phalen's, negative reverse Phalen's.  Imaging: XR Shoulder Left  Result Date: 11/16/2022 3 views of the left shoulder including Grashey, scapular Y and axial femoral ordered and reviewed by myself.  No significant arthritic change.  Humeral head is well located within the glenohumeral joint.  No acute fracture or otherwise bony abnormality noted.  XR Cervical Spine 2 or 3 views  Result Date: 11/16/2022 2 views of the cervical spine including AP and lateral femoral ordered and reviewed by myself.  No significant scoliosis.  There is mild-moderate degenerative disc disease at the C6/C7 level with loss of intervertebral disc height and mild anterior spurring C6 greater than C7.  Otherwise no acute bony fracture or significant arthropathy.   Past Medical/Family/Surgical/Social History: Medications & Allergies reviewed per EMR, new medications updated. Patient Active Problem List   Diagnosis Date Noted   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   Snoring 10/09/2022   Class 1 obesity due  to excess calories without serious comorbidity with body mass index (BMI) of 34.0 to 34.9 in adult 10/09/2022   Pituitary microadenoma (HCC) 12/24/2019   Elevated LDL cholesterol level 01/29/2019   Chronic migraine without aura, with intractable migraine, so stated, with status migrainosus 01/29/2019   Obesity (BMI 30-39.9) 10/12/2016   History of environmental allergies 10/12/2016   Past Medical History:  Diagnosis Date   Allergy    Migraine    Family History  Problem Relation Age of Onset   Cancer Mother        breast   Breast cancer Mother    Headache Mother        had them for a long time, less around 106-19 years old    Prostate cancer Father    CAD Father        bypass surgery   Headache Father    Cancer Maternal Aunt        breast   Breast cancer Maternal Aunt    Cancer Paternal Aunt        breast   Breast cancer Paternal Aunt    Migraines Brother  Past Surgical History:  Procedure Laterality Date   ADENOIDECTOMY     CHOLECYSTECTOMY  2009   Social History   Occupational History    Employer: [employer]  Tobacco Use   Smoking status: Never    Passive exposure: Never   Smokeless tobacco: Never   Tobacco comments:    tried a few cigarettes in college   Vaping Use   Vaping Use: Never used  Substance and Sexual Activity   Alcohol use: Yes    Alcohol/week: 1.0 standard drink of alcohol    Types: 1 Standard drinks or equivalent per week    Comment: socially   Drug use: Never   Sexual activity: Not Currently    Birth control/protection: None

## 2022-11-16 NOTE — Patient Instructions (Addendum)
Pamela Lin,  Findings suspicious that you are having nerve impingement coming from the cervical spine.  I also think that your tight shoulder and trapezius muscles are contributing.  *Things to do: - Begin Methylprednisolone --> take the taper for 6 days - Once you finish the prednisone taper, you will begin Gabapentin 1 tablet (300mg ) every night about 1 hour before bed - We sent a referral to physical therapy, they will call you to set this up - I would like to see you back in 37-month after beginning physical therapy

## 2022-11-20 NOTE — Telephone Encounter (Signed)
Aundra Millet, what would your next recommendation be? Her next Botox is on 7/1. Should we try to schedule an OV in between if able?

## 2022-11-20 NOTE — Telephone Encounter (Signed)
Yes lets try [place]

## 2022-11-21 ENCOUNTER — Telehealth: Payer: Self-pay | Admitting: Pharmacy Technician

## 2022-11-21 MED ORDER — QULIPTA 30 MG PO TABS: 30.0000 mg | ORAL_TABLET | Freq: Every day | ORAL | 3 refills | Status: DC

## 2022-11-21 NOTE — Addendum Note (Signed)
Addended by: Bertram Savin on: 11/21/2022 08:13 AM   Modules accepted: Orders

## 2022-11-21 NOTE — Telephone Encounter (Signed)
Patient Advocate Encounter   Received notification that prior authorization for Qulipta 30MG  tablets is required.   PA submitted on 11/21/2022 [facility] Insurance [employer] Electronic PA Form Status is pending       Roland Earl, CPhT Pharmacy Patient Advocate Specialist [facility] Patient Advocate Team Direct Number: [phone]  Fax: [phone]

## 2022-11-22 ENCOUNTER — Other Ambulatory Visit: Payer: Self-pay | Admitting: *Deleted

## 2022-11-22 NOTE — Progress Notes (Signed)
Nurtec d/c and [facility] pharmacy has been notified via fax. Received a receipt of confirmation.

## 2022-11-22 NOTE — Telephone Encounter (Signed)
Yes that's fine 

## 2022-11-22 NOTE — Telephone Encounter (Signed)
Patient Advocate Encounter  Prior Authorization for Qulipta 30MG  tablets  has been approved.    PA# [id number] Insurance [employer] Electronic PA Form Effective dates: 11/21/2022 through 02/21/2023

## 2022-12-04 ENCOUNTER — Encounter: Payer: Self-pay | Admitting: Sports Medicine

## 2022-12-11 ENCOUNTER — Other Ambulatory Visit: Payer: Self-pay | Admitting: Sports Medicine

## 2022-12-11 ENCOUNTER — Encounter: Payer: Self-pay | Admitting: Sports Medicine

## 2022-12-11 DIAGNOSIS — G8929 Other chronic pain: Secondary | ICD-10-CM

## 2022-12-14 ENCOUNTER — Ambulatory Visit: Payer: BC Managed Care – PPO

## 2022-12-16 IMAGING — MG MM DIGITAL SCREENING BILAT W/ TOMO AND CAD
8 series · 8 of 24 positions shown · non-contrast
Comparison: Previous exam(s).

CLINICAL DATA: Screening.

EXAM:
DIGITAL SCREENING BILATERAL MAMMOGRAM WITH TOMOSYNTHESIS AND CAD
TECHNIQUE: Bilateral screening digital craniocaudal and mediolateral oblique
mammograms were obtained. Bilateral screening digital breast
tomosynthesis was performed. The images were evaluated with
computer-aided detection.

[L CC synth-2D]
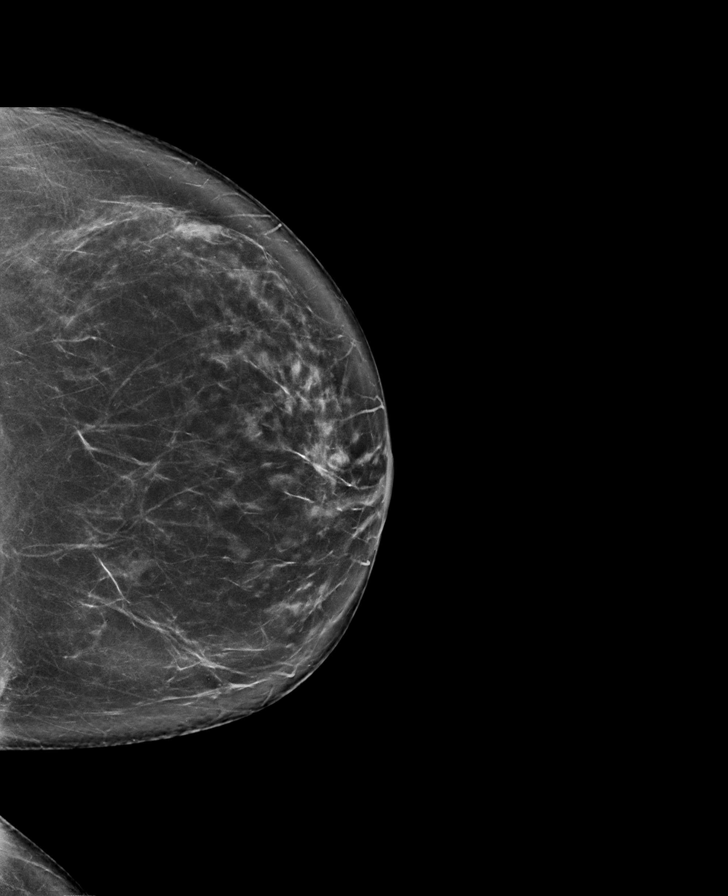

[R CC synth-2D]
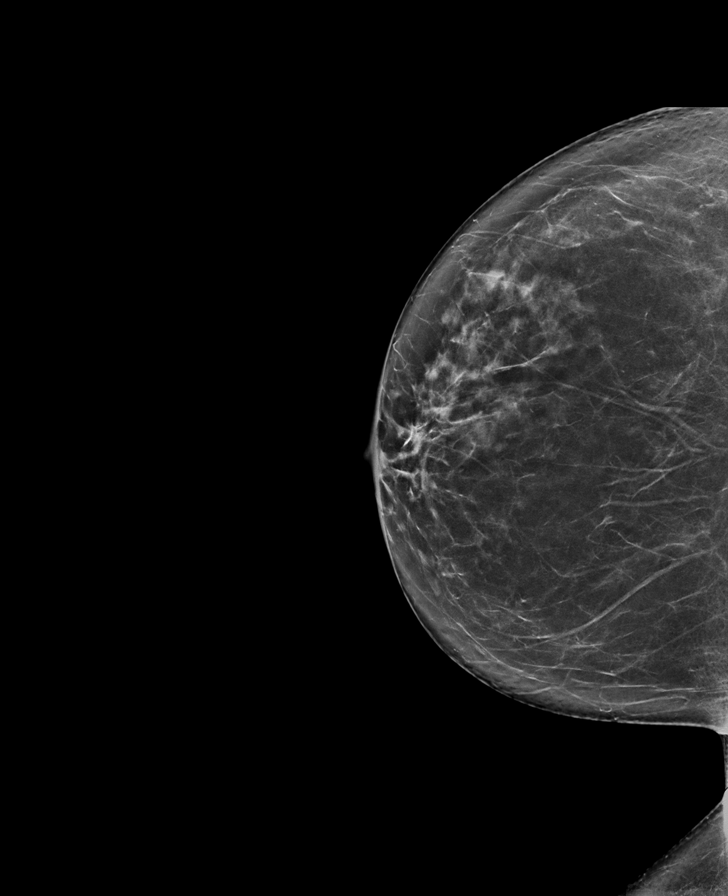

[L MLO synth-2D]
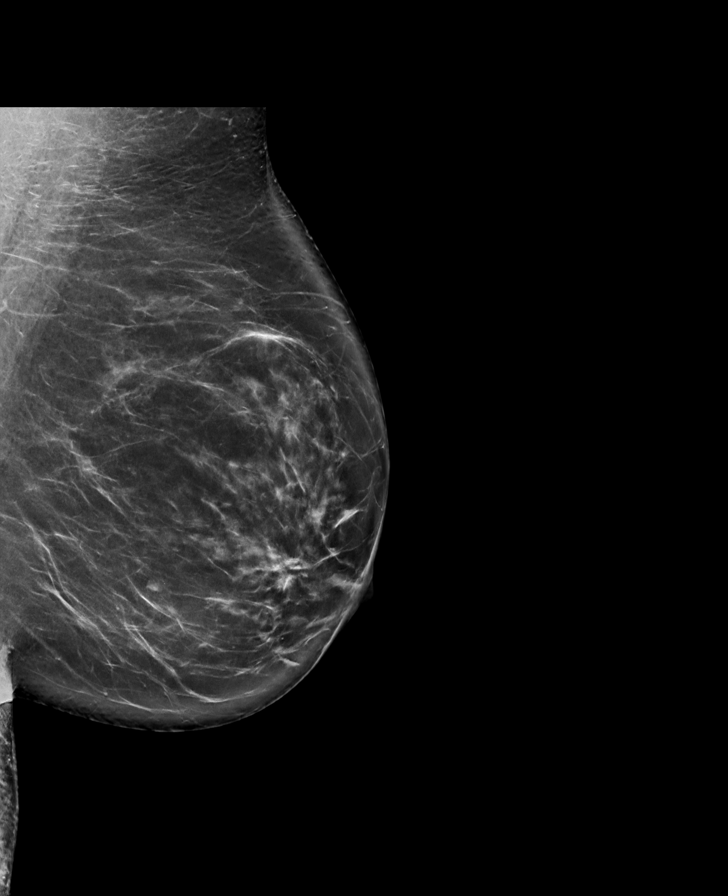

[R MLO synth-2D]
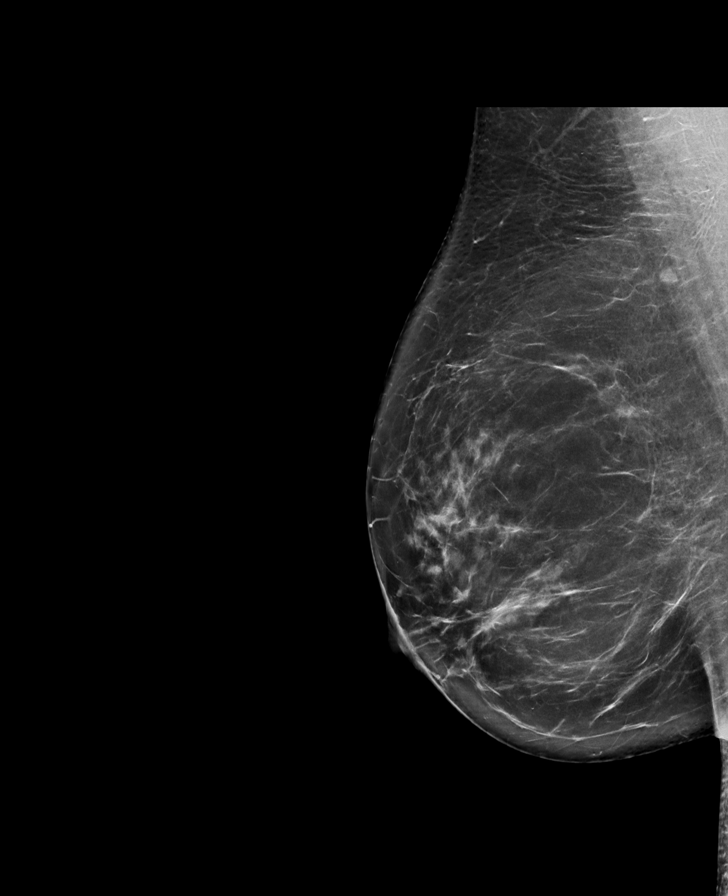

[L CC tomo · tomo slice 41/82.0]
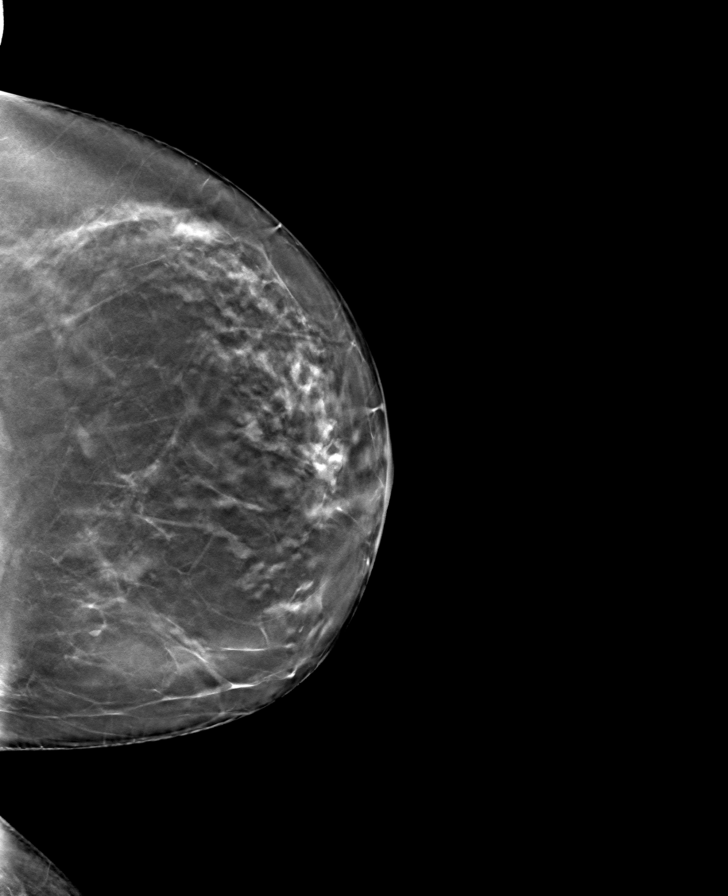

[R CC tomo · tomo slice 40/79.0]
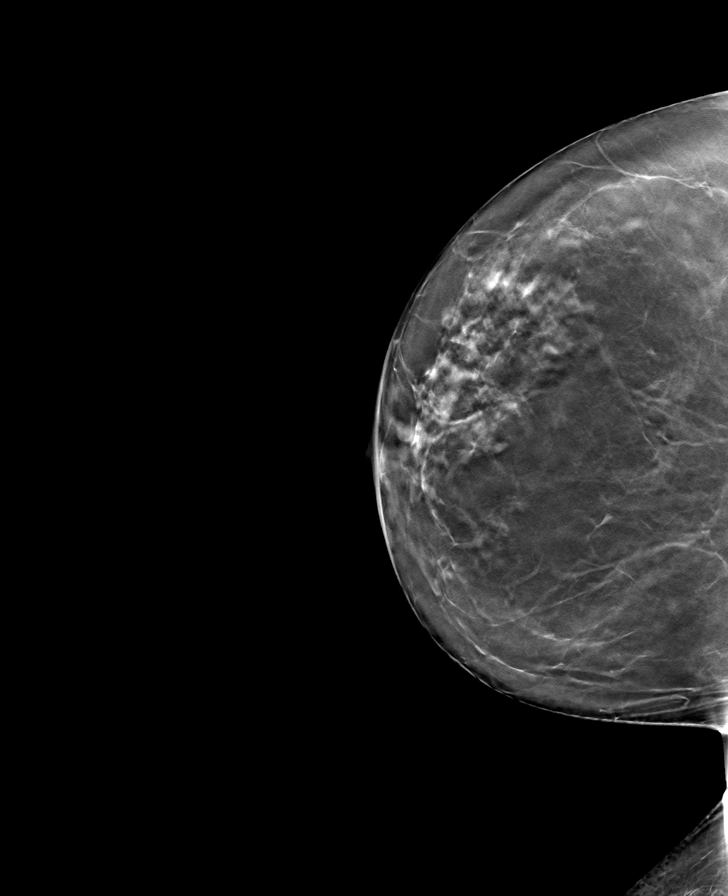

[L MLO tomo · tomo slice 47/94.0]
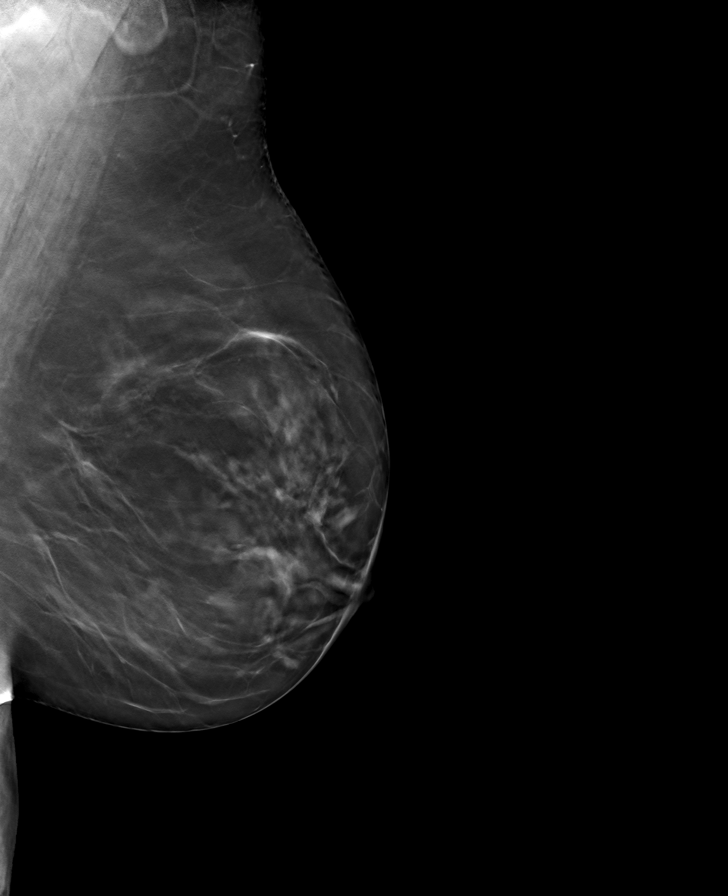

[R MLO tomo · tomo slice 47/93.0]
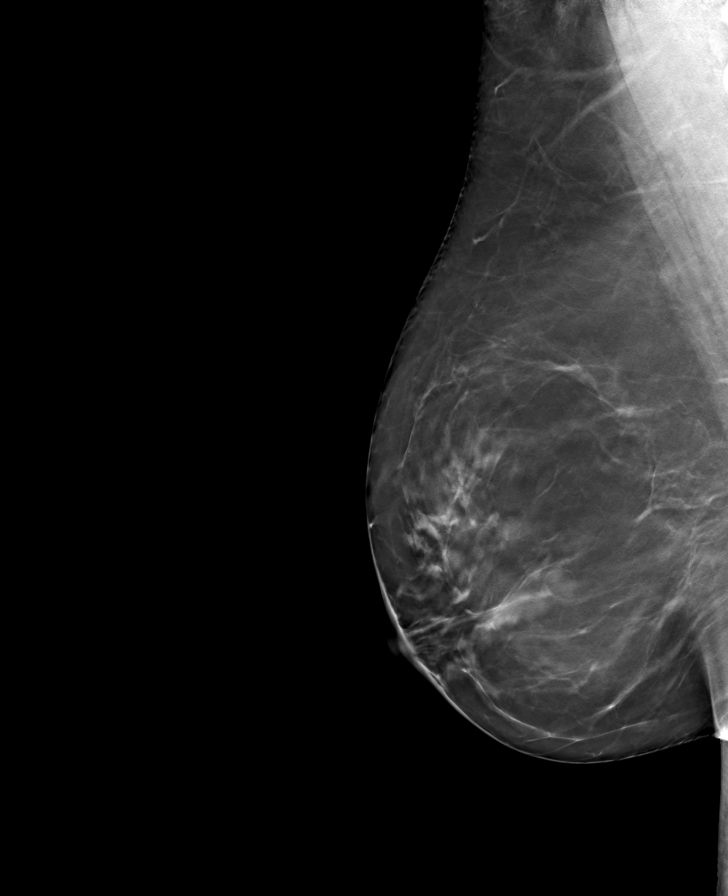

[8 of 24 positions shown; findings below may reference images not displayed]

ACR Breast Density Category b: There are scattered areas of
fibroglandular density.
FINDINGS: There are no findings suspicious for malignancy.
IMPRESSION: No mammographic evidence of malignancy. A result letter of this
screening mammogram will be mailed directly to the patient.

RECOMMENDATION:
Screening mammogram in one year. (Code:51-O-LD2)

BI-RADS CATEGORY  1: Negative.

## 2022-12-18 ENCOUNTER — Other Ambulatory Visit: Payer: Self-pay | Admitting: Sports Medicine

## 2022-12-18 MED ORDER — GABAPENTIN 300 MG PO CAPS: 300.0000 mg | ORAL_CAPSULE | Freq: Every day | ORAL | 1 refills | Status: DC

## 2022-12-19 ENCOUNTER — Telehealth: Payer: Self-pay | Admitting: Adult Health

## 2022-12-19 NOTE — Telephone Encounter (Signed)
[employer] Botox continuation form completed and faxed with OV notes to [phone].

## 2022-12-20 NOTE — Telephone Encounter (Signed)
Pamela Lin on file, case #: [id number] (thru 05/14/23)

## 2022-12-27 ENCOUNTER — Ambulatory Visit: Payer: BC Managed Care – PPO | Admitting: Adult Health

## 2023-01-01 ENCOUNTER — Encounter: Payer: Self-pay | Admitting: Family Medicine

## 2023-01-01 DIAGNOSIS — E7800 Pure hypercholesterolemia, unspecified: Secondary | ICD-10-CM

## 2023-01-02 MED ORDER — ATORVASTATIN CALCIUM 10 MG PO TABS: ORAL_TABLET | ORAL | 1 refills | Status: DC

## 2023-01-08 ENCOUNTER — Ambulatory Visit: Payer: BC Managed Care – PPO | Admitting: Adult Health

## 2023-01-08 DIAGNOSIS — G43709 Chronic migraine without aura, not intractable, without status migrainosus: Secondary | ICD-10-CM | POA: Diagnosis not present

## 2023-01-08 DIAGNOSIS — D352 Benign neoplasm of pituitary gland: Secondary | ICD-10-CM

## 2023-01-08 MED ORDER — ONABOTULINUMTOXINA 200 UNITS IJ SOLR
155.0000 [IU] | Freq: Once | INTRAMUSCULAR | Status: AC
  Administered 2023-01-08: 155 [IU] via INTRAMUSCULAR

## 2023-01-08 MED ORDER — QULIPTA 60 MG PO TABS: 60.0000 mg | ORAL_TABLET | Freq: Every day | ORAL | 5 refills | Status: DC

## 2023-01-08 NOTE — Progress Notes (Signed)
PATIENT: Pamela Lin DOB: [birth year]  REASON FOR VISIT: follow up HISTORY FROM: patient PRIMARY NEUROLOGIST: Dr. Lucia Gaskins   Chief Complaint  Patient presents with   Botulinum Toxin Injection    Pt in 4     HISTORY OF PRESENT ILLNESS: Today 01/08/23  Pamela Lin is a 52 y.o. female who has been followed in this office for migraine headaches. Returns today for follow-up.  She states that she is continue to have a Daily headache typically located across the forehead.  Not taking any abortive medication.  She has remained on Qulipta 30 mg daily.Denies N/V. Denies photophobia and phonophobia. Sleep consult- no apnea.   Left arm tingling seeing ortho who did x-rays, placed her on prednisone, flexeril and gabapentin. Will be starting PT.   History of pituitary microadenoma last MRI was in January 2023.  Microadenoma has remained stable.  We have been checking TSH and prolactin levels-which have remained normal.   REVIEW OF SYSTEMS: Out of a complete 14 system review of symptoms, the patient complains only of the following symptoms, and all other reviewed systems are negative.  ALLERGIES: Allergies  Allergen Reactions   Dust Mite Extract    Grass Extracts [Gramineae Pollens]    Other     Weed pollen, mold    HOME MEDICATIONS: Outpatient Medications Prior to Visit  Medication Sig Dispense Refill   Atogepant (QULIPTA) 30 MG TABS Take 1 tablet (30 mg total) by mouth daily. 30 tablet 3   atorvastatin (LIPITOR) 10 MG tablet TAKE 1 TABLET BY MOUTH TWICE PER WEEK. 24 tablet 1   azelastine (ASTELIN) 0.1 % nasal spray Place 2 sprays into both nostrils as needed for rhinitis. Use in each nostril as directed 30 mL 3   Botulinum Toxin Type A (BOTOX) 200 units SOLR Provider to inject 155 units into the muscles of the head and neck every 12 weeks. Discard remainder. 1 each 3   cyclobenzaprine (FLEXERIL) 5 MG tablet Take 1 tablet (5 mg total) by mouth 3 (three) times daily as  needed for muscle spasms. 30 tablet 1   fluticasone (FLONASE) 50 MCG/ACT nasal spray Place 2 sprays into both nostrils as needed for allergies or rhinitis. 16 g 3   gabapentin (NEURONTIN) 300 MG capsule Take 1 capsule (300 mg total) by mouth at bedtime. 30 capsule 1   rizatriptan (MAXALT-MLT) 10 MG disintegrating tablet Take 1 tablet (10 mg total) by mouth as needed for migraine. May repeat in 2 hours if needed. Do not exceed 2 tablets in 24 hours. 9 tablet 11   methylPREDNISolone (MEDROL DOSEPAK) 4 MG TBPK tablet Take per packet instructions. Taper dosing. 1 each 0   No facility-administered medications prior to visit.    PAST MEDICAL HISTORY: Past Medical History:  Diagnosis Date   Allergy    Migraine     PAST SURGICAL HISTORY: Past Surgical History:  Procedure Laterality Date   ADENOIDECTOMY     CHOLECYSTECTOMY  2009    FAMILY HISTORY: Family History  Problem Relation Age of Onset   Cancer Mother        breast   Breast cancer Mother    Headache Mother        had them for a long time, less around 54-50 years old    Prostate cancer Father    CAD Father        bypass surgery   Headache Father    Cancer Maternal Aunt  breast   Breast cancer Maternal Aunt    Cancer Paternal Aunt        breast   Breast cancer Paternal Aunt    Migraines Brother     SOCIAL HISTORY: Social History   Socioeconomic History   Marital status: Married    Spouse name: Pamela Lin   Number of children: 0   Years of education: Not on file   Highest education level: Master's degree (e.g., MA, MS, MEng, MEd, MSW, MBA)  Occupational History    Employer: [employer]  Tobacco Use   Smoking status: Never    Passive exposure: Never   Smokeless tobacco: Never   Tobacco comments:    tried a few cigarettes in college   Vaping Use   Vaping Use: Never used  Substance and Sexual Activity   Alcohol use: Yes    Alcohol/week: 1.0 standard drink of alcohol    Types: 1 Standard drinks or  equivalent per week    Comment: socially   Drug use: Never   Sexual activity: Not Currently    Birth control/protection: None  Other Topics Concern   Not on file  Social History Narrative   Patient is right-handed. She lives with her husband in a one level home. Stopped drinking caffeine August 2020. For exercise she walks 4-5 times per week.    Social Determinants of Health   Financial Resource Strain: Not on file  Food Insecurity: Not on file  Transportation Needs: Not on file  Physical Activity: Not on file  Stress: Not on file  Social Connections: Not on file  Intimate Partner Violence: Not on file      PHYSICAL EXAM   Generalized: Well developed, in no acute distress   Neurological examination  Mentation: Alert oriented to time, place, history taking. Follows all commands speech and language fluent Cranial nerve II-XII: Pupils were equal round reactive to light. Extraocular movements were full, visual field were full on confrontational test. Facial sensation and strength were normal.  Head turning and shoulder shrug  were normal and symmetric. Motor: The motor testing reveals 5 over 5 strength of all 4 extremities. Good symmetric motor tone is noted throughout.  Sensory: Sensory testing is intact to soft touch on all 4 extremities. No evidence of extinction is noted.  Coordination: Cerebellar testing reveals good finger-nose-finger and heel-to-shin bilaterally.  Gait and station: Gait is normal.  DIAGNOSTIC DATA (LABS, IMAGING, TESTING) - I reviewed patient records, labs, notes, testing and imaging myself where available.  Lab Results  Component Value Date   WBC 4.6 01/25/2022   HGB 13.7 01/25/2022   HCT 40.8 01/25/2022   MCV 89 01/25/2022   PLT 179 01/25/2022      Component Value Date/Time   NA 143 05/31/2022 0845   K 4.7 05/31/2022 0845   CL 106 05/31/2022 0845   CO2 25 05/31/2022 0845   GLUCOSE 99 05/31/2022 0845   GLUCOSE 101 (H) 05/06/2008 0650   BUN 10  05/31/2022 0845   CREATININE 0.71 05/31/2022 0845   CALCIUM 9.4 05/31/2022 0845   PROT 6.8 05/31/2022 0845   ALBUMIN 4.4 05/31/2022 0845   AST 15 05/31/2022 0845   ALT 16 05/31/2022 0845   ALKPHOS 69 05/31/2022 0845   BILITOT 0.5 05/31/2022 0845   GFRNONAA 103 04/28/2020 0903   GFRAA 119 04/28/2020 0903   Lab Results  Component Value Date   CHOL 182 05/31/2022   HDL 65 05/31/2022   LDLCALC 101 (H) 05/31/2022   TRIG  87 05/31/2022   CHOLHDL 2.8 05/31/2022   Lab Results  Component Value Date   HGBA1C 5.4 01/25/2022   Lab Results  Component Value Date   VITAMINB12 354 12/24/2019   Lab Results  Component Value Date   TSH 1.650 01/25/2022      ASSESSMENT AND PLAN 52 y.o. year old female  has a past medical history of Allergy and Migraine. here with:  1.  Migraine headaches  -Increase Qulipta to 60 mg daily -Encouraged her to keep a headache journal -Will check blood work today TSH and prolactin level -She will proceed with Botox injections today -If there is no improvement in her migraine she should let us know     Butch Penny, MSN, NP-C 01/08/2023, 3:19 PM [facility] [address], [address] [place], [place] [address] [phone]

## 2023-01-08 NOTE — Progress Notes (Signed)
Botox- 200 units x 1 vial Lot: [id number]  Expiration: 02/2025 NDC: 4540-9811-91  Bacteriostatic 0.9% Sodium Chloride- 4 mL  Lot: [id number] Expiration: 10/09/2023 NDC: 6213-0865-78  Dx: I69.629 B/B Witnessed by Maryjean Ka

## 2023-01-08 NOTE — Progress Notes (Signed)
01/08/23: Still having daily headaches.  Remains on Qulipta.  Sleep consult was negative for sleep apnea  09/26/22:having daily headaches for the 2.5 months. Can function but has a headache daily. Usually wakes up with the headache. Not sure how much nurtec works. Hasn't taken maxalt unless its a severe headache. Headache located across the forehead and behind the right ear.   Patients reports that she has never had a sleep test. She does snore. Feels tired throughout the day.  Tried and failed medication: Nortriptyline, Trokendi, sertraline, Ajovy, tizanidine, Nurtec.   06/29/22: Botox continues to work well.   03/20/22: Migraines still under good control.   12/26/21: Botox working well. Didn't really have many headaches until she was getting due for botox.  09/21/21: Headaches have worsened. Daily headaches on the pain scale its 3-4. Takes nurtec 75 mg every other day.  She does report that she has allergies.  Reports that when she would palpate above her eyes it was sore over the last week.  She is congested today.  But states that she is not like this every day.  She does not take any daily medication for allergies.  Patient was encouraged to follow-up with her PCP as her sinuses could be contributing to daily headaches  06/29/21: Botox is helping. Hx of pituitary adenoma.  We will recheck prolactin and TSH today.  We will repeat MRI of the brain.  BOTOX PROCEDURE NOTE FOR MIGRAINE HEADACHE    Contraindications and precautions discussed with patient(above). Aseptic procedure was observed and patient tolerated procedure. Procedure performed by Butch Penny, NP  The condition has existed for more than 6 months, and pt does not have a diagnosis of ALS, Myasthenia Gravis or Lambert-Eaton Syndrome.  Risks and benefits of injections discussed and pt agrees to proceed with the procedure.  Written consent obtained   Indication/Diagnosis: chronic migraine BOTOX(J0585) injection was performed  according to protocol by [employer]. 200 units of BOTOX was dissolved into 4 cc NS.   NDC: [address]-40   Botox- 200 units x 1 vial Lot: [id number]  Expiration: 02/2025 NDC: 7829-5621-30   Bacteriostatic 0.9% Sodium Chloride- 4 mL  Lot: [id number] Expiration: 10/09/2023 NDC: 6962-9528-41   Dx: L24.40    Description of procedure:  The patient was placed in a sitting position. The standard protocol was used for Botox as follows, with 5 units of Botox injected at each site:   -Procerus muscle, midline injection  -Corrugator muscle, bilateral injection  -Frontalis muscle, bilateral injection, with 2 sites each side, medial injection was performed in the upper one third of the frontalis muscle, in the region vertical from the medial inferior edge of the superior orbital rim. The lateral injection was again in the upper one third of the forehead vertically above the lateral limbus of the cornea, 1.5 cm lateral to the medial injection site.  -Temporalis muscle injection, 4 sites, bilaterally. The first injection was 3 cm above the tragus of the ear, second injection site was 1.5 cm to 3 cm up from the first injection site in line with the tragus of the ear. The third injection site was 1.5-3 cm forward between the first 2 injection sites. The fourth injection site was 1.5 cm posterior to the second injection site.  -Occipitalis muscle injection, 3 sites, bilaterally. The first injection was done one half way between the occipital protuberance and the tip of the mastoid process behind the ear. The second injection site was done lateral and superior to the first, 1 fingerbreadth  from the first injection. The third injection site was 1 fingerbreadth superiorly and medially from the first injection site.  -Cervical paraspinal muscle injection, 2 sites, bilateral knee first injection site was 1 cm from the midline of the cervical spine, 3 cm inferior to the lower border of the occipital protuberance. The  second injection site was 1.5 cm superiorly and laterally to the first injection site.  -Trapezius muscle injection was performed at 3 sites, bilaterally. The first injection site was in the upper trapezius muscle halfway between the inflection point of the neck, and the acromion. The second injection site was one half way between the acromion and the first injection site. The third injection was done between the first injection site and the inflection point of the neck.   Will return for repeat injection in 3 months.   A 200 unit sof Botox was used, 155 units were injected, the rest of the Botox was wasted. The patient tolerated the procedure well, there were no complications of the above procedure.  Butch Penny, MSN, NP-C 01/08/2023, 3:11 PM [facility] [address], [address] [place], [place] [address] [phone]

## 2023-01-09 ENCOUNTER — Encounter: Payer: Self-pay | Admitting: Adult Health

## 2023-01-09 LAB — PROLACTIN: Prolactin: 10 ng/mL (ref 3.6–25.2)

## 2023-01-09 LAB — THYROID PANEL WITH TSH
Free Thyroxine Index: 2.5 (ref 1.2–4.9)
T3 Uptake Ratio: 31 % (ref 24–39)
T4, Total: 8.1 ug/dL (ref 4.5–12.0)
TSH: 0.991 u[IU]/mL (ref 0.450–4.500)

## 2023-01-22 ENCOUNTER — Other Ambulatory Visit (HOSPITAL_COMMUNITY): Payer: Self-pay

## 2023-01-31 ENCOUNTER — Other Ambulatory Visit: Payer: BC Managed Care – PPO

## 2023-02-08 ENCOUNTER — Encounter: Payer: BC Managed Care – PPO | Admitting: Family Medicine

## 2023-02-10 ENCOUNTER — Encounter: Payer: Self-pay | Admitting: Sports Medicine

## 2023-02-13 ENCOUNTER — Other Ambulatory Visit: Payer: Self-pay | Admitting: Family Medicine

## 2023-02-13 DIAGNOSIS — Z0000 Encounter for general adult medical examination without abnormal findings: Secondary | ICD-10-CM

## 2023-02-13 DIAGNOSIS — E7800 Pure hypercholesterolemia, unspecified: Secondary | ICD-10-CM

## 2023-02-16 ENCOUNTER — Other Ambulatory Visit: Payer: Self-pay | Admitting: Family Medicine

## 2023-02-16 DIAGNOSIS — Z1231 Encounter for screening mammogram for malignant neoplasm of breast: Secondary | ICD-10-CM

## 2023-02-23 ENCOUNTER — Other Ambulatory Visit: Payer: BC Managed Care – PPO

## 2023-02-23 DIAGNOSIS — Z0000 Encounter for general adult medical examination without abnormal findings: Secondary | ICD-10-CM

## 2023-02-23 DIAGNOSIS — E7800 Pure hypercholesterolemia, unspecified: Secondary | ICD-10-CM

## 2023-02-24 LAB — CBC
Hematocrit: 43 % (ref 34.0–46.6)
Hemoglobin: 13.8 g/dL (ref 11.1–15.9)
MCH: 28.8 pg (ref 26.6–33.0)
MCHC: 32.1 g/dL (ref 31.5–35.7)
MCV: 90 fL (ref 79–97)
Platelets: 192 10*3/uL (ref 150–450)
RBC: 4.79 x10E6/uL (ref 3.77–5.28)
RDW: 12.1 % (ref 11.7–15.4)
WBC: 5.5 10*3/uL (ref 3.4–10.8)

## 2023-02-24 LAB — LIPID PANEL
Chol/HDL Ratio: 3 ratio (ref 0.0–4.4)
Cholesterol, Total: 192 mg/dL (ref 100–199)
HDL: 63 mg/dL (ref >39)
LDL Chol Calc (NIH): 108 mg/dL — ABNORMAL HIGH (ref 0–99)
Triglycerides: 118 mg/dL (ref 0–149)
VLDL Cholesterol Cal: 21 mg/dL (ref 5–40)

## 2023-02-24 LAB — COMPREHENSIVE METABOLIC PANEL
ALT: 18 IU/L (ref 0–32)
AST: 14 IU/L (ref 0–40)
Albumin: 4.3 g/dL (ref 3.8–4.9)
Alkaline Phosphatase: 80 IU/L (ref 44–121)
BUN/Creatinine Ratio: 10 (ref 9–23)
BUN: 7 mg/dL (ref 6–24)
Bilirubin Total: 0.3 mg/dL (ref 0.0–1.2)
CO2: 23 mmol/L (ref 20–29)
Calcium: 8.9 mg/dL (ref 8.7–10.2)
Chloride: 104 mmol/L (ref 96–106)
Creatinine, Ser: 0.68 mg/dL (ref 0.57–1.00)
Globulin, Total: 2.1 g/dL (ref 1.5–4.5)
Glucose: 86 mg/dL (ref 70–99)
Potassium: 3.9 mmol/L (ref 3.5–5.2)
Sodium: 142 mmol/L (ref 134–144)
Total Protein: 6.4 g/dL (ref 6.0–8.5)
eGFR: 103 mL/min/{1.73_m2} (ref >59)

## 2023-02-24 LAB — HEMOGLOBIN A1C
Est. average glucose Bld gHb Est-mCnc: 114 mg/dL
Hgb A1c MFr Bld: 5.6 % (ref 4.8–5.6)

## 2023-02-24 LAB — TSH: TSH: 1.16 u[IU]/mL (ref 0.450–4.500)

## 2023-03-01 ENCOUNTER — Encounter: Payer: Self-pay | Admitting: Sports Medicine

## 2023-03-01 ENCOUNTER — Ambulatory Visit: Payer: BC Managed Care – PPO | Admitting: Sports Medicine

## 2023-03-01 DIAGNOSIS — M5412 Radiculopathy, cervical region: Secondary | ICD-10-CM

## 2023-03-01 DIAGNOSIS — M25512 Pain in left shoulder: Secondary | ICD-10-CM | POA: Diagnosis not present

## 2023-03-01 DIAGNOSIS — M62830 Muscle spasm of back: Secondary | ICD-10-CM | POA: Diagnosis not present

## 2023-03-01 DIAGNOSIS — G8929 Other chronic pain: Secondary | ICD-10-CM

## 2023-03-01 NOTE — Progress Notes (Signed)
Shoulder is doing a lot better Had questions regarding the medication

## 2023-03-01 NOTE — Patient Instructions (Signed)
Mariangela,   Supplements helpful for arthritis and inflammation: Turmeric (curcumin) Boswellia serrata, collagen hydrolysate, Glucosamine-chondroitin  Foods helpful for arthritis and inflammation: Fish and foods containing omega-3's, leafy greens (broccoli, spinach), citrus fruits (grapefruit, oranges, lemons), green tea, blueberries, cherries, olive oil (EVO) Supplements helpful for muscle tightness/cramps: Tart Cherry Concentrate  *It is very important to stay hydrated, drinking lots of water throughout the day. This helps hydrate all joints and muscles.   - Dr. Shon Baton

## 2023-03-01 NOTE — Progress Notes (Signed)
Pamela Lin - 52 y.o. female MRN [id number]  Date of birth: 1970-08-30  Office Visit Note: Visit Date: 03/01/2023 PCP: Melida Quitter, PA Referred by: Melida Quitter, PA  Subjective: Chief Complaint  Patient presents with   Left Shoulder - Follow-up   HPI: Pamela Lin is a pleasant 52 y.o. female who presents today for follow-up of neck and left shoulder pain.  Saw Hasina back on 11/16/2022 -did take a 6-day Medrol Dosepak and then started gabapentin 300 mg at bedtime only.  She states that after completion of the Medrol Dosepak she had complete resolution of her numbness and tingling.  She has not had any numbness or tingling since.  She was sent to formalized physical therapy at [facility].  She has been doing this twice weekly as well as some home exercises.  In general feels much improved her pain is better but she still has some tightness and pain more so in the left trapezius.  Has done soft tissue massages as well. She is inquiring about gabapentin medication long-term today.  She does work for the school system, she is returning next week.  Pertinent ROS were reviewed with the patient and found to be negative unless otherwise specified above in HPI.   Assessment & Plan: Visit Diagnoses:  1. Chronic left shoulder pain   2. Spasm of left trapezius muscle   3. Radiculopathy, cervical region   4. Trigger point of left shoulder region    Plan: [name] has certainly made improvements in her concomitant left neck and shoulder pain.  Her radiculopathy has resolved since completion of her Medrol Dosepak.  She has and will discontinue her gabapentin 300 mg nightly given that she no longer has any radicular symptoms or numbness tingling.  The shoulder is moving fluidly today, I think more of her issue is resulting from her left trapezius hypertonicity and associated trigger points. Discussed importance of postural changes today. She is making good progress with formalized  physical therapy, she will work on transitioning to once weekly and then transition to home therapy.  Did discuss the role for possible trigger point injections over the left trapezius and medial scapular border, we will hold on these for now but could always ascertain in the future.  I did discuss and print out handout for supplements and foods that are helpful for anti-inflammatory purposes today, see AVS. She will follow-up with me as needed.  Follow-up: Return if symptoms worsen or fail to improve.   Meds & Orders: No orders of the defined types were placed in this encounter.  No orders of the defined types were placed in this encounter.    Procedures: No procedures performed      Clinical History: No specialty comments available.  She reports that she has never smoked. She has never been exposed to tobacco smoke. She has never used smokeless tobacco.  Recent Labs    02/23/23 0945  HGBA1C 5.6    Objective:   Vital Signs: There were no vitals taken for this visit.  Physical Exam  Gen: Well-appearing, in no acute distress; non-toxic CV: Well-perfused. Warm.  Resp: Breathing unlabored on room air; no wheezing. Psych: Fluid speech in conversation; appropriate affect; normal thought process Neuro: Sensation intact throughout. No gross coordination deficits.   Ortho Exam - Cervical: No midline spinous process TTP.  There is full range of motion with flexion and extension as well as rotation.  Right rotation does reproduce some pulling sensation over the  left cervical paraspinals and trapezius.  Negative Spurling's.  - Left shoulder: No bony TTP.  There is full active and passive range of motion of the shoulder in all directions.  Negative drop arm, negative empty can.  The left trapezius does have some hypertonicity compared to the right side.  Positive associated trigger points in the left trapezius and medial scapular border.  There is no scapular dyskinesia  today.  Imaging:  *Previous imaging: XR Shoulder Left 3 views of the left shoulder including Grashey, scapular Y and axial  femoral ordered and reviewed by myself.  No significant arthritic change.   Humeral head is well located within the glenohumeral joint.  No acute  fracture or otherwise bony abnormality noted. XR Cervical Spine 2 or 3 views 2 views of the cervical spine including AP and lateral femoral ordered and  reviewed by myself.  No significant scoliosis.  There is mild-moderate  degenerative disc disease at the C6/C7 level with loss of intervertebral  disc height and mild anterior spurring C6 greater than C7.  Otherwise no  acute bony fracture or significant arthropathy.    Past Medical/Family/Surgical/Social History: Medications & Allergies reviewed per EMR, new medications updated. Patient Active Problem List   Diagnosis Date Noted   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   Snoring 10/09/2022   Class 1 obesity due to excess calories without serious comorbidity with body mass index (BMI) of 34.0 to 34.9 in adult 10/09/2022   Pituitary microadenoma (HCC) 12/24/2019   Elevated LDL cholesterol level 01/29/2019   Chronic migraine without aura, with intractable migraine, so stated, with status migrainosus 01/29/2019   Obesity (BMI 30-39.9) 10/12/2016   History of environmental allergies 10/12/2016   Past Medical History:  Diagnosis Date   Allergy    Migraine    Family History  Problem Relation Age of Onset   Cancer Mother        breast   Breast cancer Mother    Headache Mother        had them for a long time, less around 20-25 years old    Prostate cancer Father    CAD Father        bypass surgery   Headache Father    Cancer Maternal Aunt        breast   Breast cancer Maternal Aunt    Cancer Paternal Aunt        breast   Breast cancer Paternal Aunt    Migraines Brother    Past Surgical History:  Procedure Laterality Date   ADENOIDECTOMY     CHOLECYSTECTOMY   2009   Social History   Occupational History    Employer: [employer]  Tobacco Use   Smoking status: Never    Passive exposure: Never   Smokeless tobacco: Never   Tobacco comments:    tried a few cigarettes in college   Vaping Use   Vaping status: Never Used  Substance and Sexual Activity   Alcohol use: Yes    Alcohol/week: 1.0 standard drink of alcohol    Types: 1 Standard drinks or equivalent per week    Comment: socially   Drug use: Never   Sexual activity: Not Currently    Birth control/protection: None

## 2023-03-14 ENCOUNTER — Telehealth: Payer: Self-pay | Admitting: Adult Health

## 2023-03-14 ENCOUNTER — Encounter: Payer: Self-pay | Admitting: Family Medicine

## 2023-03-14 ENCOUNTER — Ambulatory Visit (INDEPENDENT_AMBULATORY_CARE_PROVIDER_SITE_OTHER): Payer: BC Managed Care – PPO | Admitting: Family Medicine

## 2023-03-14 VITALS — BP 122/82 | HR 81 | Resp 18 | Ht 66.0 in | Wt 211.0 lb

## 2023-03-14 DIAGNOSIS — Z23 Encounter for immunization: Secondary | ICD-10-CM

## 2023-03-14 DIAGNOSIS — E7800 Pure hypercholesterolemia, unspecified: Secondary | ICD-10-CM | POA: Diagnosis not present

## 2023-03-14 DIAGNOSIS — Z0000 Encounter for general adult medical examination without abnormal findings: Secondary | ICD-10-CM

## 2023-03-14 DIAGNOSIS — F5104 Psychophysiologic insomnia: Secondary | ICD-10-CM | POA: Diagnosis not present

## 2023-03-14 MED ORDER — ATORVASTATIN CALCIUM 10 MG PO TABS: ORAL_TABLET | ORAL | 1 refills | Status: DC

## 2023-03-14 MED ORDER — TRAZODONE HCL 50 MG PO TABS: 50.0000 mg | ORAL_TABLET | Freq: Every day | ORAL | 1 refills | Status: DC

## 2023-03-14 MED ORDER — QULIPTA 60 MG PO TABS: 60.0000 mg | ORAL_TABLET | Freq: Every day | ORAL | 5 refills | Status: DC

## 2023-03-14 NOTE — Progress Notes (Signed)
Complete physical exam  Patient: Pamela Lin   DOB: 1971/01/03   52 y.o. Female  MRN: [id number]  Subjective:    Chief Complaint  Patient presents with   Annual Exam    Pamela Lin is a 52 y.o. female who presents today for a complete physical exam. She reports consuming a general diet.  She has started walking every morning after she wakes up.  She generally feels well. She reports sleeping poorly.  She struggles with sleep maintenance often waking up in the middle of the night.  She believes that her sleep and her anxiety are likely connected.  She does not have additional problems to discuss today.    Most recent fall risk assessment:    11/08/2022    4:10 PM  Fall Risk   Falls in the past year? 0  Number falls in past yr: 0  Injury with Fall? 0  Risk for fall due to : No Fall Risks  Follow up Falls evaluation completed     Most recent depression and anxiety screenings:    03/14/2023    4:17 PM 11/08/2022    4:10 PM  PHQ 2/9 Scores  PHQ - 2 Score 0 0  PHQ- 9 Score 3 4      03/14/2023    4:18 PM 11/08/2022    4:10 PM 01/31/2022   10:18 AM 01/25/2021   10:19 AM  GAD 7 : Generalized Anxiety Score  Nervous, Anxious, on Edge 1 0 1 0  Control/stop worrying 1 0 1 0  Worry too much - different things 1 0 1 1  Trouble relaxing 1 0 0 0  Restless 0 0 0 0  Easily annoyed or irritable 1 1 1 1   Afraid - awful might happen 0  1 0  Total GAD 7 Score 5  5 2   Anxiety Difficulty Somewhat difficult Not difficult at all      Patient Active Problem List   Diagnosis Date Noted   Psychophysiological insomnia 03/14/2023   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   Pituitary microadenoma (HCC) 12/24/2019   Elevated LDL cholesterol level 01/29/2019   Chronic migraine without aura, with intractable migraine, so stated, with status migrainosus 01/29/2019   History of environmental allergies 10/12/2016   Class 1 obesity due to excess calories without serious comorbidity with body  mass index (BMI) of 34.0 to 34.9 in adult 10/12/2016    Past Surgical History:  Procedure Laterality Date   ADENOIDECTOMY     CHOLECYSTECTOMY  2009   EYE SURGERY  2006   LASIK   Social History   Tobacco Use   Smoking status: Never    Passive exposure: Never   Smokeless tobacco: Never   Tobacco comments:    tried a few cigarettes in college   Vaping Use   Vaping status: Never Used  Substance Use Topics   Alcohol use: Yes    Alcohol/week: 1.0 standard drink of alcohol    Comment: 2 drinks per month   Drug use: No   Family History  Problem Relation Age of Onset   Cancer Mother        breast   Breast cancer Mother    Headache Mother        had them for a long time, less around 1-39 years old    Prostate cancer Father    CAD Father        bypass surgery   Headache Father    Cancer Father  Hearing loss Father    Heart disease Father    Cancer Maternal Aunt        breast   Breast cancer Maternal Aunt    Cancer Paternal Aunt        breast   Breast cancer Paternal Aunt    Migraines Brother    Allergies  Allergen Reactions   Dust Mite Extract    Grass Extracts [Gramineae Pollens]    Other     Weed pollen, mold     Patient Care Team: Melida Quitter, PA as PCP - General (Family Medicine) Drema Dallas, DO as Consulting Physician (Neurology)   Outpatient Medications Prior to Visit  Medication Sig   Atogepant (QULIPTA) 60 MG TABS Take 1 tablet (60 mg total) by mouth daily.   azelastine (ASTELIN) 0.1 % nasal spray Place 2 sprays into both nostrils as needed for rhinitis. Use in each nostril as directed   Botulinum Toxin Type A (BOTOX) 200 units SOLR Provider to inject 155 units into the muscles of the head and neck every 12 weeks. Discard remainder.   cyclobenzaprine (FLEXERIL) 5 MG tablet Take 1 tablet (5 mg total) by mouth 3 (three) times daily as needed for muscle spasms.   fluticasone (FLONASE) 50 MCG/ACT nasal spray Place 2 sprays into both nostrils as  needed for allergies or rhinitis.   gabapentin (NEURONTIN) 300 MG capsule Take 1 capsule (300 mg total) by mouth at bedtime.   methylPREDNISolone (MEDROL DOSEPAK) 4 MG TBPK tablet Take per packet instructions. Taper dosing.   rizatriptan (MAXALT-MLT) 10 MG disintegrating tablet Take 1 tablet (10 mg total) by mouth as needed for migraine. May repeat in 2 hours if needed. Do not exceed 2 tablets in 24 hours.   [DISCONTINUED] atorvastatin (LIPITOR) 10 MG tablet TAKE 1 TABLET BY MOUTH TWICE PER WEEK.   No facility-administered medications prior to visit.    Review of Systems  Constitutional:  Negative for chills, fever and malaise/fatigue.  HENT:  Negative for congestion and hearing loss.   Eyes:  Negative for blurred vision and double vision.  Respiratory:  Negative for cough and shortness of breath.   Cardiovascular:  Negative for chest pain, palpitations and leg swelling.  Gastrointestinal:  Negative for abdominal pain, constipation, diarrhea and heartburn.  Genitourinary:  Negative for frequency and urgency.  Musculoskeletal:  Negative for myalgias and neck pain.  Neurological:  Negative for headaches.  Endo/Heme/Allergies:  Negative for polydipsia.  Psychiatric/Behavioral:  Negative for depression. The patient has insomnia. The patient is not nervous/anxious.       Objective:    BP 122/82 (BP Location: Left Arm, Patient Position: Sitting, Cuff Size: Normal)   Pulse 81   Resp 18   Ht 5\' 6"  (1.676 m)   Wt 211 lb (95.7 kg)   SpO2 98%   BMI 34.06 kg/m    Physical Exam Constitutional:      General: She is not in acute distress.    Appearance: Normal appearance.  HENT:     Head: Normocephalic and atraumatic.     Right Ear: Tympanic membrane, ear canal and external ear normal.     Left Ear: Tympanic membrane, ear canal and external ear normal.     Nose: Nose normal.     Mouth/Throat:     Mouth: Mucous membranes are moist.     Pharynx: No oropharyngeal exudate or posterior  oropharyngeal erythema.  Eyes:     Extraocular Movements: Extraocular movements intact.     Conjunctiva/sclera:  Conjunctivae normal.     Pupils: Pupils are equal, round, and reactive to light.  Neck:     Thyroid: No thyroid mass, thyromegaly or thyroid tenderness.  Cardiovascular:     Rate and Rhythm: Normal rate and regular rhythm.     Heart sounds: Normal heart sounds. No murmur heard.    No friction rub. No gallop.  Pulmonary:     Effort: Pulmonary effort is normal. No respiratory distress.     Breath sounds: Normal breath sounds. No wheezing, rhonchi or rales.  Abdominal:     General: Abdomen is flat. Bowel sounds are normal. There is no distension.     Palpations: There is no mass.     Tenderness: There is no abdominal tenderness. There is no guarding.  Musculoskeletal:        General: Normal range of motion.     Cervical back: Normal range of motion and neck supple.  Lymphadenopathy:     Cervical: No cervical adenopathy.  Skin:    General: Skin is warm and dry.  Neurological:     Mental Status: She is alert and oriented to person, place, and time.     Cranial Nerves: No cranial nerve deficit.     Motor: No weakness.     Deep Tendon Reflexes: Reflexes normal.  Psychiatric:        Mood and Affect: Mood normal.       Assessment & Plan:    Routine Health Maintenance and Physical Exam  Immunization History  Administered Date(s) Administered   Influenza, Seasonal, Injecte, Preservative Fre 03/14/2023   Influenza,inj,Quad PF,6+ Mos 03/21/2019   Influenza-Unspecified 04/20/2017, 04/13/2018, 03/19/2019, 04/15/2020, 05/07/2021, 08/30/2022   PFIZER(Purple Top)SARS-COV-2 Vaccination 09/08/2019, 09/29/2019   Tdap 10/09/2007, 02/05/2018   Zoster Recombinant(Shingrix) 01/08/2020, 04/28/2020    Health Maintenance  Topic Date Due   Hepatitis C Screening  Never done   COVID-19 Vaccine (3 - Pfizer risk series) 10/27/2019   PAP SMEAR-Modifier  01/26/2024   MAMMOGRAM  03/20/2024    DTaP/Tdap/Td (3 - Td or Tdap) 02/06/2028   Colonoscopy  02/10/2030   INFLUENZA VACCINE  Completed   HIV Screening  Completed   Zoster Vaccines- Shingrix  Completed   HPV VACCINES  Aged Out    Reviewed most recent labs including CBC, CMP, lipid panel, A1C, TSH, and vitamin D. All within normal limits/stable from last check other than LDL slightly elevated 108 from 101 last time. Discussed health benefits of physical activity, and encouraged her to engage in regular exercise appropriate for her age and condition.  Wellness examination  Need for influenza vaccination -     Flu vaccine trivalent PF, 6mos and older(Flulaval,Afluria,Fluarix,Fluzone)  Elevated LDL cholesterol level Assessment & Plan: Last lipid panel: LDL 108, HDL 63, triglycerides 118.  Continue atorvastatin 10 mg daily.  Continue to monitor.  Orders: -     Atorvastatin Calcium; TAKE 1 TABLET BY MOUTH TWICE PER WEEK.  Dispense: 90 tablet; Refill: 1  Psychophysiological insomnia Assessment & Plan: Discussed starting with managing anxiety versus starting with managing insomnia.  She would like to start managing insomnia.  Trial of trazodone 50 mg nightly at bedtime with added benefit of alleviating anxiety, may increase to 100 mg nightly at bedtime after 1-2 weeks if needed.  Up in 6 weeks to assess efficacy and adjust management.  Orders: -     traZODone HCl; Take 1 tablet (50 mg total) by mouth at bedtime. May increase to 2 tablets after 1-2 weeks if needed.  Dispense: 60 tablet; Refill: 1    Return in about 6 weeks (around 04/25/2023) for follow-up for sleep.     Melida Quitter, PA

## 2023-03-14 NOTE — Telephone Encounter (Signed)
Pt request refill for Atogepant (QULIPTA) 60 MG TABS sent to  [facility]

## 2023-03-14 NOTE — Assessment & Plan Note (Signed)
Discussed starting with managing anxiety versus starting with managing insomnia.  She would like to start managing insomnia.  Trial of trazodone 50 mg nightly at bedtime with added benefit of alleviating anxiety, may increase to 100 mg nightly at bedtime after 1-2 weeks if needed.  Up in 6 weeks to assess efficacy and adjust management.

## 2023-03-14 NOTE — Assessment & Plan Note (Signed)
Last lipid panel: LDL 108, HDL 63, triglycerides 118.  Continue atorvastatin 10 mg daily.  Continue to monitor.

## 2023-03-14 NOTE — Telephone Encounter (Signed)
Six refills sent to [facility].

## 2023-03-16 ENCOUNTER — Encounter: Payer: Self-pay | Admitting: Adult Health

## 2023-03-16 ENCOUNTER — Encounter: Payer: Self-pay | Admitting: Family Medicine

## 2023-03-19 ENCOUNTER — Other Ambulatory Visit (HOSPITAL_COMMUNITY): Payer: Self-pay

## 2023-03-19 ENCOUNTER — Other Ambulatory Visit: Payer: Self-pay | Admitting: *Deleted

## 2023-03-19 ENCOUNTER — Telehealth: Payer: Self-pay

## 2023-03-19 ENCOUNTER — Telehealth: Payer: Self-pay | Admitting: *Deleted

## 2023-03-19 NOTE — Telephone Encounter (Signed)
Pharmacy Patient Advocate Encounter   Received notification from Physician's Office that prior authorization for Qulipta 60MG  tablets is required/requested.   Insurance verification completed.   The patient is insured through [employer] [facility] [employer] .   Per test claim: PA required; PA submitted to [facility] via CoverMyMeds Key/confirmation #/EOC [id number] Status is pending

## 2023-03-19 NOTE — Telephone Encounter (Signed)
Can you do urgent PA for Qulipta renewal? Pt states it is helping her headaches. She has been trying to fill it for a week and it shows a PA is due now.   KEY: [id number]  Message directly from pt about Qulipta: "Yes, since the dosage was increased I have noticed an improvement."

## 2023-03-19 NOTE — Progress Notes (Signed)
error 

## 2023-03-19 NOTE — Telephone Encounter (Signed)
PA request has been Submitted. New Encounter created for follow up. For additional info see Pharmacy Prior Auth telephone encounter from 03/19/2023.

## 2023-03-21 ENCOUNTER — Other Ambulatory Visit (HOSPITAL_COMMUNITY): Payer: Self-pay

## 2023-03-21 NOTE — Telephone Encounter (Addendum)
Wonderful, I have advised the pt to fill med now that its approved.

## 2023-03-21 NOTE — Telephone Encounter (Signed)
Pharmacy Patient Advocate Encounter  Received notification from [facility] that Prior Authorization for Qulipta 60MG  tablets has been APPROVED from 03/20/2023 to 03/18/2024. Ran test claim, Copay is $0 per 30DS. This test claim was processed through [facility]- copay amounts may vary at other pharmacies due to pharmacy/plan contracts, or as the patient moves through the different stages of their insurance plan.   PA #/Case ID/Reference #: PA Case ID #: [id number]

## 2023-03-30 ENCOUNTER — Ambulatory Visit
Admission: RE | Admit: 2023-03-30 | Discharge: 2023-03-30 | Disposition: A | Discharge status: Home / Self Care | Payer: BC Managed Care – PPO | Source: Ambulatory Visit | Attending: Family Medicine | Admitting: Family Medicine

## 2023-03-30 DIAGNOSIS — Z1231 Encounter for screening mammogram for malignant neoplasm of breast: Secondary | ICD-10-CM

## 2023-04-03 ENCOUNTER — Ambulatory Visit: Payer: BC Managed Care – PPO | Admitting: Adult Health

## 2023-04-03 NOTE — Progress Notes (Unsigned)
01/08/23: Still having daily headaches.  Remains on Qulipta.  Sleep consult was negative for sleep apnea  09/26/22:having daily headaches for the 2.5 months. Can function but has a headache daily. Usually wakes up with the headache. Not sure how much nurtec works. Hasn't taken maxalt unless its a severe headache. Headache located across the forehead and behind the right ear.   Patients reports that she has never had a sleep test. She does snore. Feels tired throughout the day.  Tried and failed medication: Nortriptyline, Trokendi, sertraline, Ajovy, tizanidine, Nurtec.   06/29/22: Botox continues to work well.   03/20/22: Migraines still under good control.   12/26/21: Botox working well. Didn't really have many headaches until she was getting due for botox.  09/21/21: Headaches have worsened. Daily headaches on the pain scale its 3-4. Takes nurtec 75 mg every other day.  She does report that she has allergies.  Reports that when she would palpate above her eyes it was sore over the last week.  She is congested today.  But states that she is not like this every day.  She does not take any daily medication for allergies.  Patient was encouraged to follow-up with her PCP as her sinuses could be contributing to daily headaches  06/29/21: Botox is helping. Hx of pituitary adenoma.  We will recheck prolactin and TSH today.  We will repeat MRI of the brain.  BOTOX PROCEDURE NOTE FOR MIGRAINE HEADACHE    Contraindications and precautions discussed with patient(above). Aseptic procedure was observed and patient tolerated procedure. Procedure performed by Butch Penny, NP  The condition has existed for more than 6 months, and pt does not have a diagnosis of ALS, Myasthenia Gravis or Lambert-Eaton Syndrome.  Risks and benefits of injections discussed and pt agrees to proceed with the procedure.  Written consent obtained   Indication/Diagnosis: chronic migraine BOTOX(J0585) injection was performed  according to protocol by [employer]. 200 units of BOTOX was dissolved into 4 cc NS.   NDC: [address]-78   Botox- 200 units x 1 vial Lot: [id number]  Expiration: 02/2025 NDC: 0865-7846-96   Bacteriostatic 0.9% Sodium Chloride- 4 mL  Lot: [id number] Expiration: 10/09/2023 NDC: 1324-4010-27   Dx: O53.66    Description of procedure:  The patient was placed in a sitting position. The standard protocol was used for Botox as follows, with 5 units of Botox injected at each site:   -Procerus muscle, midline injection  -Corrugator muscle, bilateral injection  -Frontalis muscle, bilateral injection, with 2 sites each side, medial injection was performed in the upper one third of the frontalis muscle, in the region vertical from the medial inferior edge of the superior orbital rim. The lateral injection was again in the upper one third of the forehead vertically above the lateral limbus of the cornea, 1.5 cm lateral to the medial injection site.  -Temporalis muscle injection, 4 sites, bilaterally. The first injection was 3 cm above the tragus of the ear, second injection site was 1.5 cm to 3 cm up from the first injection site in line with the tragus of the ear. The third injection site was 1.5-3 cm forward between the first 2 injection sites. The fourth injection site was 1.5 cm posterior to the second injection site.  -Occipitalis muscle injection, 3 sites, bilaterally. The first injection was done one half way between the occipital protuberance and the tip of the mastoid process behind the ear. The second injection site was done lateral and superior to the first, 1 fingerbreadth  from the first injection. The third injection site was 1 fingerbreadth superiorly and medially from the first injection site.  -Cervical paraspinal muscle injection, 2 sites, bilateral knee first injection site was 1 cm from the midline of the cervical spine, 3 cm inferior to the lower border of the occipital protuberance. The  second injection site was 1.5 cm superiorly and laterally to the first injection site.  -Trapezius muscle injection was performed at 3 sites, bilaterally. The first injection site was in the upper trapezius muscle halfway between the inflection point of the neck, and the acromion. The second injection site was one half way between the acromion and the first injection site. The third injection was done between the first injection site and the inflection point of the neck.   Will return for repeat injection in 3 months.   A 200 unit sof Botox was used, 155 units were injected, the rest of the Botox was wasted. The patient tolerated the procedure well, there were no complications of the above procedure.  Butch Penny, MSN, NP-C 04/03/2023, 3:20 PM [facility] [address] [place], [place] [address] [phone]

## 2023-04-04 ENCOUNTER — Ambulatory Visit: Payer: BC Managed Care – PPO | Admitting: Adult Health

## 2023-04-04 DIAGNOSIS — G43709 Chronic migraine without aura, not intractable, without status migrainosus: Secondary | ICD-10-CM | POA: Diagnosis not present

## 2023-04-04 MED ORDER — ONABOTULINUMTOXINA 200 UNITS IJ SOLR: 155.0000 [IU] | Freq: Once | INTRAMUSCULAR | Status: DC

## 2023-04-04 MED ORDER — ONABOTULINUMTOXINA 200 UNITS IJ SOLR
155.0000 [IU] | Freq: Once | INTRAMUSCULAR | Status: AC
  Administered 2023-04-04: 155 [IU] via INTRAMUSCULAR

## 2023-04-04 NOTE — Progress Notes (Signed)
Botox- 200 units x 1 vial Lot: [id number] Expiration: 06/2025 NDC: 4098-1191-47  Bacteriostatic 0.9% Sodium Chloride- 4 mL  Lot: [id number] Expiration: 10/09/2023 NDC: 1308-6578-46  Dx: N62.952  B/B Witnessed by Leeann Must RN

## 2023-05-02 ENCOUNTER — Encounter: Payer: Self-pay | Admitting: Neurology

## 2023-05-02 ENCOUNTER — Ambulatory Visit: Payer: BC Managed Care – PPO | Admitting: Neurology

## 2023-05-02 VITALS — BP 130/80 | HR 66 | Ht 66.0 in | Wt 209.6 lb

## 2023-05-02 DIAGNOSIS — M542 Cervicalgia: Secondary | ICD-10-CM | POA: Diagnosis not present

## 2023-05-02 DIAGNOSIS — G4486 Cervicogenic headache: Secondary | ICD-10-CM | POA: Diagnosis not present

## 2023-05-02 DIAGNOSIS — M7918 Myalgia, other site: Secondary | ICD-10-CM

## 2023-05-02 DIAGNOSIS — M2170 Unequal limb length (acquired), unspecified site: Secondary | ICD-10-CM | POA: Diagnosis not present

## 2023-05-02 NOTE — Progress Notes (Signed)
[facility]    Provider:  Dr Lucia Gaskins Requesting Provider: Melida Quitter, PA Primary Care Provider:  Melida Quitter, PA  CC:  Migraines  05/02/2023: Even with the botox an qulipta still having headaces every day. Her headaches are across her forehead and in the back of the head. Botox has helped more than anythingin the past but not as much the last year. The qulipta has not really helped. We tried Ajovy in the past. She wakes up with migraines. She takes trazodone at night for sleeping. She scrints her eyes a lot. She has a lot of neck pain and a lot of tightness. She uses a heating pad a lot.   MRI brain 2023: FINDINGS:  reviewed images and agree   No abnormal lesions are seen on diffusion-weighted views to suggest acute ischemia. The cortical sulci, fissures and cisterns are normal in size and appearance. Lateral, third and fourth ventricle are normal in size and appearance. No extra-axial fluid collections are seen. No evidence of mass effect or midline shift.  Stable 5 mm hypoenhancing cystic lesion within the left pituitary gland.  Pituitary stalk slightly deviated to the right side.  No other abnormal lesions are seen on post contrast views.     On sagittal views the posterior fossa, pituitary gland and corpus callosum are unremarkable. No evidence of intracranial hemorrhage on SWI views. The orbits and their contents, paranasal sinuses and calvarium are unremarkable.  Intracranial flow voids are present.     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  Patient complains of symptoms per HPI as well as the following symptoms: none . Pertinent negatives and positives per HPI. All others negative     04/04/23: reports that she couldn't get Qulipta for several weeks. During that time she was taking OTC medication. Headache frequency increased during  that time.Advised that if she runs our of Qulipta again- we can provide her with Samples.  She has a daily headache typically however she states that she gets busy she may not even notice it.  She does feel that the medicine helps with the severity of her headaches.  She saw commercial for Vyepti.  Curious if that would be helpful.  We will get her an office visit with Dr. Lucia Gaskins to discuss  01/08/23: Still having daily headaches.  Remains on Qulipta.  Sleep consult was negative for sleep apnea  09/26/22:having daily headaches for the 2.5 months. Can function but has a headache daily. Usually wakes up with the headache. Not sure how much nurtec works. Hasn't taken maxalt unless its a severe headache. Headache located across the forehead and behind the right ear.   Patients reports that she has never had a sleep test. She does snore. Feels tired throughout the day.  Tried and failed medication: Nortriptyline, Trokendi, sertraline, Ajovy, tizanidine, Nurtec.   06/29/22: Botox continues to work well.   03/20/22: Migraines still under good control.   12/26/21: Botox working well. Didn't really have many headaches until she was getting due for botox.  09/21/21: Headaches have worsened. Daily headaches on the pain scale its 3-4. Takes nurtec 75 mg every other day.  She does report that she has allergies.  Reports that when she would palpate above her eyes it was sore over the last week.  She is congested today.  But states that she is not like this every day.  She  does not take any daily medication for allergies.  Patient was encouraged to follow-up with her PCP as her sinuses could be contributing to daily headaches  06/29/21: Botox is helping. Hx of pituitary adenoma.  We will recheck prolactin and TSH today.  We will repeat MRI of the brain.   Virtual Visit via Video Note  I connected with Pamela Lin on 05/02/23 at  8:30 AM EDT by a video enabled telemedicine application and verified that I  am speaking with the correct person using two identifiers.  Location: Patient: home Provider: office   I discussed the limitations of evaluation and management by telemedicine and the availability of in person appointments. The patient expressed understanding and agreed to proceed.  History of Present Illness:  She is doing well on Ajovy. It has improved. 50% improvement in migraines and headaches. The ones she does have are less severe just across the forehead and mild, most of time she does not have to take anything. She has new symptom, abnormal smells after having covid. She has an MRI next month, if it continues we already have imaging scheduled recommend f/u with pcp, ENT, may also be a side effect of covid she had last month. Discussed other options, will try Nurtec qod to see if it helps any more.    Follow Up Instructions:    I discussed the assessment and treatment plan with the patient. The patient was provided an opportunity to ask questions and all were answered. The patient agreed with the plan and demonstrated an understanding of the instructions.   The patient was advised to call back or seek an in-person evaluation if the symptoms worsen or if the condition fails to improve as anticipated.  I provided 20 minutes of non-face-to-face time during this encounter.   Anson Fret, MD   HPI:  Pamela Lin is a 52 y.o. female here as requested by Melida Quitter, PA for migraines.  Patient has a long history of daily headaches, she cannot remember when she did not have them so it has been years decades probably.  She has them so much she gets used to it.  She has been on 7-8 or more medications per her neurologist, they would help for a little bit but not completely, she had an MRI, no causes of headaches, saw an incidental benign pituitary microadenoma.Started years or decades ago. No inciting events. They are across the forehead and more on one side than the other.  Can be more unilateral. Pulsating/throbbing/pounding. She has daily headaches.8 migraine days a month. Can have photophobia/phonophobi/smells, nausea. Laying down in a dark quiet room helps, she can't watch TV, sleep deprivation can trigger, she has allergy problems and that was make them worse, ibuprofen may help a little but the other medications she tried didn't seem to help long term. Migraines can last 24-72 hours and be moderately severe to severe. It is affecting her life and work. No symptoms of sleep apnea. No hx of head trauma. Brother with migraines, mother had headaches. She has dizziness, numbness and tingling, no aura, no medication overuse, No other focal neurologic deficits, associated symptoms, inciting events or modifiable factors.   Reviewed notes, labs and imaging from outside physicians, which showed:   From a review of records, medications that patient has tried not tolerated or failed, that can be used in migraine management include: Cymbalta, nortriptyline 50 mg (ineffective and cause changes in mood), Trokendi 100 mg at bedtime it was ineffective, sertraline  100 mg daily ineffective and increasing dose caused increased headaches, sertraline 50 mg daily with tizanidine 2 mg 3 times daily ineffective, Aleve/Advil.  No significant caffeine usage, no soda, appears to drink adequate water 80 to 100 ounces, no anxiety, some stress, chronic low back pain, adequate sleep hygiene averages 7 hours a day, migraine started in childhood but more prominent in college, bifrontal, can be nonthrobbing but also throbbing, and dull, she denied associated nausea, vomiting, photophobia, phonophobia, osmophobia, autonomic symptoms, visual disturbance or unilateral numbness or weakness, more intense headaches occur until she falls asleep, every 2 weeks, Advil or Aleve every few weeks, sleep deprivation and back pain and emotional stress exacerbated, laying down to rest and cold compresses on forehead relieve  it.  Reviewed images and agree with the following:  Cbc,cmp 01/2019: normal  MRI 12/15/2019: Brain: There is no acute infarction or intracranial hemorrhage. There is no intracranial mass, mass effect, or edema. There is no hydrocephalus or extra-axial fluid collection. Ventricles and sulci are within normal limits in size and configuration. Minimal small foci of T2 hyperintensity within the supratentorial white matter likely reflect nonspecific gliosis/demyelination of doubtful clinical significance. No abnormal enhancement.   Vascular: Major vessel flow voids at the skull base are preserved.   Skull and upper cervical spine: Normal marrow signal is preserved.   Sinuses/Orbits: Right frontoethmoid mucosal thickening with corresponding T1 hyperintensity likely reflecting inspissation. Orbits are unremarkable.   Other: There is a 5 mm T2 hyperintense lesion of the left aspect of the pituitary with enhancement. Mastoid air cells are clear.   IMPRESSION: Subcentimeter pituitary lesion likely reflecting a microadenoma.   Right frontoethmoid inflammatory changes. Nonspecific and of unknown relevance to reported headaches.  Review of Systems: Patient complains of symptoms per HPI as well as the following symptoms: headaches. Pertinent negatives and positives per HPI. All others negative.   Social History   Socioeconomic History   Marital status: Married    Spouse name: Carlis Abbott   Number of children: 0   Years of education: Not on file   Highest education level: Master's degree (e.g., MA, MS, MEng, MEd, MSW, MBA)  Occupational History    Employer: [employer]  Tobacco Use   Smoking status: Never    Passive exposure: Never   Smokeless tobacco: Never   Tobacco comments:    tried a few cigarettes in college   Vaping Use   Vaping status: Never Used  Substance and Sexual Activity   Alcohol use: Not Currently    Comment: 2 drinks per month   Drug use: No   Sexual  activity: Not Currently    Birth control/protection: None  Other Topics Concern   Not on file  Social History Narrative   Patient is right-handed. She lives with her husband in a one level home. Stopped drinking caffeine August 2020. For exercise she walks 4-5 times per week.    Social Determinants of Health   Financial Resource Strain: Not on file  Food Insecurity: Not on file  Transportation Needs: Not on file  Physical Activity: Not on file  Stress: Not on file  Social Connections: Not on file  Intimate Partner Violence: Not on file    Family History  Problem Relation Age of Onset   Cancer Mother        breast   Breast cancer Mother    Headache Mother        had them for a long time, less around 39-60 years old  Prostate cancer Father    CAD Father        bypass surgery   Headache Father    Cancer Father    Hearing loss Father    Heart disease Father    Cancer Maternal Aunt        breast   Breast cancer Maternal Aunt    Cancer Paternal Aunt        breast   Breast cancer Paternal Aunt    Migraines Brother     Past Medical History:  Diagnosis Date   Allergy    Migraine     Patient Active Problem List   Diagnosis Date Noted   Psychophysiological insomnia 03/14/2023   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   Pituitary microadenoma (HCC) 12/24/2019   Elevated LDL cholesterol level 01/29/2019   Chronic migraine without aura, with intractable migraine, so stated, with status migrainosus 01/29/2019   History of environmental allergies 10/12/2016   Class 1 obesity due to excess calories without serious comorbidity with body mass index (BMI) of 34.0 to 34.9 in adult 10/12/2016    Past Surgical History:  Procedure Laterality Date   ADENOIDECTOMY     CHOLECYSTECTOMY  2009   EYE SURGERY  2006   LASIK    Current Outpatient Medications  Medication Sig Dispense Refill   Atogepant (QULIPTA) 60 MG TABS Take 1 tablet (60 mg total) by mouth daily. 30 tablet 5    atorvastatin (LIPITOR) 10 MG tablet TAKE 1 TABLET BY MOUTH TWICE PER WEEK. 90 tablet 1   azelastine (ASTELIN) 0.1 % nasal spray Place 2 sprays into both nostrils as needed for rhinitis. Use in each nostril as directed 30 mL 3   Botulinum Toxin Type A (BOTOX) 200 units SOLR Provider to inject 155 units into the muscles of the head and neck every 12 weeks. Discard remainder. 1 each 3   cyclobenzaprine (FLEXERIL) 5 MG tablet Take 1 tablet (5 mg total) by mouth 3 (three) times daily as needed for muscle spasms. 30 tablet 1   fluticasone (FLONASE) 50 MCG/ACT nasal spray Place 2 sprays into both nostrils as needed for allergies or rhinitis. 16 g 3   Pseudoephedrine-Naproxen Na (ALEVE-D SINUS & COLD PO) Take by mouth.     rizatriptan (MAXALT-MLT) 10 MG disintegrating tablet Take 1 tablet (10 mg total) by mouth as needed for migraine. May repeat in 2 hours if needed. Do not exceed 2 tablets in 24 hours. 9 tablet 11   traZODone (DESYREL) 50 MG tablet Take 1 tablet (50 mg total) by mouth at bedtime. May increase to 2 tablets after 1-2 weeks if needed. 60 tablet 1   No current facility-administered medications for this visit.    Allergies as of 05/02/2023 - Review Complete 05/02/2023  Allergen Reaction Noted   Dust mite extract  10/12/2016   Grass extracts [gramineae pollens]  01/29/2019   Other  02/08/2007    Vitals: BP 130/80   Pulse 66   Ht 5\' 6"  (1.676 m)   Wt 209 lb 9.6 oz (95.1 kg)   BMI 33.83 kg/m  Last Weight:  Wt Readings from Last 1 Encounters:  05/02/23 209 lb 9.6 oz (95.1 kg)   Last Height:   Ht Readings from Last 1 Encounters:  05/02/23 5\' 6"  (1.676 m)  Exam: NAD, pleasant                  Speech:    Speech is normal; fluent and spontaneous with normal comprehension.  Cognition:  The patient is oriented to person, place, and time;     recent and remote memory intact;     language fluent;    Cranial Nerves:    The pupils are equal, round, and reactive to light.Trigeminal  sensation is intact and the muscles of mastication are normal. The face is symmetric. The palate elevates in the midline. Hearing intact. Voice is normal. Shoulder shrug is normal. The tongue has normal motion without fasciculations.   Coordination:  No dysmetria  Motor Observation:    No asymmetry, no atrophy, and no involuntary movements noted. Tone:    Normal muscle tone.     Strength:    Strength is V/V in the upper and lower limbs.      Sensation: intact to LT   Assessment/Plan: A lovely 52 year old patient with chronic intractable migraines for years, daily headaches, at least 10 migraine days a month, she is tried and failed multiple medications (per HPI). We started her on botox which has helped more than anything but she still has daily headaches that appear tension type or possibly cervicalgia?  I DISCUSSED THE FOLLOW ETIOLOGIES FOR DAILY HEADACHES WITH HER:  Sleep apnea?  Sleep test was negative  2. Clenching at bedtime? Can cause musculoskeletal head pain - may consider next botox injecting masseters and temples where the muscles tense up when clenching. Injected samples of dysport 5units each in the temples and masseters as a test. Lot [id number] 09/06/24  3. 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 Left shoulder drop with left leg discrepancy, need inserts - will discuss with PT. 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 Please evaluate and treat including dry needling, stretching, strengthening, manual therapy/massage,  heating, TENS unit, exercising for scapular stabilization, pectoral stretching and rhomboid strengthening as clinically warranted as well as any other modality as recommended by evaluation.  Patient also has Leg Lebgth discrepancy can PT help with that (contributing to left shoulder drop and cervicalgia) or provide inserts?  4. Occipital Neuralgia?: Due to neck pain, tight muscles, arthritis in the neck,   5. Eye pain: likely due to a nerve that causes migraines: discussed supratrochlear distribution of her neck pain  Reviewed the following images and etiologies with patient:         - Tension-type headache:   A tension headache is a common type of headache that causes pain or discomfort in the head, scalp, or neck. It's often caused by tight muscles in the shoulders, neck, scalp, and jaw. Tension headaches can feel like a dull ache or squeezing, and they usually affect both sides of the head.  Tension headache Pain Steady ache, rather than throbbing Location Both sides of the head, often starting at the back and spreading forward Other symptoms Tightness or soreness in the shoulders, neck, or jaw Causes Stress, depression, anxiety, head injury, or holding the head and neck in an abnormal position Risk factors Gender, changes in estrogen levels, premenstrual syndrome, chronic overwork, too much or too little sleep, missing meals, alcohol or drug use     Orders Placed This Encounter  Procedures   Ambulatory referral to Physical Therapy  Cc: Melida Quitter, [place]  Naomie Dean, MD  [facility] [address] [place], [place] [address]  Phone [phone] Fax [phone]  I spent over 50 minutes of face-to-face and non-face-to-face time with patient on the  1. Cervicalgia   2. Cervical myofascial pain syndrome   3. Cervicogenic headache   4. Leg length discrepancy    diagnosis.  This included previsit chart review, lab review,  study review, order entry, electronic health record documentation, patient education on the different diagnostic and therapeutic options, counseling and coordination of care, risks and benefits of management, compliance, or risk factor reduction

## 2023-05-02 NOTE — Patient Instructions (Addendum)
Possibilities: daily headaches  Sleep apnea?  Test was negative  -Clenching at bedtime? Can cause musculoskeletal head pain - may consider next botox injecting masseters and temples where the muscle pop out  - Neck pain/tightness: Left shoulder drop and leg length discrepancy and left trapezius pain, PT with dry needling. Left shoulder drop with left leg discrepancy, need inserts - will see who I will recommend (will reach out to a few providers). Will discuss with PT next door and Terrilee Files - will let you know  Occipital Neuralgia?: Due to neck pain, tight muscles, arthritis in the neck,   Eye pain: likely due to a nerve that causes migraines:         - Tension-type headache:   A tension headache is a common type of headache that causes pain or discomfort in the head, scalp, or neck. It's often caused by tight muscles in the shoulders, neck, scalp, and jaw. Tension headaches can feel like a dull ache or squeezing, and they usually affect both sides of the head.  Tension headache Pain Steady ache, rather than throbbing Location Both sides of the head, often starting at the back and spreading forward Other symptoms Tightness or soreness in the shoulders, neck, or jaw Causes Stress, depression, anxiety, head injury, or holding the head and neck in an abnormal position Risk factors Gender, changes in estrogen levels, premenstrual syndrome, chronic overwork, too much or too little sleep, missing meals, alcohol or drug use

## 2023-05-03 ENCOUNTER — Encounter: Payer: Self-pay | Admitting: Neurology

## 2023-05-03 ENCOUNTER — Telehealth: Payer: Self-pay | Admitting: *Deleted

## 2023-05-03 NOTE — Telephone Encounter (Signed)
I called the pt and LVM (ok per DPR) advising that [facility] called pt to schedule. I provided pt with their number and asked her to call them back.

## 2023-05-10 ENCOUNTER — Ambulatory Visit: Payer: BC Managed Care – PPO | Admitting: Family Medicine

## 2023-05-30 ENCOUNTER — Ambulatory Visit: Payer: BC Managed Care – PPO | Attending: Neurology

## 2023-05-30 ENCOUNTER — Other Ambulatory Visit: Payer: Self-pay

## 2023-05-30 DIAGNOSIS — M6281 Muscle weakness (generalized): Secondary | ICD-10-CM | POA: Insufficient documentation

## 2023-05-30 DIAGNOSIS — M542 Cervicalgia: Secondary | ICD-10-CM | POA: Insufficient documentation

## 2023-05-30 DIAGNOSIS — R293 Abnormal posture: Secondary | ICD-10-CM | POA: Insufficient documentation

## 2023-05-30 DIAGNOSIS — G4486 Cervicogenic headache: Secondary | ICD-10-CM | POA: Insufficient documentation

## 2023-05-30 DIAGNOSIS — M2170 Unequal limb length (acquired), unspecified site: Secondary | ICD-10-CM | POA: Insufficient documentation

## 2023-05-30 DIAGNOSIS — R252 Cramp and spasm: Secondary | ICD-10-CM

## 2023-05-30 NOTE — Therapy (Signed)
OUTPATIENT PHYSICAL THERAPY CERVICAL EVALUATION   Patient Name: Pamela Lin MRN: [id number] DOB:[birth year], 52 y.o., female Today's Date: 05/30/2023  END OF SESSION:  PT End of Session - 05/30/23 1631     Visit Number 1    Date for PT Re-Evaluation 07/25/23    Authorization Type BCBS    PT Start Time 1620    PT Stop Time 1700    PT Time Calculation (min) 40 min    Activity Tolerance Patient tolerated treatment well    Behavior During Therapy [facility] for tasks assessed/performed             Past Medical History:  Diagnosis Date   Allergy    Migraine    Past Surgical History:  Procedure Laterality Date   ADENOIDECTOMY     CHOLECYSTECTOMY  2009   EYE SURGERY  2006   LASIK   Patient Active Problem List   Diagnosis Date Noted   Psychophysiological insomnia 03/14/2023   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   Pituitary microadenoma (HCC) 12/24/2019   Elevated LDL cholesterol level 01/29/2019   Chronic migraine without aura, with intractable migraine, so stated, with status migrainosus 01/29/2019   History of environmental allergies 10/12/2016   Class 1 obesity due to excess calories without serious comorbidity with body mass index (BMI) of 34.0 to 34.9 in adult 10/12/2016    PCP: Melida Quitter, PA   REFERRING PROVIDER: Anson Fret, MD  REFERRING DIAG:  Diagnosis  M54.2 (ICD-10-CM) - Cervicalgia  M79.18 (ICD-10-CM) - Cervical myofascial pain syndrome  G44.86 (ICD-10-CM) - Cervicogenic headache  M21.70 (ICD-10-CM) - Leg length discrepancy    THERAPY DIAG:  Cervicalgia - Plan: PT plan of care cert/re-cert  Cramp and spasm - Plan: PT plan of care cert/re-cert  Muscle weakness (generalized) - Plan: PT plan of care cert/re-cert  Abnormal posture - Plan: PT plan of care cert/re-cert  Rationale for Evaluation and Treatment: Rehabilitation  ONSET DATE: 05/02/2023  SUBJECTIVE:                                                                                                                                                                                                          SUBJECTIVE STATEMENT: Patient reports chronic headaches for several years but more recently, she has been having neck and upper back pain.  She describes pain in the upper traps as well.  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  She admits that her job is quite stressful but  she is very close to retirement.  She really has no option than to finish out her time.  She has difficulty sleeping, headaches daily, neck and shoulder pain.  She hopes to reduce her pain and to be able to eliminate daily headaches.  She would like to be able to stay at her job until retirement.   Hand dominance: Right  PERTINENT HISTORY:  Chronic headaches  PAIN:  Are you having pain? Yes: NPRS scale: 8/10 Pain location: head, neck, shoulders, upper back Pain description: aching,constant Aggravating factors: computer work Relieving factors: medication,rest  PRECAUTIONS: None  RED FLAGS: None     WEIGHT BEARING RESTRICTIONS: No  FALLS:  Has patient fallen in last 6 months? No  LIVING ENVIRONMENT: Lives with: lives with their family Lives in: House/apartment   OCCUPATION: Engineer, materials  PLOF: Independent, Independent with basic ADLs, Independent with household mobility without device, Independent with community mobility without device, Independent with homemaking with ambulation, Independent with gait, and Independent with transfers  PATIENT GOALS: She hopes to reduce her pain and to be able to eliminate daily headaches.  She would like to be able to stay at her job until retirement.    NEXT MD VISIT: prn  OBJECTIVE:  Note: Objective measures were completed at Evaluation unless otherwise noted.  DIAGNOSTIC FINDINGS:  na  PATIENT SURVEYS:  FOTO 61, predicted 19  COGNITION: Overall cognitive  status: Within functional limits for tasks assessed  SENSATION: WFL  POSTURE: rounded shoulders and forward head  PALPATION: Taut bands and trigger points bilateral upper traps, tenderness sub occipitals  CERVICAL ROM:   Active ROM A/PROM (deg) eval  Flexion WFL  Extension WFL  Right lateral flexion 27  Left lateral flexion 30  Right rotation 55  Left rotation 45   (Blank rows = not tested)  UPPER EXTREMITY ROM:  WFL  UPPER EXTREMITY MMT:  Generally 4+/5 with exception of bilateral shoulder ER which was 4/5.   TODAY'S TREATMENT:                                                                                                                              DATE: 05/30/23 Initial eval completed and initiated HEP   PATIENT EDUCATION:  Education details: Initiated HEP Person educated: Patient Education method: Programmer, multimedia, Facilities manager, Verbal cues, and Handouts Education comprehension: verbalized understanding, returned demonstration, and verbal cues required  HOME EXERCISE PROGRAM: Access Code: [email or web address] URL: [email or web address] Date: 05/30/2023 Prepared by: Mikey Kirschner  Exercises - Seated Scapular Retraction  - 1 x daily - 7 x weekly - 3 sets - 10 reps - Seated Cervical Retraction  - 1 x daily - 7 x weekly - 1 sets - 5 reps - Seated Cervical Rotation AROM  - 1 x daily - 7 x weekly - 1 sets - 5 reps - Seated Cervical Sidebending AROM  - 1 x daily - 7 x weekly - 1 sets - 5 reps - Shoulder extension with resistance -  Neutral  - 1 x daily - 7 x weekly - 2 sets - 10 reps - Standing Shoulder Row with Anchored Resistance  - 1 x daily - 7 x weekly - 2 sets - 10 reps - Seated Shoulder External Rotation AAROM with Pulley  - 1 x daily - 7 x weekly - 2 sets - 10 reps - Seated Shoulder Horizontal Abduction with Resistance  - 1 x daily - 7 x weekly - 2 sets - 10 reps  ASSESSMENT:  CLINICAL IMPRESSION: Patient is a 52 y.o. female who was seen today for  physical therapy evaluation and treatment for chronic headaches, neck and shoulder pain.  She presents with decreased c spine ROM, weakness in bilateral shoulder ER, taut bands and trigger points in postural muscle groups.  Her symptoms are likely compounded by stress with her job.  She would benefit from skilled PT for techniques to reduce her stress along with postural strengthening, C spine ROM, shoulder strengthening, scapular stabiization and dry needling to improve ROM and restore proper postural mechanics which should reduce her frequency of headaches, neck and shoulder pain.    OBJECTIVE IMPAIRMENTS: decreased knowledge of condition, decreased ROM, decreased strength, increased fascial restrictions, increased muscle spasms, impaired flexibility, impaired UE functional use, obesity, and pain.   ACTIVITY LIMITATIONS: carrying, lifting, sitting, standing, sleeping, transfers, bed mobility, bathing, dressing, reach over head, and hygiene/grooming  PARTICIPATION LIMITATIONS: meal prep, cleaning, laundry, driving, shopping, community activity, occupation, yard work, school, and church  PERSONAL FACTORS: Fitness, Profession, and 1-2 comorbidities: obesity and insomnia  are also affecting patient's functional outcome.   REHAB POTENTIAL: Good  CLINICAL DECISION MAKING: Stable/uncomplicated  EVALUATION COMPLEXITY: Low   GOALS: Goals reviewed with patient? Yes  SHORT TERM GOALS: Target date: 06/27/2023  Patient to report pain no greater than 4/10  Baseline:  Goal status: INITIAL  2.  Patient will be independent with initial HEP  Baseline:  Goal status: INITIAL  3.  Frequency of headaches to be reduced to no more than 4 days per week Baseline:  Goal status: INITIAL    LONG TERM GOALS: Target date: 07/25/2023  Patient to report pain no greater than 2/10  Baseline:  Goal status: INITIAL  2.  Patient to be independent with advanced HEP  Baseline:  Goal status: INITIAL  3.   Headache frequency to be no more than 1 per week Baseline:  Goal status: INITIAL  4.  Patient to report 85% improvement in overall symptoms  Baseline:  Goal status: INITIAL  5.  Patient to be able to sleep through the night  Baseline:  Goal status: INITIAL  6.  Patient to verbalize stress reduction strategies Baseline:  Goal status: INITIAL   PLAN:  PT FREQUENCY: 1-2x/week  PT DURATION: 8 weeks  PLANNED INTERVENTIONS: 97110-Therapeutic exercises, 97530- Therapeutic activity, [id number]- Neuromuscular re-education, 97535- Self Care, 25366- Manual therapy, [id number]- Aquatic Therapy, 97014- Electrical stimulation (unattended), [id number]- Ultrasound, 44034- Traction (mechanical), [id number]- Ionotophoresis 4mg /ml Dexamethasone, Patient/Family education, Taping, Dry Needling, Joint mobilization, Spinal mobilization, Vestibular training, Visual/preceptual remediation/compensation, Cryotherapy, and Moist heat  PLAN FOR NEXT SESSION: UBE, review HEP, DN, postural strengthening.   Victorino Dike B. Eaton Folmar, PT 05/30/23 10:44 PM [facility] [address], [address] [place], [place] [address] Phone # [phone] Fax [phone]

## 2023-06-01 ENCOUNTER — Ambulatory Visit: Payer: BC Managed Care – PPO | Admitting: Family Medicine

## 2023-06-04 ENCOUNTER — Telehealth: Payer: Self-pay | Admitting: Adult Health

## 2023-06-04 NOTE — Telephone Encounter (Signed)
Pt's Botox PA expired 05/14/23, she has an upcoming appt on 07/02/23. I completed BCBS auth renewal form and placed in nurse pod for NP signature.

## 2023-06-06 ENCOUNTER — Ambulatory Visit: Payer: BC Managed Care – PPO | Admitting: Family Medicine

## 2023-06-06 ENCOUNTER — Encounter: Payer: Self-pay | Admitting: Family Medicine

## 2023-06-06 VITALS — BP 131/73 | HR 69 | Ht 66.0 in | Wt 213.1 lb

## 2023-06-06 DIAGNOSIS — F5104 Psychophysiologic insomnia: Secondary | ICD-10-CM

## 2023-06-06 DIAGNOSIS — R051 Acute cough: Secondary | ICD-10-CM | POA: Diagnosis not present

## 2023-06-06 MED ORDER — TRAZODONE HCL 50 MG PO TABS: 50.0000 mg | ORAL_TABLET | Freq: Every day | ORAL | 1 refills | Status: DC

## 2023-06-06 MED ORDER — AZELASTINE HCL 0.1 % NA SOLN: 2.0000 | NASAL | 3 refills | Status: AC | PRN

## 2023-06-06 MED ORDER — FLUTICASONE PROPIONATE 50 MCG/ACT NA SUSP: 2.0000 | NASAL | 3 refills | Status: AC | PRN

## 2023-06-06 NOTE — Assessment & Plan Note (Signed)
Continue trazodone 50 mg nightly at bedtime for insomnia and anxiety.  Discussed that she may adjust this dose anywhere from 25 mg to 100 mg at bedtime safely as needed.  Patient verbalized understanding and is agreeable to this plan.

## 2023-06-06 NOTE — Patient Instructions (Addendum)
It was nice to see you today!  If you want to switch magnesium in the case of GI upset, you can try magnesium glycinate.

## 2023-06-06 NOTE — Progress Notes (Signed)
Established Patient Office Visit  Subjective   Patient ID: Pamela Lin, female    DOB: [birth year]  Age: 52 y.o. MRN: [id number]  Chief Complaint  Patient presents with   Medical Management of Chronic Issues    HPI Pamela Lin is a 52 y.o. female presenting today for follow up of sleep. Sleep has been better, is taking 50 mg Trazodone. Tried 1/2 tablet (25mg ) but did not work. Still has a hard time waking up when getting up, feels "groggy" throughout the day but feels that she is better than before.  Her biggest complaint is that her eyes feel tired more than having low energy.  Has been taking Trazodone for two months and feels that it is helping. Before bed, avoids blue light up to an hour before. Upon waking, patient notes that it is hard to get out of bed. Discussed benefit of early morning introduction to bright light. Discussed further options for improving sleep quality.   Cough: Husband coughing 3 weeks. States that her cough started yesterday, non-productive. No noted sore throat, rhinorrhea, fever. Unsure of seasonal allergies but is beginning an allergy medication. Discussed follow up if syx continue to worsen.  Outpatient Medications Prior to Visit  Medication Sig   Atogepant (QULIPTA) 60 MG TABS Take 1 tablet (60 mg total) by mouth daily.   atorvastatin (LIPITOR) 10 MG tablet TAKE 1 TABLET BY MOUTH TWICE PER WEEK.   Botulinum Toxin Type A (BOTOX) 200 units SOLR Provider to inject 155 units into the muscles of the head and neck every 12 weeks. Discard remainder.   cyclobenzaprine (FLEXERIL) 5 MG tablet Take 1 tablet (5 mg total) by mouth 3 (three) times daily as needed for muscle spasms.   Pseudoephedrine-Naproxen Na (ALEVE-D SINUS & COLD PO) Take by mouth.   rizatriptan (MAXALT-MLT) 10 MG disintegrating tablet Take 1 tablet (10 mg total) by mouth as needed for migraine. May repeat in 2 hours if needed. Do not exceed 2 tablets in 24 hours.   [DISCONTINUED]  azelastine (ASTELIN) 0.1 % nasal spray Place 2 sprays into both nostrils as needed for rhinitis. Use in each nostril as directed   [DISCONTINUED] fluticasone (FLONASE) 50 MCG/ACT nasal spray Place 2 sprays into both nostrils as needed for allergies or rhinitis.   [DISCONTINUED] traZODone (DESYREL) 50 MG tablet Take 1 tablet (50 mg total) by mouth at bedtime. May increase to 2 tablets after 1-2 weeks if needed.   No facility-administered medications prior to visit.    ROS Negative unless otherwise noted in HPI   Objective:     BP 131/73   Pulse 69   Ht 5\' 6"  (1.676 m)   Wt 213 lb 1.9 oz (96.7 kg)   SpO2 98%   BMI 34.40 kg/m   Physical Exam Constitutional:      Appearance: Normal appearance.  HENT:     Nose: Nose normal. No congestion or rhinorrhea.     Mouth/Throat:     Mouth: Mucous membranes are moist.     Pharynx: Oropharynx is clear. No oropharyngeal exudate or posterior oropharyngeal erythema.  Eyes:     General:        Right eye: No discharge.        Left eye: No discharge.     Conjunctiva/sclera: Conjunctivae normal.  Cardiovascular:     Rate and Rhythm: Normal rate and regular rhythm.     Pulses: Normal pulses.     Heart sounds: Normal heart sounds. No murmur heard.  No friction rub.  Pulmonary:     Effort: Pulmonary effort is normal.     Breath sounds: No wheezing (R sided- upper and lower expiratory), rhonchi or rales.  Neurological:     Mental Status: She is alert.  Psychiatric:        Mood and Affect: Mood normal.        Behavior: Behavior normal.     Assessment & Plan:  Psychophysiological insomnia Assessment & Plan: Continue trazodone 50 mg nightly at bedtime for insomnia and anxiety.  Discussed that she may adjust this dose anywhere from 25 mg to 100 mg at bedtime safely as needed.  Patient verbalized understanding and is agreeable to this plan.  Orders: -     traZODone HCl; Take 1 tablet (50 mg total) by mouth at bedtime. May increase to 2  tablets after 1-2 weeks if needed.  Dispense: 90 tablet; Refill: 1  Acute cough  Other orders -     Azelastine HCl; Place 2 sprays into both nostrils as needed for rhinitis. Use in each nostril as directed  Dispense: 30 mL; Refill: 3 -     Fluticasone Propionate; Place 2 sprays into both nostrils as needed for allergies or rhinitis.  Dispense: 16 g; Refill: 3  Advised conservative care for acute cough, adequate hydration, over-the-counter measures for symptoms.  Recommended trial of eyedrops for possible eye fatigue that is contributing to the feeling of tired eyes without low energy levels.  Return in about 4 months (around 10/04/2023) for follow-up for mood.    Melida Quitter, PA

## 2023-06-11 ENCOUNTER — Ambulatory Visit: Payer: BC Managed Care – PPO | Attending: Neurology | Admitting: Rehabilitative and Restorative Service Providers"

## 2023-06-11 ENCOUNTER — Encounter: Payer: Self-pay | Admitting: Rehabilitative and Restorative Service Providers"

## 2023-06-11 DIAGNOSIS — R252 Cramp and spasm: Secondary | ICD-10-CM | POA: Insufficient documentation

## 2023-06-11 DIAGNOSIS — R293 Abnormal posture: Secondary | ICD-10-CM | POA: Insufficient documentation

## 2023-06-11 DIAGNOSIS — M542 Cervicalgia: Secondary | ICD-10-CM | POA: Diagnosis present

## 2023-06-11 DIAGNOSIS — M6281 Muscle weakness (generalized): Secondary | ICD-10-CM | POA: Insufficient documentation

## 2023-06-11 NOTE — Therapy (Signed)
OUTPATIENT PHYSICAL THERAPY TREATMENT NOTE   Patient Name: Pamela Lin MRN: [id number] DOB:12-08-1970, 52 y.o., female Today's Date: 06/11/2023  END OF SESSION:  PT End of Session - 06/11/23 0734     Visit Number 2    Date for PT Re-Evaluation 07/25/23    Authorization Type BCBS    PT Start Time 0732    PT Stop Time 0800    PT Time Calculation (min) 28 min    Activity Tolerance Patient tolerated treatment well    Behavior During Therapy [facility] for tasks assessed/performed             Past Medical History:  Diagnosis Date   Allergy    Migraine    Past Surgical History:  Procedure Laterality Date   ADENOIDECTOMY     CHOLECYSTECTOMY  2009   EYE SURGERY  2006   LASIK   Patient Active Problem List   Diagnosis Date Noted   Psychophysiological insomnia 03/14/2023   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   Pituitary microadenoma (HCC) 12/24/2019   Elevated LDL cholesterol level 01/29/2019   Chronic migraine without aura, with intractable migraine, so stated, with status migrainosus 01/29/2019   History of environmental allergies 10/12/2016   Class 1 obesity due to excess calories without serious comorbidity with body mass index (BMI) of 34.0 to 34.9 in adult 10/12/2016    PCP: Melida Quitter, PA   REFERRING PROVIDER: Anson Fret, MD  REFERRING DIAG:  Diagnosis  M54.2 (ICD-10-CM) - Cervicalgia  M79.18 (ICD-10-CM) - Cervical myofascial pain syndrome  G44.86 (ICD-10-CM) - Cervicogenic headache  M21.70 (ICD-10-CM) - Leg length discrepancy    THERAPY DIAG:  Cervicalgia  Cramp and spasm  Muscle weakness (generalized)  Abnormal posture  Rationale for Evaluation and Treatment: Rehabilitation  ONSET DATE: 05/02/2023  SUBJECTIVE:                                                                                                                                                                                                         SUBJECTIVE  STATEMENT: Patient states that she continues to have daily headaches.  States that she has been resting.  Hand dominance: Right  PERTINENT HISTORY:  Chronic headaches  PAIN:  Are you having pain? Yes: NPRS scale: 3/10 Pain location: head, neck, shoulders, upper back Pain description: aching,constant Aggravating factors: computer work Relieving factors: medication,rest  PRECAUTIONS: None  RED FLAGS: None     WEIGHT BEARING RESTRICTIONS: No  FALLS:  Has patient fallen in last 6 months? No  LIVING ENVIRONMENT: Lives with: lives with their family  Lives in: House/apartment   OCCUPATION: Engineer, materials  PLOF: Independent, Independent with basic ADLs, Independent with household mobility without device, Independent with community mobility without device, Independent with homemaking with ambulation, Independent with gait, and Independent with transfers  PATIENT GOALS: She hopes to reduce her pain and to be able to eliminate daily headaches.  She would like to be able to stay at her job until retirement.    NEXT MD VISIT: prn  OBJECTIVE:  Note: Objective measures were completed at Evaluation unless otherwise noted.  DIAGNOSTIC FINDINGS:  na  PATIENT SURVEYS:  Eval:  FOTO 61, predicted 91  COGNITION: Overall cognitive status: Within functional limits for tasks assessed  SENSATION: WFL  POSTURE: rounded shoulders and forward head  PALPATION: Taut bands and trigger points bilateral upper traps, tenderness sub occipitals  CERVICAL ROM:   Active ROM A/PROM (deg) eval  Flexion WFL  Extension WFL  Right lateral flexion 27  Left lateral flexion 30  Right rotation 55  Left rotation 45   (Blank rows = not tested)  UPPER EXTREMITY ROM:  WFL  UPPER EXTREMITY MMT:  Eval:  Generally 4+/5 with exception of bilateral shoulder ER which was 4/5.   TODAY'S TREATMENT:                                                                                                                                DATE: 06/11/2023 Nustep level 3 x5 min with PT present to discuss status Seated cervical retraction 2x10 Seated cervical rotation x10 Seated cervical sidebend x10 Standing shoulder rows and shoulder extension with red tband 2x10 each bilat Standing shoulder horizontal abduction with red tband 2x10 Standing shoulder ER with red tband 2x10 Trigger Point Dry-Needling  Treatment instructions: Expect mild to moderate muscle soreness. S/S of pneumothorax if dry needled over a lung field, and to seek immediate medical attention should they occur. Patient verbalized understanding of these instructions and education. Patient Consent Given: Yes Education handout provided: Previously provided Muscles treated: bilat upper traps, left rhomboid, bilat suboccipitals, bilat cervical multifidi Electrical stimulation performed: No Parameters: N/A Treatment response/outcome: Utilized skilled palpation to identify bony landmarks and trigger points.  Able to illicit twitch response and muscle elongation.  Soft tissue mobilization following to further promote tissue elongation.      DATE: 05/30/23 Initial eval completed and initiated HEP   PATIENT EDUCATION:  Education details: Initiated HEP Person educated: Patient Education method: Programmer, multimedia, Facilities manager, Verbal cues, and Handouts Education comprehension: verbalized understanding, returned demonstration, and verbal cues required  HOME EXERCISE PROGRAM: Access Code: [email or web address] URL: [email or web address] Date: 05/30/2023 Prepared by: Mikey Kirschner  Exercises - Seated Scapular Retraction  - 1 x daily - 7 x weekly - 3 sets - 10 reps - Seated Cervical Retraction  - 1 x daily - 7 x weekly - 1 sets - 5 reps - Seated Cervical Rotation AROM  - 1 x daily - 7 x weekly - 1 sets -  5 reps - Seated Cervical Sidebending AROM  - 1 x daily - 7 x weekly - 1 sets - 5 reps - Shoulder extension with resistance - Neutral   - 1 x daily - 7 x weekly - 2 sets - 10 reps - Standing Shoulder Row with Anchored Resistance  - 1 x daily - 7 x weekly - 2 sets - 10 reps - Seated Shoulder External Rotation AAROM with Pulley  - 1 x daily - 7 x weekly - 2 sets - 10 reps - Seated Shoulder Horizontal Abduction with Resistance  - 1 x daily - 7 x weekly - 2 sets - 10 reps  ASSESSMENT:  CLINICAL IMPRESSION: Ms Staal presents to skilled PT for first visit following initial evaluation reporting that she has not been doing her HEP as much as she should, as she was resting over the break.  Patient was able to perform exercises in session today that were part of her HEP with only minimal cuing required during exercises.  Patient agreeable to trying dry needling stating that she looked into the handout provided last time.  Patient with good twitch response noted, especially on right upper trap.  Patient with decreased tissue spacticity palpated following dry needling.  Patient continues to require skilled PT to progress towards goal related activities and decreased pain.   OBJECTIVE IMPAIRMENTS: decreased knowledge of condition, decreased ROM, decreased strength, increased fascial restrictions, increased muscle spasms, impaired flexibility, impaired UE functional use, obesity, and pain.   ACTIVITY LIMITATIONS: carrying, lifting, sitting, standing, sleeping, transfers, bed mobility, bathing, dressing, reach over head, and hygiene/grooming  PARTICIPATION LIMITATIONS: meal prep, cleaning, laundry, driving, shopping, community activity, occupation, yard work, school, and church  PERSONAL FACTORS: Fitness, Profession, and 1-2 comorbidities: obesity and insomnia  are also affecting patient's functional outcome.   REHAB POTENTIAL: Good  CLINICAL DECISION MAKING: Stable/uncomplicated  EVALUATION COMPLEXITY: Low   GOALS: Goals reviewed with patient? Yes  SHORT TERM GOALS: Target date: 06/27/2023  Patient to report pain no greater than  4/10  Baseline:  Goal status: Ongoing  2.  Patient will be independent with initial HEP  Baseline:  Goal status: Ongoing  3.  Frequency of headaches to be reduced to no more than 4 days per week Baseline:  Goal status: INITIAL    LONG TERM GOALS: Target date: 07/25/2023  Patient to report pain no greater than 2/10  Baseline:  Goal status: INITIAL  2.  Patient to be independent with advanced HEP  Baseline:  Goal status: INITIAL  3.  Headache frequency to be no more than 1 per week Baseline:  Goal status: INITIAL  4.  Patient to report 85% improvement in overall symptoms  Baseline:  Goal status: INITIAL  5.  Patient to be able to sleep through the night  Baseline:  Goal status: INITIAL  6.  Patient to verbalize stress reduction strategies Baseline:  Goal status: INITIAL   PLAN:  PT FREQUENCY: 1-2x/week  PT DURATION: 8 weeks  PLANNED INTERVENTIONS: 97110-Therapeutic exercises, 97530- Therapeutic activity, 97112- Neuromuscular re-education, 97535- Self Care, 16109- Manual therapy, [phone]- Aquatic Therapy, 97014- Electrical stimulation (unattended), 97035- Ultrasound, 09811- Traction (mechanical), [id number]- Ionotophoresis 4mg /ml Dexamethasone, Patient/Family education, Taping, Dry Needling, Joint mobilization, Spinal mobilization, Vestibular training, Visual/preceptual remediation/compensation, Cryotherapy, and Moist heat  PLAN FOR NEXT SESSION: Assess and progress HEP as indicated, strengthening, flexibility, manual/dry needling as indicated    Reather Laurence, PT, DPT 06/11/23, 8:17 AM  [facility] [address], [address] [place], [place]  [address] Phone # [phone] Fax [phone]

## 2023-06-13 ENCOUNTER — Encounter: Payer: Self-pay | Admitting: Rehabilitative and Restorative Service Providers"

## 2023-06-13 ENCOUNTER — Ambulatory Visit: Payer: BC Managed Care – PPO | Admitting: Rehabilitative and Restorative Service Providers"

## 2023-06-13 DIAGNOSIS — M542 Cervicalgia: Secondary | ICD-10-CM

## 2023-06-13 DIAGNOSIS — R252 Cramp and spasm: Secondary | ICD-10-CM

## 2023-06-13 DIAGNOSIS — M6281 Muscle weakness (generalized): Secondary | ICD-10-CM

## 2023-06-13 DIAGNOSIS — R293 Abnormal posture: Secondary | ICD-10-CM

## 2023-06-13 NOTE — Therapy (Signed)
OUTPATIENT PHYSICAL THERAPY TREATMENT NOTE   Patient Name: Pamela Lin MRN: [id number] DOB:[birth year], 52 y.o., female Today's Date: 06/13/2023  END OF SESSION:  PT End of Session - 06/13/23 0733     Visit Number 3    Date for PT Re-Evaluation 07/25/23    Authorization Type BCBS    PT Start Time 0730    PT Stop Time 0800    PT Time Calculation (min) 30 min    Activity Tolerance Patient tolerated treatment well    Behavior During Therapy [facility] for tasks assessed/performed             Past Medical History:  Diagnosis Date   Allergy    Migraine    Past Surgical History:  Procedure Laterality Date   ADENOIDECTOMY     CHOLECYSTECTOMY  2009   EYE SURGERY  2006   LASIK   Patient Active Problem List   Diagnosis Date Noted   Psychophysiological insomnia 03/14/2023   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   Pituitary microadenoma (HCC) 12/24/2019   Elevated LDL cholesterol level 01/29/2019   Chronic migraine without aura, with intractable migraine, so stated, with status migrainosus 01/29/2019   History of environmental allergies 10/12/2016   Class 1 obesity due to excess calories without serious comorbidity with body mass index (BMI) of 34.0 to 34.9 in adult 10/12/2016    PCP: Melida Quitter, PA   REFERRING PROVIDER: Anson Fret, MD  REFERRING DIAG:  Diagnosis  M54.2 (ICD-10-CM) - Cervicalgia  M79.18 (ICD-10-CM) - Cervical myofascial pain syndrome  G44.86 (ICD-10-CM) - Cervicogenic headache  M21.70 (ICD-10-CM) - Leg length discrepancy    THERAPY DIAG:  Cervicalgia  Cramp and spasm  Muscle weakness (generalized)  Abnormal posture  Rationale for Evaluation and Treatment: Rehabilitation  ONSET DATE: 05/02/2023  SUBJECTIVE:                                                                                                                                                                                                         SUBJECTIVE  STATEMENT: Patient reports that the dry needling seemed to really help and she is not having any cervical pain.  States that she is still having a headache, but is unsure if it is due to allergies.  Hand dominance: Right  PERTINENT HISTORY:  Chronic headaches  PAIN:  Are you having pain? Yes: NPRS scale: 3/10 Pain location: head Pain description: aching,constant Aggravating factors: computer work Relieving factors: medication,rest  PRECAUTIONS: None  RED FLAGS: None     WEIGHT BEARING RESTRICTIONS: No  FALLS:  Has  patient fallen in last 6 months? No  LIVING ENVIRONMENT: Lives with: lives with their family Lives in: House/apartment   OCCUPATION: Engineer, materials  PLOF: Independent, Independent with basic ADLs, Independent with household mobility without device, Independent with community mobility without device, Independent with homemaking with ambulation, Independent with gait, and Independent with transfers  PATIENT GOALS: She hopes to reduce her pain and to be able to eliminate daily headaches.  She would like to be able to stay at her job until retirement.    NEXT MD VISIT: prn  OBJECTIVE:  Note: Objective measures were completed at Evaluation unless otherwise noted.  DIAGNOSTIC FINDINGS:  na  PATIENT SURVEYS:  Eval:  FOTO 61, predicted 76  COGNITION: Overall cognitive status: Within functional limits for tasks assessed  SENSATION: WFL  POSTURE: rounded shoulders and forward head  PALPATION: Taut bands and trigger points bilateral upper traps, tenderness sub occipitals  CERVICAL ROM:   Active ROM A/PROM (deg) eval  Flexion WFL  Extension WFL  Right lateral flexion 27  Left lateral flexion 30  Right rotation 55  Left rotation 45   (Blank rows = not tested)  UPPER EXTREMITY ROM:  WFL  UPPER EXTREMITY MMT:  Eval:  Generally 4+/5 with exception of bilateral shoulder ER which was 4/5.   TODAY'S TREATMENT:                                                                                                                                DATE: 06/13/2023 UBE level 1.0 x2 min each direction with PT present to discuss status Supine MELT method with soft foam roll:  x20 with cervical rotation, x20 with head nod Supine with soft foam roll horizontal across thoracic spine x30 sec Supine with soft foam roll vertical down spine:  horizontal shoulder abduction with red tband 2x10 Supine with soft foam roll vertical down spine: shoulder ER with red tband 2x10 Standing shoulder D2 with red tband 2x10 bilat Standing lat pulldown 25# x20 Trigger Point Dry-Needling  Treatment instructions: Expect mild to moderate muscle soreness. S/S of pneumothorax if dry needled over a lung field, and to seek immediate medical attention should they occur. Patient verbalized understanding of these instructions and education. Patient Consent Given: Yes Education handout provided: Previously provided Muscles treated: bilat lumbar multifidi Electrical stimulation performed: No Parameters: N/A Treatment response/outcome: Utilized skilled palpation to identify bony landmarks and trigger points.  Able to illicit twitch response and muscle elongation.  Soft tissue mobilization following to further promote tissue elongation.     DATE: 06/11/2023 Nustep level 3 x5 min with PT present to discuss status Seated cervical retraction 2x10 Seated cervical rotation x10 Seated cervical sidebend x10 Standing shoulder rows and shoulder extension with red tband 2x10 each bilat Standing shoulder horizontal abduction with red tband 2x10 Standing shoulder ER with red tband 2x10 Trigger Point Dry-Needling  Treatment instructions: Expect mild to moderate muscle soreness. S/S of pneumothorax if dry needled  over a lung field, and to seek immediate medical attention should they occur. Patient verbalized understanding of these instructions and education. Patient Consent Given:  Yes Education handout provided: Previously provided Muscles treated: bilat upper traps, left rhomboid, bilat suboccipitals, bilat cervical multifidi Electrical stimulation performed: No Parameters: N/A Treatment response/outcome: Utilized skilled palpation to identify bony landmarks and trigger points.  Able to illicit twitch response and muscle elongation.  Soft tissue mobilization following to further promote tissue elongation.      PATIENT EDUCATION:  Education details: Initiated HEP Person educated: Patient Education method: Programmer, multimedia, Facilities manager, Verbal cues, and Handouts Education comprehension: verbalized understanding, returned demonstration, and verbal cues required  HOME EXERCISE PROGRAM: Access Code: [email or web address] URL: [email or web address] Date: 05/30/2023 Prepared by: Mikey Kirschner  Exercises - Seated Scapular Retraction  - 1 x daily - 7 x weekly - 3 sets - 10 reps - Seated Cervical Retraction  - 1 x daily - 7 x weekly - 1 sets - 5 reps - Seated Cervical Rotation AROM  - 1 x daily - 7 x weekly - 1 sets - 5 reps - Seated Cervical Sidebending AROM  - 1 x daily - 7 x weekly - 1 sets - 5 reps - Shoulder extension with resistance - Neutral  - 1 x daily - 7 x weekly - 2 sets - 10 reps - Standing Shoulder Row with Anchored Resistance  - 1 x daily - 7 x weekly - 2 sets - 10 reps - Seated Shoulder External Rotation AAROM with Pulley  - 1 x daily - 7 x weekly - 2 sets - 10 reps - Seated Shoulder Horizontal Abduction with Resistance  - 1 x daily - 7 x weekly - 2 sets - 10 reps  ASSESSMENT:  CLINICAL IMPRESSION: Pamela Lin presents to skilled PT reporting that she had great relief in her cervical pain with dry needling last session.  Patient stating that she is having some back tightness and is requesting dry needling to lumbar area at end of session, if time permits.  Patient with good response to MELT method with soft foam roll and reported decreased tension  noted.  Patient continues to progress with strengthening and postural strengthening exercises and able to add in lat pull-down today.  Patient with good response to lumbar multifidi dry needling with twitch response palpated on left upper lumbar region primarily.  Patient reports feeling less tension in her back following dry needling.  OBJECTIVE IMPAIRMENTS: decreased knowledge of condition, decreased ROM, decreased strength, increased fascial restrictions, increased muscle spasms, impaired flexibility, impaired UE functional use, obesity, and pain.   ACTIVITY LIMITATIONS: carrying, lifting, sitting, standing, sleeping, transfers, bed mobility, bathing, dressing, reach over head, and hygiene/grooming  PARTICIPATION LIMITATIONS: meal prep, cleaning, laundry, driving, shopping, community activity, occupation, yard work, school, and church  PERSONAL FACTORS: Fitness, Profession, and 1-2 comorbidities: obesity and insomnia  are also affecting patient's functional outcome.   REHAB POTENTIAL: Good  CLINICAL DECISION MAKING: Stable/uncomplicated  EVALUATION COMPLEXITY: Low   GOALS: Goals reviewed with patient? Yes  SHORT TERM GOALS: Target date: 06/27/2023  Patient to report pain no greater than 4/10  Baseline:  Goal status: Met for cervical pain on 06/13/2023  2.  Patient will be independent with initial HEP  Baseline:  Goal status: MET on 06/13/2023  3.  Frequency of headaches to be reduced to no more than 4 days per week Baseline:  Goal status: Ongoing    LONG TERM GOALS: Target date: 07/25/2023  Patient to report pain  no greater than 2/10  Baseline:  Goal status: INITIAL  2.  Patient to be independent with advanced HEP  Baseline:  Goal status: INITIAL  3.  Headache frequency to be no more than 1 per week Baseline:  Goal status: INITIAL  4.  Patient to report 85% improvement in overall symptoms  Baseline:  Goal status: INITIAL  5.  Patient to be able to sleep through  the night  Baseline:  Goal status: INITIAL  6.  Patient to verbalize stress reduction strategies Baseline:  Goal status: INITIAL   PLAN:  PT FREQUENCY: 1-2x/week  PT DURATION: 8 weeks  PLANNED INTERVENTIONS: 97110-Therapeutic exercises, 97530- Therapeutic activity, 97112- Neuromuscular re-education, 97535- Self Care, 25366- Manual therapy, [phone]- Aquatic Therapy, 97014- Electrical stimulation (unattended), 97035- Ultrasound, 74259- Traction (mechanical), [id number]- Ionotophoresis 4mg /ml Dexamethasone, Patient/Family education, Taping, Dry Needling, Joint mobilization, Spinal mobilization, Vestibular training, Visual/preceptual remediation/compensation, Cryotherapy, and Moist heat  PLAN FOR NEXT SESSION: Assess and progress HEP as indicated, strengthening, flexibility, manual/dry needling as indicated    Reather Laurence, PT, DPT 06/13/23, 9:06 AM  [facility] [address], [address] [place], [place] [address] Phone # [phone] Fax [phone]

## 2023-06-13 NOTE — Telephone Encounter (Signed)
Faxed signed continuation form along with OV notes to [facility] @ [phone].

## 2023-06-19 ENCOUNTER — Ambulatory Visit: Payer: BC Managed Care – PPO

## 2023-06-19 DIAGNOSIS — R293 Abnormal posture: Secondary | ICD-10-CM

## 2023-06-19 DIAGNOSIS — M542 Cervicalgia: Secondary | ICD-10-CM | POA: Diagnosis not present

## 2023-06-19 DIAGNOSIS — R252 Cramp and spasm: Secondary | ICD-10-CM

## 2023-06-19 DIAGNOSIS — M6281 Muscle weakness (generalized): Secondary | ICD-10-CM

## 2023-06-19 NOTE — Therapy (Signed)
OUTPATIENT PHYSICAL THERAPY TREATMENT NOTE   Patient Name: Pamela Lin MRN: [id number] DOB:06-Feb-1971, 52 y.o., female Today's Date: 06/19/2023  END OF SESSION:  PT End of Session - 06/19/23 0805     Visit Number 4    Date for PT Re-Evaluation 07/25/23    Authorization Type BCBS    PT Start Time 0730    PT Stop Time 0802    PT Time Calculation (min) 32 min    Activity Tolerance Patient tolerated treatment well    Behavior During Therapy [facility] for tasks assessed/performed              Past Medical History:  Diagnosis Date   Allergy    Migraine    Past Surgical History:  Procedure Laterality Date   ADENOIDECTOMY     CHOLECYSTECTOMY  2009   EYE SURGERY  2006   LASIK   Patient Active Problem List   Diagnosis Date Noted   Psychophysiological insomnia 03/14/2023   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   Pituitary microadenoma (HCC) 12/24/2019   Elevated LDL cholesterol level 01/29/2019   Chronic migraine without aura, with intractable migraine, so stated, with status migrainosus 01/29/2019   History of environmental allergies 10/12/2016   Class 1 obesity due to excess calories without serious comorbidity with body mass index (BMI) of 34.0 to 34.9 in adult 10/12/2016    PCP: Melida Quitter, PA   REFERRING PROVIDER: Anson Fret, MD  REFERRING DIAG:  Diagnosis  M54.2 (ICD-10-CM) - Cervicalgia  M79.18 (ICD-10-CM) - Cervical myofascial pain syndrome  G44.86 (ICD-10-CM) - Cervicogenic headache  M21.70 (ICD-10-CM) - Leg length discrepancy    THERAPY DIAG:  Cervicalgia  Cramp and spasm  Muscle weakness (generalized)  Abnormal posture  Rationale for Evaluation and Treatment: Rehabilitation  ONSET DATE: 05/02/2023  SUBJECTIVE:                                                                                                                                                                                                         SUBJECTIVE  STATEMENT: My neck feels ok, just tight. I'm doing the exercises at home.  This is a long drive so I want to reduce to 1x/wk so I want to do the dry needling today.    Hand dominance: Right  PERTINENT HISTORY:  Chronic headaches  PAIN:  Are you having pain? Yes: NPRS scale: 1-2/10 Pain location: head Pain description: aching,constant Aggravating factors: computer work Relieving factors: medication,rest  PRECAUTIONS: None  RED FLAGS: None     WEIGHT BEARING RESTRICTIONS: No  FALLS:  Has patient fallen in last 6 months? No  LIVING ENVIRONMENT: Lives with: lives with their family Lives in: House/apartment   OCCUPATION: Engineer, materials  PLOF: Independent, Independent with basic ADLs, Independent with household mobility without device, Independent with community mobility without device, Independent with homemaking with ambulation, Independent with gait, and Independent with transfers  PATIENT GOALS: She hopes to reduce her pain and to be able to eliminate daily headaches.  She would like to be able to stay at her job until retirement.    NEXT MD VISIT: prn  OBJECTIVE:  Note: Objective measures were completed at Evaluation unless otherwise noted.  DIAGNOSTIC FINDINGS:  na  PATIENT SURVEYS:  Eval:  FOTO 61, predicted 37  COGNITION: Overall cognitive status: Within functional limits for tasks assessed  SENSATION: WFL  POSTURE: rounded shoulders and forward head  PALPATION: Taut bands and trigger points bilateral upper traps, tenderness sub occipitals  CERVICAL ROM:   Active ROM A/PROM (deg) eval  Flexion WFL  Extension WFL  Right lateral flexion 27  Left lateral flexion 30  Right rotation 55  Left rotation 45   (Blank rows = not tested)  UPPER EXTREMITY ROM:  WFL  UPPER EXTREMITY MMT:  Eval:  Generally 4+/5 with exception of bilateral shoulder ER which was 4/5.   TODAY'S TREATMENT:                                                                                                                                DATE: 06/19/2023 UBE level 1.0 x2 min each direction with PT present to discuss status Provided information on Curable app and provided education regarding neuroscience of pain Info provided for MELT method and OPTP foam roller soft Trigger Point Dry-Needling  Treatment instructions: Expect mild to moderate muscle soreness. S/S of pneumothorax if dry needled over a lung field, and to seek immediate medical attention should they occur. Patient verbalized understanding of these instructions and education. Patient Consent Given: Yes Education handout provided: Previously provided Muscles treated: Bil upper traps, bil cervical multifidi, thoracic multifidi T1-7, Lt rhomboids, bil suboccipitals Electrical stimulation performed: No Treatment response/outcome: Utilized skilled palpation to identify bony landmarks and trigger points.  Able to illicit twitch response and muscle elongation.  Soft tissue mobilization following to further promote tissue elongation.    DATE: 06/13/2023 UBE level 1.0 x2 min each direction with PT present to discuss status Supine MELT method with soft foam roll:  x20 with cervical rotation, x20 with head nod Supine with soft foam roll horizontal across thoracic spine x30 sec Supine with soft foam roll vertical down spine:  horizontal shoulder abduction with red tband 2x10 Supine with soft foam roll vertical down spine: shoulder ER with red tband 2x10 Standing shoulder D2 with red tband 2x10 bilat Standing lat pulldown 25# x20 Trigger Point Dry-Needling  Treatment instructions: Expect mild to moderate muscle soreness. S/S of pneumothorax if dry needled over a lung field, and to seek  immediate medical attention should they occur. Patient verbalized understanding of these instructions and education. Patient Consent Given: Yes Education handout provided: Previously provided Muscles treated: bilat lumbar  multifidi Electrical stimulation performed: No Parameters: N/A Treatment response/outcome: Utilized skilled palpation to identify bony landmarks and trigger points.  Able to illicit twitch response and muscle elongation.  Soft tissue mobilization following to further promote tissue elongation.       DATE: 06/11/2023 Nustep level 3 x5 min with PT present to discuss status Seated cervical retraction 2x10 Seated cervical rotation x10 Seated cervical sidebend x10 Standing shoulder rows and shoulder extension with red tband 2x10 each bilat Standing shoulder horizontal abduction with red tband 2x10 Standing shoulder ER with red tband 2x10 Trigger Point Dry-Needling  Treatment instructions: Expect mild to moderate muscle soreness. S/S of pneumothorax if dry needled over a lung field, and to seek immediate medical attention should they occur. Patient verbalized understanding of these instructions and education. Patient Consent Given: Yes Education handout provided: Previously provided Muscles treated: bilat upper traps, left rhomboid, bilat suboccipitals, bilat cervical multifidi Electrical stimulation performed: No Parameters: N/A Treatment response/outcome: Utilized skilled palpation to identify bony landmarks and trigger points.  Able to illicit twitch response and muscle elongation.  Soft tissue mobilization following to further promote tissue elongation.    PATIENT EDUCATION:  Education details: Initiated HEP Person educated: Patient Education method: Programmer, multimedia, Facilities manager, Verbal cues, and Handouts Education comprehension: verbalized understanding, returned demonstration, and verbal cues required  HOME EXERCISE PROGRAM: Access Code: [email or web address] URL: [email or web address] Date: 05/30/2023 Prepared by: Mikey Kirschner  Exercises - Seated Scapular Retraction  - 1 x daily - 7 x weekly - 3 sets - 10 reps - Seated Cervical Retraction  - 1 x daily - 7 x weekly - 1 sets - 5  reps - Seated Cervical Rotation AROM  - 1 x daily - 7 x weekly - 1 sets - 5 reps - Seated Cervical Sidebending AROM  - 1 x daily - 7 x weekly - 1 sets - 5 reps - Shoulder extension with resistance - Neutral  - 1 x daily - 7 x weekly - 2 sets - 10 reps - Standing Shoulder Row with Anchored Resistance  - 1 x daily - 7 x weekly - 2 sets - 10 reps - Seated Shoulder External Rotation AAROM with Pulley  - 1 x daily - 7 x weekly - 2 sets - 10 reps - Seated Shoulder Horizontal Abduction with Resistance  - 1 x daily - 7 x weekly - 2 sets - 10 reps  ASSESSMENT:  CLINICAL IMPRESSION: Pt requested to reduce to 1x/wk for the next 2 weeks due to distance from home and work schedule.  PT educated pt regarding neuroscience of pain and Curable app.  Session focused on manual therapy as pt is doing exercises at home and has been consistent with this and postural modifications.  Patient with good response to neck and thoracic with twitch response Lt>Rt.   Patient reports feeling less tension in her Lt thoracic region following dry needling.  Patient will benefit from skilled PT to address the below impairments and improve overall function.   OBJECTIVE IMPAIRMENTS: decreased knowledge of condition, decreased ROM, decreased strength, increased fascial restrictions, increased muscle spasms, impaired flexibility, impaired UE functional use, obesity, and pain.   ACTIVITY LIMITATIONS: carrying, lifting, sitting, standing, sleeping, transfers, bed mobility, bathing, dressing, reach over head, and hygiene/grooming  PARTICIPATION LIMITATIONS: meal prep, cleaning, laundry, driving, shopping, community activity, occupation, yard work, school, and  church  PERSONAL FACTORS: Fitness, Profession, and 1-2 comorbidities: obesity and insomnia  are also affecting patient's functional outcome.   REHAB POTENTIAL: Good  CLINICAL DECISION MAKING: Stable/uncomplicated  EVALUATION COMPLEXITY: Low   GOALS: Goals reviewed with  patient? Yes  SHORT TERM GOALS: Target date: 06/27/2023  Patient to report pain no greater than 4/10  Baseline:  Goal status: Met for cervical pain on 06/13/2023  2.  Patient will be independent with initial HEP  Baseline:  Goal status: MET on 06/13/2023  3.  Frequency of headaches to be reduced to no more than 4 days per week Baseline:  Goal status: Ongoing    LONG TERM GOALS: Target date: 07/25/2023  Patient to report pain no greater than 2/10  Baseline:  Goal status: INITIAL  2.  Patient to be independent with advanced HEP  Baseline:  Goal status: INITIAL  3.  Headache frequency to be no more than 1 per week Baseline:  Goal status: INITIAL  4.  Patient to report 85% improvement in overall symptoms  Baseline:  Goal status: INITIAL  5.  Patient to be able to sleep through the night  Baseline:  Goal status: INITIAL  6.  Patient to verbalize stress reduction strategies Baseline:  Goal status: INITIAL   PLAN:  PT FREQUENCY: 1-2x/week  PT DURATION: 8 weeks  PLANNED INTERVENTIONS: 97110-Therapeutic exercises, 97530- Therapeutic activity, [id number]- Neuromuscular re-education, 97535- Self Care, 84696- Manual therapy, [id number]- Aquatic Therapy, 97014- Electrical stimulation (unattended), [id number]- Ultrasound, 29528- Traction (mechanical), [id number]- Ionotophoresis 4mg /ml Dexamethasone, Patient/Family education, Taping, Dry Needling, Joint mobilization, Spinal mobilization, Vestibular training, Visual/preceptual remediation/compensation, Cryotherapy, and Moist heat  PLAN FOR NEXT SESSION: Pt will cancel next visit due to long drive.  DN next session, add thoracic mobility-open book,thread the needle, cat/cow   Lorrene Reid, PT 06/19/23 8:19 AM   [facility] [address], [address] [place], [place] [address] Phone # [phone] Fax [phone]

## 2023-06-19 NOTE — Telephone Encounter (Signed)
[employer] Berkley Harvey was approved, pt will continue to be buy/bill.  Auth#: [id number] (06/13/23-05/14/24)

## 2023-06-21 ENCOUNTER — Ambulatory Visit: Payer: BC Managed Care – PPO

## 2023-06-22 ENCOUNTER — Encounter: Payer: Self-pay | Admitting: Family Medicine

## 2023-06-25 ENCOUNTER — Encounter: Payer: BC Managed Care – PPO | Admitting: Rehabilitative and Restorative Service Providers"

## 2023-06-28 ENCOUNTER — Ambulatory Visit: Payer: BC Managed Care – PPO

## 2023-07-02 ENCOUNTER — Ambulatory Visit: Payer: BC Managed Care – PPO | Admitting: Adult Health

## 2023-07-02 DIAGNOSIS — G43709 Chronic migraine without aura, not intractable, without status migrainosus: Secondary | ICD-10-CM

## 2023-07-02 MED ORDER — ONABOTULINUMTOXINA 200 UNITS IJ SOLR
155.0000 [IU] | Freq: Once | INTRAMUSCULAR | Status: AC
  Administered 2023-07-02: 155 [IU] via INTRAMUSCULAR

## 2023-07-02 NOTE — Progress Notes (Signed)
Botox- 200 units x 1 vial Lot: [id number] Expiration: 09/2025 NDC: 4540-9811-91  Bacteriostatic 0.9% Sodium Chloride- * mL  Lot: [id number] Expiration: 05/10/2024 NDC: 6213-0865-78  Dx: I69.629 B/B Witnessed by Cheron Every CMA

## 2023-07-02 NOTE — Progress Notes (Signed)
07/02/23: She states that her headaches have been manageable.  She still has a daily dull headache.  But only has approximately 3 migraines a month.   04/04/23: reports that she couldn't get Qulipta for several weeks. During that time she was taking OTC medication. Headache frequency increased during that time.Advised that if she runs our of Qulipta again- we can provide her with Samples.  She has a daily headache typically however she states that she gets busy she may not even notice it.  She does feel that the medicine helps with the severity of her headaches.  She saw commercial for Vyepti.  Curious if that would be helpful.  We will get her an office visit with Dr. Lucia Gaskins to discuss  01/08/23: Still having daily headaches.  Remains on Qulipta.  Sleep consult was negative for sleep apnea  09/26/22:having daily headaches for the 2.5 months. Can function but has a headache daily. Usually wakes up with the headache. Not sure how much nurtec works. Hasn't taken maxalt unless its a severe headache. Headache located across the forehead and behind the right ear.   Patients reports that she has never had a sleep test. She does snore. Feels tired throughout the day.  Tried and failed medication: Nortriptyline, Trokendi, sertraline, Ajovy, tizanidine, Nurtec.   06/29/22: Botox continues to work well.   03/20/22: Migraines still under good control.   12/26/21: Botox working well. Didn't really have many headaches until she was getting due for botox.  09/21/21: Headaches have worsened. Daily headaches on the pain scale its 3-4. Takes nurtec 75 mg every other day.  She does report that she has allergies.  Reports that when she would palpate above her eyes it was sore over the last week.  She is congested today.  But states that she is not like this every /day.  She does not take any daily medication for allergies.  Patient was encouraged to follow-up with her PCP as her sinuses could be contributing to daily  headaches  06/29/21: Botox is helping. Hx of pituitary adenoma.  We will recheck prolactin and TSH today.  We will repeat MRI of the brain.  BOTOX PROCEDURE NOTE FOR MIGRAINE HEADACHE    Contraindications and precautions discussed with patient(above). Aseptic procedure was observed and patient tolerated procedure. Procedure performed by Butch Penny, NP  The condition has existed for more than 6 months, and pt does not have a diagnosis of ALS, Myasthenia Gravis or Lambert-Eaton Syndrome.  Risks and benefits of injections discussed and pt agrees to proceed with the procedure.  Written consent obtained   Indication/Diagnosis: chronic migraine BOTOX(J0585) injection was performed according to protocol by [employer]. 200 units of BOTOX was dissolved into 4 cc NS.   NDC: [address]-46   Botox- 200 units x 1 vial Lot: [id number] Expiration: 09/2025 NDC: 4132-4401-02   Bacteriostatic 0.9% Sodium Chloride- * mL  Lot: [id number] Expiration: 05/10/2024 NDC: 4403-4742-59   Dx: D63.875     Description of procedure:  The patient was placed in a sitting position. The standard protocol was used for Botox as follows, with 5 units of Botox injected at each site:   -Procerus muscle, midline injection  -Corrugator muscle, bilateral injection  -Frontalis muscle, bilateral injection, with 2 sites each side, medial injection was performed in the upper one third of the frontalis muscle, in the region vertical from the medial inferior edge of the superior orbital rim. The lateral injection was again in the upper one third of the forehead vertically  above the lateral limbus of the cornea, 1.5 cm lateral to the medial injection site.  -Temporalis muscle injection, 4 sites, bilaterally. The first injection was 3 cm above the tragus of the ear, second injection site was 1.5 cm to 3 cm up from the first injection site in line with the tragus of the ear. The third injection site was 1.5-3 cm forward between  the first 2 injection sites. The fourth injection site was 1.5 cm posterior to the second injection site.  -Occipitalis muscle injection, 3 sites, bilaterally. The first injection was done one half way between the occipital protuberance and the tip of the mastoid process behind the ear. The second injection site was done lateral and superior to the first, 1 fingerbreadth from the first injection. The third injection site was 1 fingerbreadth superiorly and medially from the first injection site.  -Cervical paraspinal muscle injection, 2 sites, bilateral knee first injection site was 1 cm from the midline of the cervical spine, 3 cm inferior to the lower border of the occipital protuberance. The second injection site was 1.5 cm superiorly and laterally to the first injection site.  -Trapezius muscle injection was performed at 3 sites, bilaterally. The first injection site was in the upper trapezius muscle halfway between the inflection point of the neck, and the acromion. The second injection site was one half way between the acromion and the first injection site. The third injection was done between the first injection site and the inflection point of the neck.   Will return for repeat injection in 3 months.   A 200 unit sof Botox was used, 155 units were injected, the rest of the Botox was wasted. The patient tolerated the procedure well, there were no complications of the above procedure.  Butch Penny, MSN, NP-C 07/02/2023, 8:24 AM [facility] [address], [address] [place], [place] [address] [phone]

## 2023-07-03 ENCOUNTER — Ambulatory Visit: Payer: BC Managed Care – PPO

## 2023-07-03 DIAGNOSIS — M6281 Muscle weakness (generalized): Secondary | ICD-10-CM

## 2023-07-03 DIAGNOSIS — R293 Abnormal posture: Secondary | ICD-10-CM

## 2023-07-03 DIAGNOSIS — M542 Cervicalgia: Secondary | ICD-10-CM

## 2023-07-03 DIAGNOSIS — R252 Cramp and spasm: Secondary | ICD-10-CM

## 2023-07-03 NOTE — Therapy (Signed)
OUTPATIENT PHYSICAL THERAPY TREATMENT NOTE   Patient Name: Pamela Lin MRN: [id number] DOB:1970/10/03, 52 y.o., female Today's Date: 07/03/2023  END OF SESSION:  PT End of Session - 07/03/23 1152     Visit Number 5    Date for PT Re-Evaluation 07/25/23    Authorization Type [employer]    PT Start Time 1101    PT Stop Time 1150    PT Time Calculation (min) 49 min    Activity Tolerance Patient tolerated treatment well    Behavior During Therapy WFL for tasks assessed/performed               Past Medical History:  Diagnosis Date   Allergy    Migraine    Past Surgical History:  Procedure Laterality Date   ADENOIDECTOMY     CHOLECYSTECTOMY  2009   EYE SURGERY  2006   LASIK   Patient Active Problem List   Diagnosis Date Noted   Psychophysiological insomnia 03/14/2023   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   Pituitary microadenoma (HCC) 12/24/2019   Elevated LDL cholesterol level 01/29/2019   Chronic migraine without aura, with intractable migraine, so stated, with status migrainosus 01/29/2019   History of environmental allergies 10/12/2016   Class 1 obesity due to excess calories without serious comorbidity with body mass index (BMI) of 34.0 to 34.9 in adult 10/12/2016    PCP: Melida Quitter, PA   REFERRING PROVIDER: Anson Fret, MD  REFERRING DIAG:  Diagnosis  M54.2 (ICD-10-CM) - Cervicalgia  M79.18 (ICD-10-CM) - Cervical myofascial pain syndrome  G44.86 (ICD-10-CM) - Cervicogenic headache  M21.70 (ICD-10-CM) - Leg length discrepancy    THERAPY DIAG:  Cervicalgia  Cramp and spasm  Muscle weakness (generalized)  Abnormal posture  Rationale for Evaluation and Treatment: Rehabilitation  ONSET DATE: 05/02/2023  SUBJECTIVE:                                                                                                                                                                                                         SUBJECTIVE  STATEMENT: I have some pain at my Rt side at my rib.  I think it is from coughing.    Hand dominance: Right  PERTINENT HISTORY:  Chronic headaches  PAIN:  Are you having pain? Yes: NPRS scale: 1/10 Pain location: head Pain description: aching,constant Aggravating factors: computer work Relieving factors: medication,rest  PRECAUTIONS: None  RED FLAGS: None     WEIGHT BEARING RESTRICTIONS: No  FALLS:  Has patient fallen in last 6 months? No  LIVING ENVIRONMENT: Lives with: lives with  their family Lives in: House/apartment   OCCUPATION: Engineer, materials  PLOF: Independent, Independent with basic ADLs, Independent with household mobility without device, Independent with community mobility without device, Independent with homemaking with ambulation, Independent with gait, and Independent with transfers  PATIENT GOALS: She hopes to reduce her pain and to be able to eliminate daily headaches.  She would like to be able to stay at her job until retirement.    NEXT MD VISIT: prn  OBJECTIVE:  Note: Objective measures were completed at Evaluation unless otherwise noted.  DIAGNOSTIC FINDINGS:  na  PATIENT SURVEYS:  Eval:  FOTO 61, predicted 24  COGNITION: Overall cognitive status: Within functional limits for tasks assessed  SENSATION: WFL  POSTURE: rounded shoulders and forward head  PALPATION: Taut bands and trigger points bilateral upper traps, tenderness sub occipitals  CERVICAL ROM:   Active ROM A/PROM (deg) eval  Flexion WFL  Extension WFL  Right lateral flexion 27  Left lateral flexion 30  Right rotation 55  Left rotation 45   (Blank rows = not tested)  UPPER EXTREMITY ROM:  WFL  UPPER EXTREMITY MMT:  Eval:  Generally 4+/5 with exception of bilateral shoulder ER which was 4/5.   TODAY'S TREATMENT:                                                                                                                              DATE:  07/03/2023 Thoracic mobility: childs pose, cat cow, open book   Trigger Point Dry-Needling  Treatment instructions: Expect mild to moderate muscle soreness. S/S of pneumothorax if dry needled over a lung field, and to seek immediate medical attention should they occur. Patient verbalized understanding of these instructions and education. Patient Consent Given: Yes Education handout provided: Previously provided Muscles treated: Bil upper traps, bil cervical multifidi, thoracic multifidi T1-7, Rt rhomboids Electrical stimulation performed: No Treatment response/outcome: Utilized skilled palpation to identify bony landmarks and trigger points.  Able to illicit twitch response and muscle elongation.  Soft tissue mobilization following to further promote tissue elongation.   DATE: 06/19/2023 UBE level 1.0 x2 min each direction with PT present to discuss status Provided information on Curable app and provided education regarding neuroscience of pain Info provided for MELT method and OPTP foam roller soft Trigger Point Dry-Needling  Treatment instructions: Expect mild to moderate muscle soreness. S/S of pneumothorax if dry needled over a lung field, and to seek immediate medical attention should they occur. Patient verbalized understanding of these instructions and education. Patient Consent Given: Yes Education handout provided: Previously provided Muscles treated: Bil upper traps, bil cervical multifidi, thoracic multifidi T1-7, Lt rhomboids, bil suboccipitals Electrical stimulation performed: No Treatment response/outcome: Utilized skilled palpation to identify bony landmarks and trigger points.  Able to illicit twitch response and muscle elongation.  Soft tissue mobilization following to further promote tissue elongation.    DATE: 06/13/2023 UBE level 1.0 x2 min each direction with PT present to discuss status Supine  MELT method with soft foam roll:  x20 with cervical rotation, x20 with head  nod Supine with soft foam roll horizontal across thoracic spine x30 sec Supine with soft foam roll vertical down spine:  horizontal shoulder abduction with red tband 2x10 Supine with soft foam roll vertical down spine: shoulder ER with red tband 2x10 Standing shoulder D2 with red tband 2x10 bilat Standing lat pulldown 25# x20 Trigger Point Dry-Needling  Treatment instructions: Expect mild to moderate muscle soreness. S/S of pneumothorax if dry needled over a lung field, and to seek immediate medical attention should they occur. Patient verbalized understanding of these instructions and education. Patient Consent Given: Yes Education handout provided: Previously provided Muscles treated: bilat lumbar multifidi Electrical stimulation performed: No Parameters: N/A Treatment response/outcome: Utilized skilled palpation to identify bony landmarks and trigger points.  Able to illicit twitch response and muscle elongation.  Soft tissue mobilization following to further promote tissue elongation.       PATIENT EDUCATION:  Education details: Initiated HEP Person educated: Patient Education method: Programmer, multimedia, Facilities manager, Verbal cues, and Handouts Education comprehension: verbalized understanding, returned demonstration, and verbal cues required  HOME EXERCISE PROGRAM: Access Code: [email or web address] URL: [email or web address] Date: 05/30/2023 Prepared by: Mikey Kirschner  Exercises - Seated Scapular Retraction  - 1 x daily - 7 x weekly - 3 sets - 10 reps - Seated Cervical Retraction  - 1 x daily - 7 x weekly - 1 sets - 5 reps - Seated Cervical Rotation AROM  - 1 x daily - 7 x weekly - 1 sets - 5 reps - Seated Cervical Sidebending AROM  - 1 x daily - 7 x weekly - 1 sets - 5 reps - Shoulder extension with resistance - Neutral  - 1 x daily - 7 x weekly - 2 sets - 10 reps - Standing Shoulder Row with Anchored Resistance  - 1 x daily - 7 x weekly - 2 sets - 10 reps - Seated Shoulder  External Rotation AAROM with Pulley  - 1 x daily - 7 x weekly - 2 sets - 10 reps - Seated Shoulder Horizontal Abduction with Resistance  - 1 x daily - 7 x weekly - 2 sets - 10 reps  ASSESSMENT:  CLINICAL IMPRESSION; Pt reports Rt sided rib pain after coughing and sneezing.  PT added thoracic mobility exercises to HEP to address.   Patient with good response to neck and thoracic with twitch and improved tissue mobility.  Pt with tenderness over rib on the Rt and at insertion at thoracic spine.   Patient reports feeling less tension in her neck and thoracic region following dry needling.  Patient will benefit from skilled PT to address the below impairments and improve overall function.   OBJECTIVE IMPAIRMENTS: decreased knowledge of condition, decreased ROM, decreased strength, increased fascial restrictions, increased muscle spasms, impaired flexibility, impaired UE functional use, obesity, and pain.   ACTIVITY LIMITATIONS: carrying, lifting, sitting, standing, sleeping, transfers, bed mobility, bathing, dressing, reach over head, and hygiene/grooming  PARTICIPATION LIMITATIONS: meal prep, cleaning, laundry, driving, shopping, community activity, occupation, yard work, school, and church  PERSONAL FACTORS: Fitness, Profession, and 1-2 comorbidities: obesity and insomnia  are also affecting patient's functional outcome.   REHAB POTENTIAL: Good  CLINICAL DECISION MAKING: Stable/uncomplicated  EVALUATION COMPLEXITY: Low   GOALS: Goals reviewed with patient? Yes  SHORT TERM GOALS: Target date: 06/27/2023  Patient to report pain no greater than 4/10  Baseline:  Goal status: Met for cervical pain on 06/13/2023  2.  Patient will be independent with initial HEP  Baseline:  Goal status: MET on 06/13/2023  3.  Frequency of headaches to be reduced to no more than 4 days per week Baseline: less frequent headaches  Goal status: Ongoing    LONG TERM GOALS: Target date: 07/25/2023  Patient  to report pain no greater than 2/10  Baseline:  Goal status: INITIAL  2.  Patient to be independent with advanced HEP  Baseline:  Goal status: INITIAL  3.  Headache frequency to be no more than 1 per week Baseline:  Goal status: INITIAL  4.  Patient to report 85% improvement in overall symptoms  Baseline:  Goal status: INITIAL  5.  Patient to be able to sleep through the night  Baseline:  Goal status: INITIAL  6.  Patient to verbalize stress reduction strategies Baseline:  Goal status: INITIAL   PLAN:  PT FREQUENCY: 1-2x/week  PT DURATION: 8 weeks  PLANNED INTERVENTIONS: 97110-Therapeutic exercises, 97530- Therapeutic activity, 97112- Neuromuscular re-education, 97535- Self Care, 81191- Manual therapy, [phone]- Aquatic Therapy, 97014- Electrical stimulation (unattended), 97035- Ultrasound, 56213- Traction (mechanical), [id number]- Ionotophoresis 4mg /ml Dexamethasone, Patient/Family education, Taping, Dry Needling, Joint mobilization, Spinal mobilization, Vestibular training, Visual/preceptual remediation/compensation, Cryotherapy, and Moist heat  PLAN FOR NEXT SESSION: 1x/wk, update HEP as needed, DN next session, review new HEP   Lorrene Reid, PT 07/03/23 11:56 AM   [facility] [address] [place], [place] [address] Phone # [phone] Fax [phone]

## 2023-07-09 ENCOUNTER — Ambulatory Visit: Payer: BC Managed Care – PPO | Admitting: Physical Therapy

## 2023-07-09 ENCOUNTER — Ambulatory Visit: Payer: BC Managed Care – PPO

## 2023-07-09 DIAGNOSIS — M542 Cervicalgia: Secondary | ICD-10-CM

## 2023-07-09 DIAGNOSIS — R293 Abnormal posture: Secondary | ICD-10-CM

## 2023-07-09 DIAGNOSIS — R252 Cramp and spasm: Secondary | ICD-10-CM

## 2023-07-09 DIAGNOSIS — M6281 Muscle weakness (generalized): Secondary | ICD-10-CM

## 2023-07-09 NOTE — Therapy (Signed)
OUTPATIENT PHYSICAL THERAPY TREATMENT NOTE   Patient Name: Pamela Lin MRN: [id number] DOB:1970/11/19, 52 y.o., female Today's Date: 07/09/2023  END OF SESSION:  PT End of Session - 07/09/23 1700     Visit Number 6    Date for PT Re-Evaluation 07/25/23    Authorization Type [employer]    PT Start Time 1617    PT Stop Time 1657    PT Time Calculation (min) 40 min    Activity Tolerance Patient tolerated treatment well    Behavior During Therapy [facility] for tasks assessed/performed                Past Medical History:  Diagnosis Date   Allergy    Migraine    Past Surgical History:  Procedure Laterality Date   ADENOIDECTOMY     CHOLECYSTECTOMY  2009   EYE SURGERY  2006   LASIK   Patient Active Problem List   Diagnosis Date Noted   Psychophysiological insomnia 03/14/2023   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   Pituitary microadenoma (HCC) 12/24/2019   Elevated LDL cholesterol level 01/29/2019   Chronic migraine without aura, with intractable migraine, so stated, with status migrainosus 01/29/2019   History of environmental allergies 10/12/2016   Class 1 obesity due to excess calories without serious comorbidity with body mass index (BMI) of 34.0 to 34.9 in adult 10/12/2016    PCP: Melida Quitter, PA   REFERRING PROVIDER: Anson Fret, MD  REFERRING DIAG:  Diagnosis  M54.2 (ICD-10-CM) - Cervicalgia  M79.18 (ICD-10-CM) - Cervical myofascial pain syndrome  G44.86 (ICD-10-CM) - Cervicogenic headache  M21.70 (ICD-10-CM) - Leg length discrepancy    THERAPY DIAG:  Cervicalgia  Cramp and spasm  Muscle weakness (generalized)  Abnormal posture  Rationale for Evaluation and Treatment: Rehabilitation  ONSET DATE: 05/02/2023  SUBJECTIVE:                                                                                                                                                                                                         SUBJECTIVE  STATEMENT: My rib is feeling better.     Hand dominance: Right  PERTINENT HISTORY:  Chronic headaches  PAIN: 07/09/23:  Are you having pain? Yes: NPRS scale: 1/10 Pain location: head Pain description: aching,constant Aggravating factors: computer work Relieving factors: medication,rest  PRECAUTIONS: None  RED FLAGS: None     WEIGHT BEARING RESTRICTIONS: No  FALLS:  Has patient fallen in last 6 months? No  LIVING ENVIRONMENT: Lives with: lives with their family Lives in: House/apartment   OCCUPATION: Engineer, materials  PLOF: Independent, Independent with basic ADLs, Independent with household mobility without device, Independent with community mobility without device, Independent with homemaking with ambulation, Independent with gait, and Independent with transfers  PATIENT GOALS: She hopes to reduce her pain and to be able to eliminate daily headaches.  She would like to be able to stay at her job until retirement.    NEXT MD VISIT: prn  OBJECTIVE:  Note: Objective measures were completed at Evaluation unless otherwise noted.  DIAGNOSTIC FINDINGS:  na  PATIENT SURVEYS:  Eval:  FOTO 61, predicted 4  COGNITION: Overall cognitive status: Within functional limits for tasks assessed  SENSATION: WFL  POSTURE: rounded shoulders and forward head  PALPATION: Taut bands and trigger points bilateral upper traps, tenderness sub occipitals  CERVICAL ROM:   Active ROM A/PROM (deg) eval A/ROM 07/09/23  Flexion WFL   Extension WFL   Right lateral flexion 27 35  Left lateral flexion 30 35  Right rotation 55 55  Left rotation 45 50   (Blank rows = not tested)  UPPER EXTREMITY ROM:  WFL  UPPER EXTREMITY MMT:  Eval:  Generally 4+/5 with exception of bilateral shoulder ER which was 4/5.   TODAY'S TREATMENT:                                                                                                                              DATE: 07/09/2023    MELT method for neck and education regarding OPTP foam roller. Trigger Point Dry-Needling  Treatment instructions: Expect mild to moderate muscle soreness. S/S of pneumothorax if dry needled over a lung field, and to seek immediate medical attention should they occur. Patient verbalized understanding of these instructions and education. Patient Consent Given: Yes Education handout provided: Previously provided Muscles treated: Bil upper traps, bil cervical multifidi, thoracic multifidi T1-7, Rt rhomboids Electrical stimulation performed: No Treatment response/outcome: Utilized skilled palpation to identify bony landmarks and trigger points.  Able to illicit twitch response and muscle elongation.  Soft tissue mobilization following to further promote tissue elongation.   DATE: 07/03/2023 Thoracic mobility: childs pose, cat cow, open book   Trigger Point Dry-Needling  Treatment instructions: Expect mild to moderate muscle soreness. S/S of pneumothorax if dry needled over a lung field, and to seek immediate medical attention should they occur. Patient verbalized understanding of these instructions and education. Patient Consent Given: Yes Education handout provided: Previously provided Muscles treated: Bil upper traps, bil cervical multifidi, thoracic multifidi T1-7, Rt rhomboids Electrical stimulation performed: No Treatment response/outcome: Utilized skilled palpation to identify bony landmarks and trigger points.  Able to illicit twitch response and muscle elongation.  Soft tissue mobilization following to further promote tissue elongation.   DATE: 06/19/2023 UBE level 1.0 x2 min each direction with PT present to discuss status Provided information on Curable app and provided education regarding neuroscience of pain Info provided for MELT method and OPTP foam roller soft Trigger Point Dry-Needling  Treatment instructions: Expect mild  to moderate muscle soreness. S/S of pneumothorax if dry  needled over a lung field, and to seek immediate medical attention should they occur. Patient verbalized understanding of these instructions and education. Patient Consent Given: Yes Education handout provided: Previously provided Muscles treated: Bil upper traps, bil cervical multifidi, thoracic multifidi T1-7, Lt rhomboids, bil suboccipitals Electrical stimulation performed: No Treatment response/outcome: Utilized skilled palpation to identify bony landmarks and trigger points.  Able to illicit twitch response and muscle elongation.  Soft tissue mobilization following to further promote tissue elongation.     PATIENT EDUCATION:  Education details: Initiated HEP Person educated: Patient Education method: Programmer, multimedia, Facilities manager, Verbal cues, and Handouts Education comprehension: verbalized understanding, returned demonstration, and verbal cues required  HOME EXERCISE PROGRAM: Access Code: [email or web address] URL: [email or web address] Date: 05/30/2023 Prepared by: Mikey Kirschner  Exercises - Seated Scapular Retraction  - 1 x daily - 7 x weekly - 3 sets - 10 reps - Seated Cervical Retraction  - 1 x daily - 7 x weekly - 1 sets - 5 reps - Seated Cervical Rotation AROM  - 1 x daily - 7 x weekly - 1 sets - 5 reps - Seated Cervical Sidebending AROM  - 1 x daily - 7 x weekly - 1 sets - 5 reps - Shoulder extension with resistance - Neutral  - 1 x daily - 7 x weekly - 2 sets - 10 reps - Standing Shoulder Row with Anchored Resistance  - 1 x daily - 7 x weekly - 2 sets - 10 reps - Seated Shoulder External Rotation AAROM with Pulley  - 1 x daily - 7 x weekly - 2 sets - 10 reps - Seated Shoulder Horizontal Abduction with Resistance  - 1 x daily - 7 x weekly - 2 sets - 10 reps  ASSESSMENT:  CLINICAL IMPRESSION; Rib pain has subsided since last session.  Pt reports 25% reduction in frequency and intensity of headaches since the start of care.   Patient with good response to neck and thoracic  with twitch and improved tissue mobility.  Overall, fewer trigger points and less tension in the neck and thoracic spine.   PT gave pt info on MELT method for neck and OPTP foam roll and pt will consider getting one for home.  Patient reports feeling less tension in her neck and thoracic region following dry needling.  Patient will benefit from skilled PT to address the below impairments and improve overall function.   OBJECTIVE IMPAIRMENTS: decreased knowledge of condition, decreased ROM, decreased strength, increased fascial restrictions, increased muscle spasms, impaired flexibility, impaired UE functional use, obesity, and pain.   ACTIVITY LIMITATIONS: carrying, lifting, sitting, standing, sleeping, transfers, bed mobility, bathing, dressing, reach over head, and hygiene/grooming  PARTICIPATION LIMITATIONS: meal prep, cleaning, laundry, driving, shopping, community activity, occupation, yard work, school, and church  PERSONAL FACTORS: Fitness, Profession, and 1-2 comorbidities: obesity and insomnia  are also affecting patient's functional outcome.   REHAB POTENTIAL: Good  CLINICAL DECISION MAKING: Stable/uncomplicated  EVALUATION COMPLEXITY: Low   GOALS: Goals reviewed with patient? Yes  SHORT TERM GOALS: Target date: 06/27/2023  Patient to report pain no greater than 4/10  Baseline:  Goal status: Met for cervical pain on 06/13/2023  2.  Patient will be independent with initial HEP  Baseline:  Goal status: MET on 06/13/2023  3.  Frequency of headaches to be reduced to no more than 4 days per week Baseline: less frequent headaches by 25% (07/09/23) Goal status: Ongoing    LONG TERM  GOALS: Target date: 07/25/2023  Patient to report pain no greater than 2/10  Baseline:  Goal status: INITIAL  2.  Patient to be independent with advanced HEP  Baseline:  Goal status: INITIAL  3.  Headache frequency to be no more than 1 per week Baseline: daily and reduced by 25%  (07/09/23) Goal status: INITIAL  4.  Patient to report 85% improvement in overall symptoms  Baseline:  Goal status: INITIAL  5.  Patient to be able to sleep through the night  Baseline:  Goal status: INITIAL  6.  Patient to verbalize stress reduction strategies Baseline:  Goal status: INITIAL   PLAN:  PT FREQUENCY: 1-2x/week  PT DURATION: 8 weeks  PLANNED INTERVENTIONS: 97110-Therapeutic exercises, 97530- Therapeutic activity, 97112- Neuromuscular re-education, 97535- Self Care, 40981- Manual therapy, [phone]- Aquatic Therapy, 97014- Electrical stimulation (unattended), 97035- Ultrasound, 82956- Traction (mechanical), [id number]- Ionotophoresis 4mg /ml Dexamethasone, Patient/Family education, Taping, Dry Needling, Joint mobilization, Spinal mobilization, Vestibular training, Visual/preceptual remediation/compensation, Cryotherapy, and Moist heat  PLAN FOR NEXT SESSION: 1x/wk, update HEP as needed, DN next session, review new HEP, MELT method   Lorrene Reid, PT 07/09/23 5:00 PM   [facility] [address], [address] [place], [place] [address] Phone # [phone] Fax [phone]

## 2023-07-16 ENCOUNTER — Encounter: Payer: BC Managed Care – PPO | Admitting: Physical Therapy

## 2023-07-19 ENCOUNTER — Encounter: Payer: BC Managed Care – PPO | Admitting: Physical Therapy

## 2023-07-19 ENCOUNTER — Ambulatory Visit: Payer: 59 | Attending: Neurology

## 2023-07-19 DIAGNOSIS — R293 Abnormal posture: Secondary | ICD-10-CM | POA: Diagnosis present

## 2023-07-19 DIAGNOSIS — M542 Cervicalgia: Secondary | ICD-10-CM | POA: Insufficient documentation

## 2023-07-19 DIAGNOSIS — M6281 Muscle weakness (generalized): Secondary | ICD-10-CM | POA: Diagnosis present

## 2023-07-19 DIAGNOSIS — R252 Cramp and spasm: Secondary | ICD-10-CM | POA: Diagnosis present

## 2023-07-19 NOTE — Therapy (Signed)
 OUTPATIENT PHYSICAL THERAPY TREATMENT NOTE   Patient Name: Pamela Lin MRN: [id number] DOB:07-Aug-1970, 53 y.o., female Today's Date: 07/19/2023  END OF SESSION:  PT End of Session - 07/19/23 0811     Visit Number 7    Authorization Type [employer]BS    PT Start Time 0732    PT Stop Time 0813    PT Time Calculation (min) 41 min    Activity Tolerance Patient tolerated treatment well    Behavior During Therapy [facility] for tasks assessed/performed                 Past Medical History:  Diagnosis Date   Allergy     Migraine    Past Surgical History:  Procedure Laterality Date   ADENOIDECTOMY     CHOLECYSTECTOMY  2009   EYE SURGERY  2006   LASIK   Patient Active Problem List   Diagnosis Date Noted   Psychophysiological insomnia 03/14/2023   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   Pituitary microadenoma (HCC) 12/24/2019   Elevated LDL cholesterol level 01/29/2019   Chronic migraine without aura, with intractable migraine, so stated, with status migrainosus 01/29/2019   History of environmental allergies 10/12/2016   Class 1 obesity due to excess calories without serious comorbidity with body mass index (BMI) of 34.0 to 34.9 in adult 10/12/2016    PCP: Wallace Joesph LABOR, PA   REFERRING PROVIDER: Ines Onetha NOVAK, MD  REFERRING DIAG:  Diagnosis  M54.2 (ICD-10-CM) - Cervicalgia  M79.18 (ICD-10-CM) - Cervical myofascial pain syndrome  G44.86 (ICD-10-CM) - Cervicogenic headache  M21.70 (ICD-10-CM) - Leg length discrepancy    THERAPY DIAG:  Cervicalgia  Cramp and spasm  Muscle weakness (generalized)  Abnormal posture  Rationale for Evaluation and Treatment: Rehabilitation  ONSET DATE: 05/02/2023  SUBJECTIVE:                                                                                                                                                                                                         SUBJECTIVE STATEMENT: I got my foam roller and I have  been using it.  My neck feels 90% better and I feel 70% better overall.  Headaches are still there and they are different.   Hand dominance: Right  PERTINENT HISTORY:  Chronic headaches  PAIN: 07/18/22:  Are you having pain? Yes: NPRS scale: 0/10 Pain location: head Pain description: aching,constant Aggravating factors: computer work Relieving factors: medication,rest  PRECAUTIONS: None  RED FLAGS: None     WEIGHT BEARING RESTRICTIONS: No  FALLS:  Has patient fallen in last 6 months?  No  LIVING ENVIRONMENT: Lives with: lives with their family Lives in: House/apartment   OCCUPATION: engineer, materials  PLOF: Independent, Independent with basic ADLs, Independent with household mobility without device, Independent with community mobility without device, Independent with homemaking with ambulation, Independent with gait, and Independent with transfers  PATIENT GOALS: She hopes to reduce her pain and to be able to eliminate daily headaches.  She would like to be able to stay at her job until retirement.    NEXT MD VISIT: prn  OBJECTIVE:  Note: Objective measures were completed at Evaluation unless otherwise noted.  DIAGNOSTIC FINDINGS:  na  PATIENT SURVEYS:  Eval:  FOTO 61, predicted 69 07/19/23: FOTO 76  COGNITION: Overall cognitive status: Within functional limits for tasks assessed  SENSATION: WFL  POSTURE: rounded shoulders and forward head  PALPATION: Taut bands and trigger points bilateral upper traps, tenderness sub occipitals  CERVICAL ROM:   Active ROM A/PROM (deg) eval A/ROM 07/09/23  Flexion WFL   Extension WFL   Right lateral flexion 27 35  Left lateral flexion 30 35  Right rotation 55 55  Left rotation 45 50   (Blank rows = not tested)  UPPER EXTREMITY ROM:  WFL  UPPER EXTREMITY MMT:  Eval:  Generally 4+/5 with exception of bilateral shoulder ER which was 4/5.   TODAY'S TREATMENT:                                                                                                                               DATE: 07/18/2022   Verbal review of ways to use foam roll. Trigger Point Dry-Needling  Treatment instructions: Expect mild to moderate muscle soreness. S/S of pneumothorax if dry needled over a lung field, and to seek immediate medical attention should they occur. Patient verbalized understanding of these instructions and education. Patient Consent Given: Yes Education handout provided: Previously provided Muscles treated: Bil upper traps, bil cervical multifidi, thoracic multifidi T1-7, Rt rhomboids Electrical stimulation performed: No Treatment response/outcome: Utilized skilled palpation to identify bony landmarks and trigger points.  Able to illicit twitch response and muscle elongation.  Soft tissue mobilization following to further promote tissue elongation.  DATE: 07/09/2023   MELT method for neck and education regarding OPTP foam roller. Trigger Point Dry-Needling  Treatment instructions: Expect mild to moderate muscle soreness. S/S of pneumothorax if dry needled over a lung field, and to seek immediate medical attention should they occur. Patient verbalized understanding of these instructions and education. Patient Consent Given: Yes Education handout provided: Previously provided Muscles treated: Bil upper traps, bil cervical multifidi, thoracic multifidi T1-7, Rt rhomboids Electrical stimulation performed: No Treatment response/outcome: Utilized skilled palpation to identify bony landmarks and trigger points.  Able to illicit twitch response and muscle elongation.  Soft tissue mobilization following to further promote tissue elongation.   DATE: 07/03/2023 Thoracic mobility: childs pose, cat cow, open book   Trigger Point Dry-Needling  Treatment instructions: Expect mild to moderate muscle soreness.  S/S of pneumothorax if dry needled over a lung field, and to seek immediate medical attention should they occur.  Patient verbalized understanding of these instructions and education. Patient Consent Given: Yes Education handout provided: Previously provided Muscles treated: Bil upper traps, bil cervical multifidi, thoracic multifidi T1-7, Rt rhomboids Electrical stimulation performed: No Treatment response/outcome: Utilized skilled palpation to identify bony landmarks and trigger points.  Able to illicit twitch response and muscle elongation.  Soft tissue mobilization following to further promote tissue elongation.      PATIENT EDUCATION:  Education details: Initiated HEP Person educated: Patient Education method: Programmer, Multimedia, Facilities Manager, Verbal cues, and Handouts Education comprehension: verbalized understanding, returned demonstration, and verbal cues required  HOME EXERCISE PROGRAM: Access Code: [email or web address] URL: [email or web address] Date: 05/30/2023 Prepared by: Delon Haddock  Exercises - Seated Scapular Retraction  - 1 x daily - 7 x weekly - 3 sets - 10 reps - Seated Cervical Retraction  - 1 x daily - 7 x weekly - 1 sets - 5 reps - Seated Cervical Rotation AROM  - 1 x daily - 7 x weekly - 1 sets - 5 reps - Seated Cervical Sidebending AROM  - 1 x daily - 7 x weekly - 1 sets - 5 reps - Shoulder extension with resistance - Neutral  - 1 x daily - 7 x weekly - 2 sets - 10 reps - Standing Shoulder Row with Anchored Resistance  - 1 x daily - 7 x weekly - 2 sets - 10 reps - Seated Shoulder External Rotation AAROM with Pulley  - 1 x daily - 7 x weekly - 2 sets - 10 reps - Seated Shoulder Horizontal Abduction with Resistance  - 1 x daily - 7 x weekly - 2 sets - 10 reps  ASSESSMENT:  CLINICAL IMPRESSION; Pt reports 70-90% overall improvement in symptoms since the start of care.   Patient with good response to neck and thoracic with twitch and improved tissue mobility.  Overall, fewer trigger points and less tension in the neck and thoracic spine.   Pt is sleeping without interruption  and is doing all HEP and using foam roll for tissue mobility.  Pt will D/C today.   OBJECTIVE IMPAIRMENTS: decreased knowledge of condition, decreased ROM, decreased strength, increased fascial restrictions, increased muscle spasms, impaired flexibility, impaired UE functional use, obesity, and pain.   ACTIVITY LIMITATIONS: carrying, lifting, sitting, standing, sleeping, transfers, bed mobility, bathing, dressing, reach over head, and hygiene/grooming  PARTICIPATION LIMITATIONS: meal prep, cleaning, laundry, driving, shopping, community activity, occupation, yard work, school, and church  PERSONAL FACTORS: Fitness, Profession, and 1-2 comorbidities: obesity and insomnia  are also affecting patient's functional outcome.   REHAB POTENTIAL: Good  CLINICAL DECISION MAKING: Stable/uncomplicated  EVALUATION COMPLEXITY: Low   GOALS: Goals reviewed with patient? Yes  SHORT TERM GOALS: Target date: 06/27/2023  Patient to report pain no greater than 4/10  Baseline:  Goal status: Met for cervical pain on 06/13/2023  2.  Patient will be independent with initial HEP  Baseline:  Goal status: MET on 06/13/2023  3.  Frequency of headaches to be reduced to no more than 4 days per week Baseline: less frequent and feel different now (07/19/23) Goal status partially     LONG TERM GOALS: Target date: 07/25/2023  Patient to report pain no greater than 2/10  Baseline: neck pain 0/10, headaches vary Goal status: partially met  2.  Patient to be independent with advanced HEP  Baseline:  Goal status: MET  3.  Headache  frequency to be no more than 1 per week Baseline: daily and reduced by 25% (07/09/23) Goal status: INITIAL  4.  Patient to report 85% improvement in overall symptoms  Baseline: 70-90% (07/19/23) Goal status: MET  5.  Patient to be able to sleep through the night  Baseline:  Goal status: MET  6.  Patient to verbalize stress reduction strategies Baseline: using foam roll,  stretching and decompression (07/19/23) Goal status: MET   PLAN:  PHYSICAL THERAPY DISCHARGE SUMMARY  Visits from Start of Care: 7  Current functional level related to goals / functional outcomes: See above.  Pt will continue with pain management and HEP.    Remaining deficits: Continued chronic headaches.  70% overall improvement in symptoms    Education / Equipment: HEP, posture, stress management   Patient agrees to discharge. Patient goals were partially met. Patient is being discharged due to being pleased with the current functional level.    Burnard Joy, PT 07/19/23 8:25 AM   [facility] [address] [place], [place] [address] Phone # [phone] Fax [phone]

## 2023-07-23 ENCOUNTER — Other Ambulatory Visit: Payer: Self-pay | Admitting: Family Medicine

## 2023-07-23 DIAGNOSIS — S76212A Strain of adductor muscle, fascia and tendon of left thigh, initial encounter: Secondary | ICD-10-CM

## 2023-07-23 DIAGNOSIS — M6289 Other specified disorders of muscle: Secondary | ICD-10-CM

## 2023-08-16 ENCOUNTER — Encounter: Payer: Self-pay | Admitting: Family Medicine

## 2023-09-04 ENCOUNTER — Telehealth: Payer: 59 | Admitting: Neurology

## 2023-09-04 NOTE — Telephone Encounter (Signed)
 Faxed PA form and OV notes to pt's new [employer] @ [phone].

## 2023-09-11 NOTE — Telephone Encounter (Signed)
 Called [employer] to check auth status, spoke with Clifton Custard. He states Berkley Harvey has been approved, reference # for call is [id number]. Pt will continue to be buy/bill.  Auth#: [id number] (07/11/23-05/14/24)

## 2023-10-01 NOTE — Progress Notes (Unsigned)
 10/01/23: reports that she is going weeks without a severe headache- maybe 1/once.  Remains on Qulipta daily rarely does she get an aura but it has happened before. She was educated about risk of stroke with estrogen in the setting of migraine with aura. 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  We will check prolactin level and repeat MRI of the brain with and without contrast   07/02/23: She states that her headaches have been manageable.  She still has a daily dull headache.  But only has approximately 3 migraines a month.  04/04/23: reports that she couldn't get Qulipta for several weeks. During that time she was taking OTC medication. Headache frequency increased during that time.Advised that if she runs our of Qulipta again- we can provide her with Samples.  She has a daily headache typically however she states that she gets busy she may not even notice it.  She does feel that the medicine helps with the severity of her headaches.  She saw commercial for Vyepti.  Curious if that would be helpful.  We will get her an office visit with Dr. Lucia Gaskins to discuss  01/08/23: Still having daily headaches.  Remains on Qulipta.  Sleep consult was negative for sleep apnea  09/26/22:having daily headaches for the 2.5 months. Can function but has a headache daily. Usually wakes up with the headache. Not sure how much nurtec works. Hasn't taken maxalt unless its a severe headache. Headache located across the forehead and behind the right ear.   Patients reports that she has never had a sleep test. She does snore. Feels tired throughout the day.  Tried and failed medication: Nortriptyline, Trokendi, sertraline, Ajovy, tizanidine, Nurtec.   06/29/22: Botox continues to work well.   03/20/22: Migraines still under good control.   12/26/21: Botox working well. Didn't really have many headaches until she was getting due for botox.  09/21/21: Headaches have worsened. Daily headaches  on the pain scale its 3-4. Takes nurtec 75 mg every other day.  She does report that she has allergies.  Reports that when she would palpate above her eyes it was sore over the last week.  She is congested today.  But states that she is not like this every /day.  She does not take any daily medication for allergies.  Patient was encouraged to follow-up with her PCP as her sinuses could be contributing to daily headaches  06/29/21: Botox is helping. Hx of pituitary adenoma.  We will recheck prolactin and TSH today.  We will repeat MRI of the brain.  BOTOX PROCEDURE NOTE FOR MIGRAINE HEADACHE    Contraindications and precautions discussed with patient(above). Aseptic procedure was observed and patient tolerated procedure. Procedure performed by Butch Penny, NP  The condition has existed for more than 6 months, and pt does not have a diagnosis of ALS, Myasthenia Gravis or Lambert-Eaton Syndrome.  Risks and benefits of injections discussed and pt agrees to proceed with the procedure.  Written consent obtained   Indication/Diagnosis: chronic migraine BOTOX(J0585) injection was performed according to protocol by [employer]. 200 units of BOTOX was dissolved into 4 cc NS.   NDC: [address]-52   Botox- 200 units x 1 vial Lot: [id number] Expiration: 10/2025 NDC: 0102-7253-66   Bacteriostatic 0.9% Sodium Chloride- * mL  Lot: [id number] Expiration: 05/10/2024 NDC: 4259-5638-75   Dx: I43.329       Description of procedure:  The patient was placed in a sitting position. The standard protocol  was used for Botox as follows, with 5 units of Botox injected at each site:   -Procerus muscle, midline injection  -Corrugator muscle, bilateral injection  -Frontalis muscle, bilateral injection, with 2 sites each side, medial injection was performed in the upper one third of the frontalis muscle, in the region vertical from the medial inferior edge of the superior orbital rim. The lateral injection was again  in the upper one third of the forehead vertically above the lateral limbus of the cornea, 1.5 cm lateral to the medial injection site.  -Temporalis muscle injection, 4 sites, bilaterally. The first injection was 3 cm above the tragus of the ear, second injection site was 1.5 cm to 3 cm up from the first injection site in line with the tragus of the ear. The third injection site was 1.5-3 cm forward between the first 2 injection sites. The fourth injection site was 1.5 cm posterior to the second injection site.  -Occipitalis muscle injection, 3 sites, bilaterally. The first injection was done one half way between the occipital protuberance and the tip of the mastoid process behind the ear. The second injection site was done lateral and superior to the first, 1 fingerbreadth from the first injection. The third injection site was 1 fingerbreadth superiorly and medially from the first injection site.  -Cervical paraspinal muscle injection, 2 sites, bilateral knee first injection site was 1 cm from the midline of the cervical spine, 3 cm inferior to the lower border of the occipital protuberance. The second injection site was 1.5 cm superiorly and laterally to the first injection site.  -Trapezius muscle injection was performed at 3 sites, bilaterally. The first injection site was in the upper trapezius muscle halfway between the inflection point of the neck, and the acromion. The second injection site was one half way between the acromion and the first injection site. The third injection was done between the first injection site and the inflection point of the neck.   Will return for repeat injection in 3 months.   A 200 unit sof Botox was used, 155 units were injected, the rest of the Botox was wasted. The patient tolerated the procedure well, there were no complications of the above procedure.  Butch Penny, MSN, NP-C 10/01/2023, 1:28 PM [facility] [address], [address] [place], [place] [address] [phone]

## 2023-10-02 ENCOUNTER — Ambulatory Visit: Payer: BC Managed Care – PPO | Admitting: Adult Health

## 2023-10-02 DIAGNOSIS — D352 Benign neoplasm of pituitary gland: Secondary | ICD-10-CM

## 2023-10-02 DIAGNOSIS — G43709 Chronic migraine without aura, not intractable, without status migrainosus: Secondary | ICD-10-CM | POA: Diagnosis not present

## 2023-10-02 MED ORDER — ONABOTULINUMTOXINA 200 UNITS IJ SOLR
155.0000 [IU] | Freq: Once | INTRAMUSCULAR | Status: AC
  Administered 2023-10-02: 155 [IU] via INTRAMUSCULAR

## 2023-10-02 NOTE — Progress Notes (Signed)
 Botox- 200 units x 1 vial Lot: [id number] Expiration: 10/2025 NDC: 4540-9811-91  Bacteriostatic 0.9% Sodium Chloride- * mL  Lot: [id number] Expiration: 05/10/2024 NDC: 6213-0865-78  Dx: I69.629 B/B Witnessed by Hermenia Fiscal, RN

## 2023-10-03 ENCOUNTER — Ambulatory Visit: Payer: BC Managed Care – PPO | Admitting: Family Medicine

## 2023-10-03 ENCOUNTER — Encounter: Payer: Self-pay | Admitting: Adult Health

## 2023-10-03 LAB — PROLACTIN: Prolactin: 7.3 ng/mL (ref 3.6–25.2)

## 2023-10-22 ENCOUNTER — Telehealth: Payer: Self-pay | Admitting: Adult Health

## 2023-10-22 NOTE — Telephone Encounter (Signed)
 We left her a voice mail on 4/3 and 4/14 to schedule her MRI.

## 2023-10-24 ENCOUNTER — Other Ambulatory Visit: Payer: Self-pay | Admitting: Family Medicine

## 2023-10-24 DIAGNOSIS — F5104 Psychophysiologic insomnia: Secondary | ICD-10-CM

## 2023-11-06 ENCOUNTER — Encounter: Payer: Self-pay | Admitting: Adult Health

## 2023-11-06 ENCOUNTER — Ambulatory Visit

## 2023-11-06 DIAGNOSIS — D352 Benign neoplasm of pituitary gland: Secondary | ICD-10-CM

## 2023-11-06 MED ORDER — GADOBENATE DIMEGLUMINE 529 MG/ML IV SOLN
10.0000 mL | Freq: Once | INTRAVENOUS | Status: AC | PRN
  Administered 2023-11-06: 10 mL via INTRAVENOUS

## 2023-11-07 ENCOUNTER — Ambulatory Visit: Admission: EM | Admit: 2023-11-07 | Discharge: 2023-11-07 | Disposition: A | Discharge status: Home / Self Care

## 2023-11-07 ENCOUNTER — Ambulatory Visit: Payer: Self-pay | Admitting: *Deleted

## 2023-11-07 ENCOUNTER — Encounter: Payer: Self-pay | Admitting: Emergency Medicine

## 2023-11-07 DIAGNOSIS — B349 Viral infection, unspecified: Secondary | ICD-10-CM | POA: Insufficient documentation

## 2023-11-07 DIAGNOSIS — R509 Fever, unspecified: Secondary | ICD-10-CM | POA: Insufficient documentation

## 2023-11-07 DIAGNOSIS — J029 Acute pharyngitis, unspecified: Secondary | ICD-10-CM | POA: Diagnosis present

## 2023-11-07 LAB — POC SARS CORONAVIRUS 2 AG -  ED: SARS Coronavirus 2 Ag: NEGATIVE

## 2023-11-07 LAB — POCT RAPID STREP A (OFFICE): Rapid Strep A Screen: NEGATIVE

## 2023-11-07 LAB — POCT INFLUENZA A/B
Influenza A, POC: NEGATIVE
Influenza B, POC: NEGATIVE

## 2023-11-07 NOTE — Discharge Instructions (Signed)

## 2023-11-07 NOTE — Telephone Encounter (Signed)
  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 No appointment available- UC advised    Copied from CRM [id number]. Topic: Clinical - Red Word Triage >> Nov 07, 2023  8:03 AM Rosamond Comes wrote: Red Word that prompted transfer to Nurse Triage: patient calling in. Patient deals with headaches everyday goes to Neurologist. Fever started 11/06/23 evening tempeture 100.2 this morning 100.1 headache is more intense across forehead, eyes are burning, sore throat hard to swallow, sleeping more the usual. Reason for Disposition  [1] Sore throat is the only symptom AND [2] sore throat present < 48 hours  Answer Assessment - Initial Assessment Questions 1. ONSET: "When did the throat start hurting?" (Hours or days ago)      Midday yesterday 2. SEVERITY: "How bad is the sore throat?" (Scale 1-10; mild, moderate or severe)   - MILD (1-3):  Doesn't interfere with eating or normal activities.   - MODERATE (4-7): Interferes with eating some solids and normal activities.   - SEVERE (8-10):  Excruciating pain, interferes with most normal activities.   - SEVERE WITH DYSPHAGIA (10): Can't swallow liquids, drooling.     Mild/moderate 3. STREP EXPOSURE: "Has there been any exposure to strep within the past week?" If Yes, ask: "What type of contact occurred?"      unknown 4.  VIRAL SYMPTOMS: "Are there any symptoms of a cold, such as a runny nose, cough, hoarse voice or red eyes?"      Some congestion, eye burning 5. FEVER: "Do you have a fever?" If Yes, ask: "What is your temperature, how was it measured, and when did it start?"     100.1 6. PUS ON THE  TONSILS: "Is there pus on the tonsils in the back of your throat?"     no 7. OTHER SYMPTOMS: "Do you have any other symptoms?" (e.g., difficulty breathing, headache, rash)     Headache, fever, eyes burning  Protocols used: Sore Throat-A-AH

## 2023-11-07 NOTE — ED Provider Notes (Signed)
 [name]    CSN: [id number] Arrival date & time: 11/07/23  0901      History   Chief Complaint Chief Complaint  Patient presents with   Sore Throat   Fever    HPI Pamela Lin is a 53 y.o. female.   Pamela Lin is a 53 y.o. female presenting for chief complaint of fever, chills, sore throat, generalized fatigue, and runny nose that started yesterday.  Symptoms began with fever and chills, then she developed a runny nose and sore throat overnight.  Sore throat is worsened by swallowing.  Denies difficulty maintaining secretions, rash, dizziness, cough, headache, ear pain, and known recent sick contacts with similar symptoms.  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  Never smoker, denies history of chronic respiratory problems.  No nausea, vomiting, diarrhea, or abdominal pain.  Max temp at home 100.2.  Currently afebrile after taking Aleve.   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      Home Medications    Prior to Admission medications   Medication Sig Start Date End Date Taking? Authorizing Provider  Atogepant  (QULIPTA ) 60 MG TABS Take 1 tablet (60 mg total) by  mouth daily. 03/14/23  Yes Millikan, Megan, NP  atorvastatin  (LIPITOR) 10 MG tablet TAKE 1 TABLET BY MOUTH TWICE PER WEEK. 03/14/23  Yes Maryclare Smoke A, PA  Pseudoephedrine-Naproxen Na (ALEVE-D SINUS & COLD PO) Take by mouth.   Yes [provider]  traZODone  (DESYREL ) 50 MG tablet TAKE 1 TABLET BY MOUTH AT BEDTIME. MAY INCREASE TO 2 TABLETS AFTER 1 TO 2 WEEKS IF NEEDED. 10/25/23  Yes Laneta Pintos, MD  amoxicillin -clavulanate (AUGMENTIN ) 875-125 MG tablet SMARTSIG:1 Tablet(s) By Mouth Every 12 Hours Patient not taking: Reported on 11/07/2023 09/10/23   [provider]  azelastine  (ASTELIN ) 0.1 % nasal spray Place 2 sprays into both nostrils as needed for rhinitis. Use in each nostril as directed Patient not taking: Reported on 11/07/2023 06/06/23   Maryclare Smoke A, PA  Botulinum Toxin Type A  (BOTOX ) 200 units SOLR Provider to inject 155 units into the muscles of the head and neck every 12 weeks. Discard remainder. 10/18/21   Glory Larsen, MD  cyclobenzaprine  (FLEXERIL ) 5 MG tablet TAKE 1 TABLET BY MOUTH 3 TIMES DAILY AS NEEDED FOR MUSCLE SPASMS. Patient not taking: Reported on 11/07/2023 07/23/23   Maryclare Smoke A, PA  fluticasone  (FLONASE ) 50 MCG/ACT nasal spray Place 2 sprays into both nostrils as needed for allergies or rhinitis. Patient not taking: Reported on 11/07/2023 06/06/23   Noreene Bearded, PA    Family History Family History  Problem Relation Age  of Onset   Cancer Mother        breast   Breast cancer Mother    Headache Mother        had them for a long time, less around 2-35 years old    Prostate cancer Father    CAD Father        bypass surgery   Headache Father    Cancer Father    Hearing loss Father    Heart disease Father    Cancer Maternal Aunt        breast   Breast cancer Maternal Aunt    Cancer Paternal Aunt        breast   Breast cancer Paternal Aunt    Migraines Brother     Social History Social History   Tobacco Use   Smoking  status: Never    Passive exposure: Never   Smokeless tobacco: Never   Tobacco comments:    tried a few cigarettes in college   Vaping Use   Vaping status: Never Used  Substance Use Topics   Alcohol use: Not Currently    Comment: 2 drinks per month   Drug use: No     Allergies   Dust mite extract, Grass extracts [gramineae pollens], and Other   Review of Systems Review of Systems  Constitutional:  Positive for fever.  Per HPI   Physical Exam Triage Vital Signs ED Triage Vitals  Encounter Vitals Group     BP 11/07/23 0915 130/85     Systolic BP Percentile --      Diastolic BP Percentile --      Pulse Rate 11/07/23 0915 (!) 107     Resp 11/07/23 0915 14     Temp 11/07/23 0915 98.6 F (37 C)     Temp Source 11/07/23 0915 Oral     SpO2 11/07/23 0915 96 %     Weight --      Height --      Head Circumference --      Peak Flow --      Pain Score 11/07/23 0916 5     Pain Loc --      Pain Education --      Exclude from Growth Chart --    No data found.  Updated Vital Signs BP 130/85 (BP Location: Left Arm)   Pulse (!) 107   Temp 98.6 F (37 C) (Oral)   Resp 14   SpO2 96%   Visual Acuity Right Eye Distance:   Left Eye Distance:   Bilateral Distance:    Right Eye Near:   Left Eye Near:    Bilateral Near:     Physical Exam Vitals and nursing note reviewed.  Constitutional:      Appearance: She is not ill-appearing or toxic-appearing.  HENT:     Head: Normocephalic and atraumatic.     Right Ear: Hearing, tympanic membrane, ear canal and external ear normal.     Left Ear: Hearing, tympanic membrane, ear canal and external ear normal.     Nose: Rhinorrhea present.     Mouth/Throat:     Lips: Pink.     Mouth: Mucous membranes are moist. No injury or oral lesions.     Dentition: Normal dentition.     Tongue: No lesions.     Pharynx: Oropharynx is clear. Uvula midline. Posterior oropharyngeal erythema present. No pharyngeal swelling, oropharyngeal exudate,  uvula swelling or postnasal drip.     Tonsils: No tonsillar exudate.  Comments: No trismus, phonation normal, maintaining secretions without difficulty.   Eyes:     General: Lids are normal. Vision grossly intact. Gaze aligned appropriately.     Extraocular Movements: Extraocular movements intact.     Conjunctiva/sclera: Conjunctivae normal.  Neck:     Trachea: Trachea and phonation normal.  Cardiovascular:     Rate and Rhythm: Normal rate and regular rhythm.     Heart sounds: Normal heart sounds, S1 normal and S2 normal.  Pulmonary:     Effort: Pulmonary effort is normal. No respiratory distress.     Breath sounds: Normal breath sounds and air entry. No wheezing, rhonchi or rales.  Chest:     Chest wall: No tenderness.  Musculoskeletal:     Cervical back: Neck supple.  Lymphadenopathy:     Cervical: No cervical adenopathy.  Skin:    General: Skin is warm and dry.     Capillary Refill: Capillary refill takes less than 2 seconds.     Findings: No rash.  Neurological:     General: No focal deficit present.     Mental Status: She is alert and oriented to person, place, and time. Mental status is at baseline.     Cranial Nerves: No dysarthria or facial asymmetry.  Psychiatric:        Mood and Affect: Mood normal.        Speech: Speech normal.        Behavior: Behavior normal.        Thought Content: Thought content normal.        Judgment: Judgment normal.      UC Treatments / Results  Labs (all labs ordered are listed, but only abnormal results are displayed) Labs Reviewed  POCT RAPID STREP A (OFFICE) - Normal  POC SARS CORONAVIRUS 2 AG -  ED - Normal  POCT INFLUENZA A/B    EKG   Radiology   Procedures Procedures (including critical care time)  Medications Ordered in UC Medications - No data to display  Initial Impression / Assessment and Plan / UC Course  I have reviewed the triage vital signs and the nursing notes.  Pertinent labs & imaging results  that were available during my care of the patient were reviewed by me and considered in my medical decision making (see chart for details).   1. Viral syndrome, fever Suspect viral URI, viral syndrome.  Strep/viral testing: POC COVID and flu negative, POC strep negative, throat culture pending.   Physical exam findings reassuring, vital signs hemodynamically stable, and lungs clear, therefore deferred imaging of the chest.  Advised supportive care/prescriptions for symptomatic relief as outlined in AVS.    Counseled patient on potential for adverse effects with medications prescribed/recommended today, strict ER and return-to-clinic precautions discussed, patient verbalized understanding.    Final Clinical Impressions(s) / UC Diagnoses   Final diagnoses:  Viral syndrome  Fever, unspecified     Discharge Instructions      You have a viral illness which will improve on its own with rest, fluids, and medications to help with your symptoms.  Tylenol, guaifenesin (plain mucinex), and saline nasal sprays may help relieve symptoms.   Two teaspoons of honey in 1 cup of warm water every 4-6 hours may help with throat pains.  Humidifier in room at nighttime may help soothe cough (clean well daily).   For chest pain, shortness of breath, inability to keep food or fluids down without vomiting, fever that does not respond to tylenol or motrin, or  any other severe symptoms, please go to the ER for further evaluation. Return to urgent care as needed, otherwise follow-up with PCP.       ED Prescriptions   None    PDMP not reviewed this encounter.   Shella Devoid [place], [place] 11/07/23 [phone]

## 2023-11-07 NOTE — ED Triage Notes (Signed)
 Pt reports fever and chills that started yesterday. Woke up with sore throat and slight nasal congestion. Max temp: 100.3 yesterday afternoon. 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 Aleve-d with some relief.

## 2023-11-10 LAB — CULTURE, GROUP A STREP (THRC)

## 2023-11-27 ENCOUNTER — Other Ambulatory Visit (HOSPITAL_COMMUNITY): Payer: Self-pay

## 2023-11-27 ENCOUNTER — Other Ambulatory Visit: Payer: Self-pay

## 2023-11-27 MED ORDER — BOTOX 200 UNITS IJ SOLR
INTRAMUSCULAR | 3 refills | Status: AC
  Filled 2023-11-27: qty 1, fill #0
  Filled 2023-12-14: qty 1, 84d supply, fill #0
  Filled 2024-03-17: qty 1, 84d supply, fill #1
  Filled 2024-06-17: qty 1, 84d supply, fill #2

## 2023-11-27 NOTE — Addendum Note (Signed)
 Addended by: Burns Carwin on: 11/27/2023 01:54 PM   Modules accepted: Orders

## 2023-11-27 NOTE — Telephone Encounter (Addendum)
 Received additional approval from pt's PBM, [facility] [employer]. Please send rx to [facility] so she can now be SP, thank you.  Auth#: [id number] (11/27/23-11/26/24)

## 2023-11-27 NOTE — Telephone Encounter (Signed)
Botox 200 unit Rx sent to [facility].

## 2023-11-28 ENCOUNTER — Other Ambulatory Visit (HOSPITAL_COMMUNITY): Payer: Self-pay

## 2023-11-28 ENCOUNTER — Other Ambulatory Visit: Payer: Self-pay

## 2023-12-12 ENCOUNTER — Other Ambulatory Visit (HOSPITAL_COMMUNITY): Payer: Self-pay

## 2023-12-14 ENCOUNTER — Other Ambulatory Visit: Payer: Self-pay

## 2023-12-14 ENCOUNTER — Other Ambulatory Visit (HOSPITAL_COMMUNITY): Payer: Self-pay

## 2023-12-14 NOTE — Progress Notes (Signed)
 Specialty Pharmacy Initial Fill Coordination Note  Pamela Lin is a 53 y.o. female contacted today regarding initial fill of specialty medication(s) OnabotulinumtoxinA  (Botox )   Patient requested Courier to Provider Office   Delivery date: 12/19/23   Verified address: [facility], [address], [place], [place] [address]   Medication will be filled on 12/17/2023.   Patient is aware of 0 copayment.

## 2023-12-18 ENCOUNTER — Other Ambulatory Visit: Payer: Self-pay

## 2023-12-26 ENCOUNTER — Ambulatory Visit: Admitting: Adult Health

## 2023-12-26 VITALS — BP 138/75 | HR 66

## 2023-12-26 DIAGNOSIS — G43709 Chronic migraine without aura, not intractable, without status migrainosus: Secondary | ICD-10-CM | POA: Diagnosis not present

## 2023-12-26 MED ORDER — ONABOTULINUMTOXINA 200 UNITS IJ SOLR
155.0000 [IU] | Freq: Once | INTRAMUSCULAR | Status: AC
Start: 2023-12-26 — End: 2023-12-26
  Administered 2023-12-26: 155 [IU] via INTRAMUSCULAR

## 2023-12-26 NOTE — Progress Notes (Signed)
 Botox - 200 units x 1 vial Lot: [id number] Expiration: 04/2026 NDC: 8469-6295-28  Bacteriostatic 0.9% Sodium Chloride- 4 mL  Lot: [id number] Expiration: 12/2024 NDC: 0102-7253-66  Dx: Y40.347 S/P  Witnessed QQ:[id number],FI

## 2023-12-26 NOTE — Progress Notes (Signed)
 12/26/23: Overall she reports that she is doing well.  She gets a mild headache and is daily but it does not typically last all day long.  Only gets a severe migraine every 3 weeks.  She remains on Qulipta .  When she does get a migraine she typically will take Aleve-D. She saw a chiropractor and feels that helps.  Has tried triptans in the past.  Returns today for Botox  injections  10/01/23: reports that she is going weeks without a severe headache- maybe 1/once.  Remains on Qulipta  daily rarely does she get an aura but it has happened before. She was educated about risk of stroke with estrogen in the setting of migraine with aura. 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  We will check prolactin level and repeat MRI of the brain with and without contrast   07/02/23: She states that her headaches have been manageable.  She still has a daily dull headache.  But only has approximately 3 migraines a month.  04/04/23: reports that she couldn't get Qulipta  for several weeks. During that time she was taking OTC medication. Headache frequency increased during that time.Advised that if she runs our of Qulipta  again- we can provide her with Samples.  She has a daily headache typically however she states that she gets busy she may not even notice it.  She does feel that the medicine helps with the severity of her headaches.  She saw commercial for Vyepti.  Curious if that would be helpful.  We will get her an office visit with Dr. Tresia Fruit to discuss  01/08/23: Still having daily headaches.  Remains on Qulipta .  Sleep consult was negative for sleep apnea  09/26/22:having daily headaches for the 2.5 months. Can function but has a headache daily. Usually wakes up with the headache. Not sure how much nurtec works. Hasn't taken maxalt  unless its a severe headache. Headache located across the forehead and behind the right ear.   Patients reports that she has never had a sleep test. She does  snore. Feels tired throughout the day.  Tried and failed medication: Nortriptyline , Trokendi , sertraline , Ajovy , tizanidine , Nurtec.   06/29/22: Botox  continues to work well.   03/20/22: Migraines still under good control.   12/26/21: Botox  working well. Didn't really have many headaches until she was getting due for botox .  09/21/21: Headaches have worsened. Daily headaches on the pain scale its 3-4. Takes nurtec 75 mg every other day.  She does report that she has allergies.  Reports that when she would palpate above her eyes it was sore over the last week.  She is congested today.  But states that she is not like this every /day.  She does not take any daily medication for allergies.  Patient was encouraged to follow-up with her PCP as her sinuses could be contributing to daily headaches  06/29/21: Botox  is helping. Hx of pituitary adenoma.  We will recheck prolactin and TSH today.  We will repeat MRI of the brain.  BOTOX  PROCEDURE NOTE FOR MIGRAINE HEADACHE    Contraindications and precautions discussed with patient(above). Aseptic procedure was observed and patient tolerated procedure. Procedure performed by Clem Currier, NP  The condition has existed for more than 6 months, and pt does not have a diagnosis of ALS, Myasthenia Gravis or Lambert-Eaton Syndrome.  Risks and benefits of injections discussed and pt agrees to proceed with the procedure.  Written consent obtained   Indication/Diagnosis: chronic migraine BOTOX (X9147) injection was  performed according to protocol by [employer]. 200 units of BOTOX  was dissolved into 4 cc NS.   NDC: [address]-01   Botox - 200 units x 1 vial Lot: [id number] Expiration: 04/2026 NDC: 4540-9811-91   Bacteriostatic 0.9% Sodium Chloride- 4 mL  Lot: [id number] Expiration: 12/2024 NDC: 6213-0865-78         Description of procedure:  The patient was placed in a sitting position. The standard protocol was used for Botox  as follows, with 5 units of  Botox  injected at each site:   -Procerus muscle, midline injection  -Corrugator muscle, bilateral injection  -Frontalis muscle, bilateral injection, with 2 sites each side, medial injection was performed in the upper one third of the frontalis muscle, in the region vertical from the medial inferior edge of the superior orbital rim. The lateral injection was again in the upper one third of the forehead vertically above the lateral limbus of the cornea, 1.5 cm lateral to the medial injection site.  -Temporalis muscle injection, 4 sites, bilaterally. The first injection was 3 cm above the tragus of the ear, second injection site was 1.5 cm to 3 cm up from the first injection site in line with the tragus of the ear. The third injection site was 1.5-3 cm forward between the first 2 injection sites. The fourth injection site was 1.5 cm posterior to the second injection site.  -Occipitalis muscle injection, 3 sites, bilaterally. The first injection was done one half way between the occipital protuberance and the tip of the mastoid process behind the ear. The second injection site was done lateral and superior to the first, 1 fingerbreadth from the first injection. The third injection site was 1 fingerbreadth superiorly and medially from the first injection site.  -Cervical paraspinal muscle injection, 2 sites, bilateral knee first injection site was 1 cm from the midline of the cervical spine, 3 cm inferior to the lower border of the occipital protuberance. The second injection site was 1.5 cm superiorly and laterally to the first injection site.  -Trapezius muscle injection was performed at 3 sites, bilaterally. The first injection site was in the upper trapezius muscle halfway between the inflection point of the neck, and the acromion. The second injection site was one half way between the acromion and the first injection site. The third injection was done between the first injection site and the  inflection point of the neck.   Will return for repeat injection in 3 months.   A 200 unit sof Botox  was used, 155 units were injected, the rest of the Botox  was wasted. The patient tolerated the procedure well, there were no complications of the above procedure.  Clem Currier, MSN, NP-C 12/26/2023, 9:09 AM [facility] [address] [place], [place] [address] [phone]

## 2024-01-12 ENCOUNTER — Other Ambulatory Visit: Payer: Self-pay | Admitting: Adult Health

## 2024-02-25 ENCOUNTER — Telehealth: Payer: Self-pay | Admitting: Adult Health

## 2024-02-25 NOTE — Telephone Encounter (Signed)
 LVM and sent mychart msg informing pt of appt change - NP schedule change

## 2024-02-26 ENCOUNTER — Other Ambulatory Visit: Payer: Self-pay

## 2024-02-26 NOTE — Telephone Encounter (Signed)
 9/11 at 845

## 2024-02-27 NOTE — Telephone Encounter (Signed)
 9/9 at 845 or 9/19 at 815

## 2024-03-05 ENCOUNTER — Other Ambulatory Visit: Payer: Self-pay

## 2024-03-05 DIAGNOSIS — Z130 Encounter for screening for diseases of the blood and blood-forming organs and certain disorders involving the immune mechanism: Secondary | ICD-10-CM

## 2024-03-05 DIAGNOSIS — E7800 Pure hypercholesterolemia, unspecified: Secondary | ICD-10-CM

## 2024-03-13 ENCOUNTER — Other Ambulatory Visit

## 2024-03-13 DIAGNOSIS — Z130 Encounter for screening for diseases of the blood and blood-forming organs and certain disorders involving the immune mechanism: Secondary | ICD-10-CM

## 2024-03-13 DIAGNOSIS — E7800 Pure hypercholesterolemia, unspecified: Secondary | ICD-10-CM

## 2024-03-14 ENCOUNTER — Ambulatory Visit: Payer: Self-pay

## 2024-03-14 LAB — CBC WITH DIFFERENTIAL/PLATELET
Basophils Absolute: 0 x10E3/uL (ref 0.0–0.2)
Basos: 1 %
EOS (ABSOLUTE): 0.5 x10E3/uL — ABNORMAL HIGH (ref 0.0–0.4)
Eos: 9 %
Hematocrit: 43.1 % (ref 34.0–46.6)
Hemoglobin: 14.4 g/dL (ref 11.1–15.9)
Immature Grans (Abs): 0 x10E3/uL (ref 0.0–0.1)
Immature Granulocytes: 0 %
Lymphocytes Absolute: 1.6 x10E3/uL (ref 0.7–3.1)
Lymphs: 28 %
MCH: 30.4 pg (ref 26.6–33.0)
MCHC: 33.4 g/dL (ref 31.5–35.7)
MCV: 91 fL (ref 79–97)
Monocytes Absolute: 0.4 x10E3/uL (ref 0.1–0.9)
Monocytes: 7 %
Neutrophils Absolute: 3.1 x10E3/uL (ref 1.4–7.0)
Neutrophils: 55 %
Platelets: 196 x10E3/uL (ref 150–450)
RBC: 4.74 x10E6/uL (ref 3.77–5.28)
RDW: 12.4 % (ref 11.7–15.4)
WBC: 5.6 x10E3/uL (ref 3.4–10.8)

## 2024-03-14 LAB — COMPREHENSIVE METABOLIC PANEL WITH GFR
ALT: 21 IU/L (ref 0–32)
AST: 20 IU/L (ref 0–40)
Albumin: 4.5 g/dL (ref 3.8–4.9)
Alkaline Phosphatase: 81 IU/L (ref 44–121)
BUN/Creatinine Ratio: 13 (ref 9–23)
BUN: 9 mg/dL (ref 6–24)
Bilirubin Total: 0.6 mg/dL (ref 0.0–1.2)
CO2: 23 mmol/L (ref 20–29)
Calcium: 9.3 mg/dL (ref 8.7–10.2)
Chloride: 103 mmol/L (ref 96–106)
Creatinine, Ser: 0.7 mg/dL (ref 0.57–1.00)
Globulin, Total: 2.3 g/dL (ref 1.5–4.5)
Glucose: 95 mg/dL (ref 70–99)
Potassium: 4.2 mmol/L (ref 3.5–5.2)
Sodium: 141 mmol/L (ref 134–144)
Total Protein: 6.8 g/dL (ref 6.0–8.5)
eGFR: 102 mL/min/1.73 (ref >59)

## 2024-03-14 LAB — TSH: TSH: 1.41 u[IU]/mL (ref 0.450–4.500)

## 2024-03-14 LAB — HEPATITIS C ANTIBODY: Hep C Virus Ab: NONREACTIVE

## 2024-03-14 LAB — HEMOGLOBIN A1C
Est. average glucose Bld gHb Est-mCnc: 111 mg/dL
Hgb A1c MFr Bld: 5.5 % (ref 4.8–5.6)

## 2024-03-14 LAB — LIPID PANEL
Chol/HDL Ratio: 3.7 ratio (ref 0.0–4.4)
Cholesterol, Total: 231 mg/dL — ABNORMAL HIGH (ref 100–199)
HDL: 62 mg/dL (ref >39)
LDL Chol Calc (NIH): 147 mg/dL — ABNORMAL HIGH (ref 0–99)
Triglycerides: 123 mg/dL (ref 0–149)
VLDL Cholesterol Cal: 22 mg/dL (ref 5–40)

## 2024-03-14 LAB — VITAMIN D 25 HYDROXY (VIT D DEFICIENCY, FRACTURES): Vit D, 25-Hydroxy: 47 ng/mL (ref 30.0–100.0)

## 2024-03-17 ENCOUNTER — Other Ambulatory Visit: Payer: Self-pay

## 2024-03-17 NOTE — Progress Notes (Signed)
 Specialty Pharmacy Refill Coordination Note  Pamela Lin is a 53 y.o. female assessed today regarding refills of clinic administered specialty medication(s) OnabotulinumtoxinA  (Botox )   Clinic requested Courier to Provider Office   Delivery date: 03/20/24   Injection date: 03/28/24  Verified address: [facility], [address], [place], [place] [address]   Medication will be filled on 03/19/24.

## 2024-03-18 ENCOUNTER — Other Ambulatory Visit: Payer: Self-pay

## 2024-03-19 ENCOUNTER — Ambulatory Visit: Admitting: Adult Health

## 2024-03-20 ENCOUNTER — Ambulatory Visit (INDEPENDENT_AMBULATORY_CARE_PROVIDER_SITE_OTHER)

## 2024-03-20 VITALS — BP 125/80 | HR 63 | Wt 211.0 lb

## 2024-03-20 DIAGNOSIS — Z0182 Encounter for allergy testing: Secondary | ICD-10-CM

## 2024-03-20 DIAGNOSIS — Z23 Encounter for immunization: Secondary | ICD-10-CM

## 2024-03-20 DIAGNOSIS — Z0000 Encounter for general adult medical examination without abnormal findings: Secondary | ICD-10-CM | POA: Insufficient documentation

## 2024-03-20 DIAGNOSIS — G43711 Chronic migraine without aura, intractable, with status migrainosus: Secondary | ICD-10-CM

## 2024-03-20 DIAGNOSIS — F5104 Psychophysiologic insomnia: Secondary | ICD-10-CM

## 2024-03-20 DIAGNOSIS — Z1211 Encounter for screening for malignant neoplasm of colon: Secondary | ICD-10-CM

## 2024-03-20 DIAGNOSIS — G43011 Migraine without aura, intractable, with status migrainosus: Secondary | ICD-10-CM

## 2024-03-20 DIAGNOSIS — E7800 Pure hypercholesterolemia, unspecified: Secondary | ICD-10-CM | POA: Diagnosis not present

## 2024-03-20 MED ORDER — UBRELVY 50 MG PO TABS: 50.0000 mg | ORAL_TABLET | Freq: Every day | ORAL | 0 refills | Status: DC | PRN

## 2024-03-20 MED ORDER — TRAZODONE HCL 50 MG PO TABS: ORAL_TABLET | ORAL | 2 refills | Status: AC

## 2024-03-20 MED ORDER — ATORVASTATIN CALCIUM 10 MG PO TABS: ORAL_TABLET | ORAL | 1 refills | Status: DC

## 2024-03-20 NOTE — Assessment & Plan Note (Signed)
 Continue regular follow up with neurology for botox  injections. Continue Qulipta  50 mg daily. Provided savings card for Ubrelvy  as a rescue medication for acute migraine. Will follow up in 3 months to assess effectiveness of Ubrelvy .

## 2024-03-20 NOTE — Progress Notes (Signed)
 Complete physical exam  Patient: Pamela Lin   DOB: Jan 26, 1971   53 y.o. Female  MRN: [id number]  Subjective:    Chief Complaint  Patient presents with   Acute Visit    Patient in room #5 and alone. Pt states she would like to discuss her headaches and an spot she notice on her right elbow.    Pamela Lin is a 53 y.o. female who presents today for a complete physical exam. She reports consuming a general diet. Exercising as tolerated. She generally feels well. She reports sleeping well. She does have additional problems to discuss today.   Patient has a history of migraines for which she was previously following with neurology for medication management. She does deal with headaches daily but true migraines are only occurring several times per month. She is still following with neurology for botox  injections for treatment of her migraines. She reports that over time, she does believe her migraines have improved some. She is currently on Qulipta  60 mg every day for migraine prevention. Was previously on Nurtec but could not tolerated the ODT formulation due to taste. Would like to discuss rescue options for when she does get a severe migraine. She has tried and failed rizatriptan , topiramate , Nurtec, and Ajovy  in the past for treatment of her migraines.  Of note, she does have a history of a pituitary microadenoma that is stable with normal prolactin levels. Also wonders if anything in her diet may be contributing to her migraines and is interested in pursuing allergy  testing to determine is food intolerances are present.   Most recent fall risk assessment:    03/20/2024    4:42 PM  Fall Risk   Falls in the past year? 0     Most recent depression screenings:    06/06/2023    3:26 PM 03/14/2023    4:17 PM  PHQ 2/9 Scores  PHQ - 2 Score 0 0  PHQ- 9 Score 3 3    Vision:Within last year and Dental: No current dental problems and Receives regular dental  care    Patient Care Team: Gayle Saddie JULIANNA DEVONNA as PCP - General (Physician Assistant) Skeet Juliene SAUNDERS, DO as Consulting Physician (Neurology)   Outpatient Medications Prior to Visit  Medication Sig   azelastine  (ASTELIN ) 0.1 % nasal spray Place 2 sprays into both nostrils as needed for rhinitis. Use in each nostril as directed   botulinum toxin Type A  (BOTOX ) 200 units injection Provider to inject 155 units into the muscles of the head and neck every 12 weeks. Discard remainder.   cyclobenzaprine  (FLEXERIL ) 5 MG tablet TAKE 1 TABLET BY MOUTH 3 TIMES DAILY AS NEEDED FOR MUSCLE SPASMS. (Patient taking differently: Take 5 mg by mouth as needed for muscle spasms.)   fluticasone  (FLONASE ) 50 MCG/ACT nasal spray Place 2 sprays into both nostrils as needed for allergies or rhinitis.   Pseudoephedrine-Naproxen Na (ALEVE-D SINUS & COLD PO) Take by mouth.   QULIPTA  60 MG TABS TAKE 1 TABLET (60 MG TOTAL) BY MOUTH DAILY.   [DISCONTINUED] amoxicillin -clavulanate (AUGMENTIN ) 875-125 MG tablet SMARTSIG:1 Tablet(s) By Mouth Every 12 Hours (Patient not taking: Reported on 11/07/2023)   [DISCONTINUED] atorvastatin  (LIPITOR) 10 MG tablet TAKE 1 TABLET BY MOUTH TWICE PER WEEK.   [DISCONTINUED] traZODone  (DESYREL ) 50 MG tablet TAKE 1 TABLET BY MOUTH AT BEDTIME. MAY INCREASE TO 2 TABLETS AFTER 1 TO 2 WEEKS IF NEEDED. (Patient taking differently: 25 mg as needed.)   No facility-administered medications  prior to visit.    ROS   Per HPI     Objective:     BP 125/80 (BP Location: Left Arm, Patient Position: Sitting, Cuff Size: Normal)   Pulse 63   Wt 211 lb (95.7 kg)   SpO2 99%   BMI 34.06 kg/m    Physical Exam Constitutional:      General: She is not in acute distress.    Appearance: Normal appearance.  HENT:     Right Ear: Tympanic membrane normal.     Left Ear: Tympanic membrane normal.     Mouth/Throat:     Mouth: Mucous membranes are moist.     Pharynx: Oropharynx is clear.  Eyes:      Pupils: Pupils are equal, round, and reactive to light.  Cardiovascular:     Rate and Rhythm: Normal rate and regular rhythm.     Heart sounds: Normal heart sounds. No murmur heard.    No friction rub. No gallop.  Pulmonary:     Effort: Pulmonary effort is normal. No respiratory distress.     Breath sounds: Normal breath sounds.  Abdominal:     General: Abdomen is flat. Bowel sounds are normal.     Palpations: Abdomen is soft.  Musculoskeletal:        General: No swelling.     Cervical back: Normal range of motion.  Lymphadenopathy:     Cervical: No cervical adenopathy.  Skin:    General: Skin is warm and dry.  Neurological:     General: No focal deficit present.     Mental Status: She is alert.  Psychiatric:        Mood and Affect: Mood normal.        Behavior: Behavior normal.        Thought Content: Thought content normal.     No results found for any visits on 03/20/24. Last CBC Lab Results  Component Value Date   WBC 5.6 03/13/2024   HGB 14.4 03/13/2024   HCT 43.1 03/13/2024   MCV 91 03/13/2024   MCH 30.4 03/13/2024   RDW 12.4 03/13/2024   PLT 196 03/13/2024   Last metabolic panel Lab Results  Component Value Date   GLUCOSE 95 03/13/2024   NA 141 03/13/2024   K 4.2 03/13/2024   CL 103 03/13/2024   CO2 23 03/13/2024   BUN 9 03/13/2024   CREATININE 0.70 03/13/2024   EGFR 102 03/13/2024   CALCIUM  9.3 03/13/2024   PROT 6.8 03/13/2024   ALBUMIN 4.5 03/13/2024   LABGLOB 2.3 03/13/2024   AGRATIO 1.8 05/31/2022   BILITOT 0.6 03/13/2024   ALKPHOS 81 03/13/2024   AST 20 03/13/2024   ALT 21 03/13/2024   Last lipids Lab Results  Component Value Date   CHOL 231 (H) 03/13/2024   HDL 62 03/13/2024   LDLCALC 147 (H) 03/13/2024   TRIG 123 03/13/2024   CHOLHDL 3.7 03/13/2024   Last hemoglobin A1c Lab Results  Component Value Date   HGBA1C 5.5 03/13/2024   Last thyroid  functions Lab Results  Component Value Date   TSH 1.410 03/13/2024   T4TOTAL 8.1  01/08/2023   Last vitamin D  Lab Results  Component Value Date   VD25OH 47.0 03/13/2024        Assessment & Plan:    Routine Health Maintenance and Physical Exam  Health Maintenance  Topic Date Due   Hepatitis B Vaccine (1 of 3 - 19+ 3-dose series) Never done   Pneumococcal Vaccine for age  over 50 (1 of 1 - PCV) Never done   COVID-19 Vaccine (3 - Pfizer risk series) 10/27/2019   Pap with HPV screening  01/26/2024   Breast Cancer Screening  03/29/2025   DTaP/Tdap/Td vaccine (3 - Td or Tdap) 02/06/2028   Colon Cancer Screening  02/10/2030   Flu Shot  Completed   Hepatitis C Screening  Completed   HIV Screening  Completed   Zoster (Shingles) Vaccine  Completed   HPV Vaccine  Aged Out   Meningitis B Vaccine  Aged Out    Discussed health benefits of physical activity, and encouraged her to engage in regular exercise appropriate for her age and condition.  Intractable migraine without aura and with status migrainosus -     Ubrelvy ; Take 1 tablet (50 mg total) by mouth daily as needed.  Dispense: 30 tablet; Refill: 0  Elevated LDL cholesterol level Assessment & Plan: Last lipid panel: LDL 147, HDL 62, Trig 123. The 10-year ASCVD risk score (Arnett DK, et al., 2019) is: 2% Will increase atorvastatin  10 mg from twice per week to every MWF and Sunday.  Will recheck lipid panel in 3 months.  Orders: -     Atorvastatin  Calcium ; TAKE 1 TABLET BY MOUTH EVERY MON, WED, FRI, SUN  Dispense: 90 tablet; Refill: 1  Psychophysiological insomnia Assessment & Plan: Stable with 25 mg trazodone  nightly at bedtime. Refills sent today. Will cont to monitor.  Orders: -     traZODone  HCl; TAKE 1 TABLET BY MOUTH AT BEDTIME. MAY INCREASE TO 2 TABLETS AFTER 1 TO 2 WEEKS IF NEEDED.  Dispense: 90 tablet; Refill: 2  Screen for colon cancer -     Ambulatory referral to Gastroenterology  Encounter for allergy  testing -     Food Allergy  Profile  Encounter for immunization -     Flu vaccine  trivalent PF, 6mos and older(Flulaval,Afluria,Fluarix,Fluzone)  Chronic migraine without aura, with intractable migraine, so stated, with status migrainosus Assessment & Plan: Continue regular follow up with neurology for botox  injections. Continue Qulipta  50 mg daily. Provided savings card for Ubrelvy  as a rescue medication for acute migraine. Will follow up in 3 months to assess effectiveness of Ubrelvy .   Wellness examination Assessment & Plan: Santina over lab work in detail with the patient. Answered all patient questions. Went over and encouraged/ordered age-appropriate health screenings to include flu vaccine, PCV vaccine, pap smear, colonoscopy. Patient is up-to-date on all other screenings. Flu vaccine today. Encouraged patient to aim for 150+ minutes of physical activity per week or just increase their physical activity in general in combination with a well balanced diet that prioritizes protein and fiber to support a healthy lifestyle. Patient verbalized understanding and was in agreement with the plan.      Return in about 3 months (around 06/19/2024) for HLD, migraine, pap smear .     [name], PA-C

## 2024-03-20 NOTE — Assessment & Plan Note (Signed)
 Went over lab work in detail with the patient. Answered all patient questions. Went over and encouraged/ordered age-appropriate health screenings to include flu vaccine, PCV vaccine, pap smear, colonoscopy. Patient is up-to-date on all other screenings. Flu vaccine today. Encouraged patient to aim for 150+ minutes of physical activity per week or just increase their physical activity in general in combination with a well balanced diet that prioritizes protein and fiber to support a healthy lifestyle. Patient verbalized understanding and was in agreement with the plan.

## 2024-03-20 NOTE — Patient Instructions (Addendum)
 It was nice to see you today!  As we discussed in clinic:  -Continue atorvastatin  every Mon, Wed, Fri, and Sun  - I have sent in a rescue migraine medication similar to Nurtec called Ubrelvy  - I have placed a referral to GI for your colonoscopy and they will call you to schedule  - I will update you via MyChart with the allergy  testing results  If you have any problems before your next visit feel free to message me via MyChart (minor issues or questions) or call the office, otherwise you may reach out to schedule an office visit.  Thank you! Saddie Sacks, PA-C

## 2024-03-20 NOTE — Assessment & Plan Note (Signed)
 Stable with 25 mg trazodone  nightly at bedtime. Refills sent today. Will cont to monitor.

## 2024-03-20 NOTE — Assessment & Plan Note (Signed)
 Last lipid panel: LDL 147, HDL 62, Trig 123. The 10-year ASCVD risk score (Arnett DK, et al., 2019) is: 2% Will increase atorvastatin  10 mg from twice per week to every MWF and Sunday.  Will recheck lipid panel in 3 months.

## 2024-03-24 ENCOUNTER — Other Ambulatory Visit

## 2024-03-24 ENCOUNTER — Other Ambulatory Visit: Payer: Self-pay

## 2024-03-26 LAB — FOOD ALLERGY PROFILE
Allergen Corn, IgE: <0.1 kU/L
Clam IgE: <0.1 kU/L
Codfish IgE: <0.1 kU/L
Egg White IgE: <0.1 kU/L
Milk IgE: <0.1 kU/L
Peanut IgE: <0.1 kU/L
Scallop IgE: <0.1 kU/L
Sesame Seed IgE: <0.1 kU/L
Shrimp IgE: <0.1 kU/L
Soybean IgE: <0.1 kU/L
Walnut IgE: <0.1 kU/L
Wheat IgE: <0.1 kU/L

## 2024-03-27 ENCOUNTER — Ambulatory Visit: Payer: Self-pay

## 2024-03-27 NOTE — Progress Notes (Unsigned)
 12/26/23: Overall she reports that she is doing well.  She gets a mild headache and is daily but it does not typically last all day long.  Only gets a severe migraine every 3 weeks.  She remains on Qulipta .  When she does get a migraine she typically will take Aleve-D. She saw a chiropractor and feels that helps.  Has tried triptans in the past.  Returns today for Botox  injections  10/01/23: reports that she is going weeks without a severe headache- maybe 1/once.  Remains on Qulipta  daily rarely does she get an aura but it has happened before. She was educated about risk of stroke with estrogen in the setting of migraine with aura. 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  We will check prolactin level and repeat MRI of the brain with and without contrast   07/02/23: She states that her headaches have been manageable.  She still has a daily dull headache.  But only has approximately 3 migraines a month.  04/04/23: reports that she couldn't get Qulipta  for several weeks. During that time she was taking OTC medication. Headache frequency increased during that time.Advised that if she runs our of Qulipta  again- we can provide her with Samples.  She has a daily headache typically however she states that she gets busy she may not even notice it.  She does feel that the medicine helps with the severity of her headaches.  She saw commercial for Vyepti.  Curious if that would be helpful.  We will get her an office visit with Dr. Ines to discuss  01/08/23: Still having daily headaches.  Remains on Qulipta .  Sleep consult was negative for sleep apnea  09/26/22:having daily headaches for the 2.5 months. Can function but has a headache daily. Usually wakes up with the headache. Not sure how much nurtec works. Hasn't taken maxalt  unless its a severe headache. Headache located across the forehead and behind the right ear.   Patients reports that she has never had a sleep test. She does  snore. Feels tired throughout the day.  Tried and failed medication: Nortriptyline , Trokendi , sertraline , Ajovy , tizanidine , Nurtec.   06/29/22: Botox  continues to work well.   03/20/22: Migraines still under good control.   12/26/21: Botox  working well. Didn't really have many headaches until she was getting due for botox .  09/21/21: Headaches have worsened. Daily headaches on the pain scale its 3-4. Takes nurtec 75 mg every other day.  She does report that she has allergies.  Reports that when she would palpate above her eyes it was sore over the last week.  She is congested today.  But states that she is not like this every /day.  She does not take any daily medication for allergies.  Patient was encouraged to follow-up with her PCP as her sinuses could be contributing to daily headaches  06/29/21: Botox  is helping. Hx of pituitary adenoma.  We will recheck prolactin and TSH today.  We will repeat MRI of the brain.  BOTOX  PROCEDURE NOTE FOR MIGRAINE HEADACHE    Contraindications and precautions discussed with patient(above). Aseptic procedure was observed and patient tolerated procedure. Procedure performed by Duwaine Russell, NP  The condition has existed for more than 6 months, and pt does not have a diagnosis of ALS, Myasthenia Gravis or Lambert-Eaton Syndrome.  Risks and benefits of injections discussed and pt agrees to proceed with the procedure.  Written consent obtained   Indication/Diagnosis: chronic migraine BOTOX (G9414) injection was  performed according to protocol by [employer]. 200 units of BOTOX  was dissolved into 4 cc NS.   NDC: [address]-98   Botox - 200 units x 1 vial Lot: [id number] Expiration: 04/2026 NDC: 9976-6078-97   Bacteriostatic 0.9% Sodium Chloride- 4 mL  Lot: [id number] Expiration: 12/2024 NDC: 9590-8033-97         Description of procedure:  The patient was placed in a sitting position. The standard protocol was used for Botox  as follows, with 5 units of  Botox  injected at each site:   -Procerus muscle, midline injection  -Corrugator muscle, bilateral injection  -Frontalis muscle, bilateral injection, with 2 sites each side, medial injection was performed in the upper one third of the frontalis muscle, in the region vertical from the medial inferior edge of the superior orbital rim. The lateral injection was again in the upper one third of the forehead vertically above the lateral limbus of the cornea, 1.5 cm lateral to the medial injection site.  -Temporalis muscle injection, 4 sites, bilaterally. The first injection was 3 cm above the tragus of the ear, second injection site was 1.5 cm to 3 cm up from the first injection site in line with the tragus of the ear. The third injection site was 1.5-3 cm forward between the first 2 injection sites. The fourth injection site was 1.5 cm posterior to the second injection site.  -Occipitalis muscle injection, 3 sites, bilaterally. The first injection was done one half way between the occipital protuberance and the tip of the mastoid process behind the ear. The second injection site was done lateral and superior to the first, 1 fingerbreadth from the first injection. The third injection site was 1 fingerbreadth superiorly and medially from the first injection site.  -Cervical paraspinal muscle injection, 2 sites, bilateral knee first injection site was 1 cm from the midline of the cervical spine, 3 cm inferior to the lower border of the occipital protuberance. The second injection site was 1.5 cm superiorly and laterally to the first injection site.  -Trapezius muscle injection was performed at 3 sites, bilaterally. The first injection site was in the upper trapezius muscle halfway between the inflection point of the neck, and the acromion. The second injection site was one half way between the acromion and the first injection site. The third injection was done between the first injection site and the  inflection point of the neck.   Will return for repeat injection in 3 months.   A 200 unit sof Botox  was used, 155 units were injected, the rest of the Botox  was wasted. The patient tolerated the procedure well, there were no complications of the above procedure.  Duwaine Russell, MSN, NP-C 03/27/2024, 3:48 PM [facility] [address] [place], [place] [address] [phone]

## 2024-03-28 ENCOUNTER — Ambulatory Visit: Admitting: Adult Health

## 2024-03-28 VITALS — BP 137/75 | HR 66

## 2024-03-28 DIAGNOSIS — G43709 Chronic migraine without aura, not intractable, without status migrainosus: Secondary | ICD-10-CM | POA: Diagnosis not present

## 2024-03-28 MED ORDER — ONABOTULINUMTOXINA 200 UNITS IJ SOLR
155.0000 [IU] | Freq: Once | INTRAMUSCULAR | Status: AC
  Administered 2024-03-28: 155 [IU] via INTRAMUSCULAR

## 2024-03-28 NOTE — Progress Notes (Signed)
 Botox - 200 units x 1 vial Lot: [id number] Expiration: 05/2026 NDC: 9976-6078-97  Bacteriostatic 0.9% Sodium Chloride- 4 mL  Lot: [id number] Expiration: 05/09/2025 NDC: 9590-8033-97  Dx: H56.290 S/P  Witnessed by Nena GRADE RN

## 2024-03-28 NOTE — Patient Instructions (Signed)
 Consider adding on Vypeti or emgality We would have to stop qulipta 

## 2024-04-01 ENCOUNTER — Other Ambulatory Visit (HOSPITAL_COMMUNITY): Payer: Self-pay

## 2024-04-01 ENCOUNTER — Telehealth: Payer: Self-pay

## 2024-04-01 NOTE — Telephone Encounter (Signed)
 Pharmacy Patient Advocate Encounter  Received notification from [facility] that Prior Authorization for Qulipta  has been APPROVED from 04/01/2024 to 07/01/2024. Unable to obtain price due to refill too soon rejection, last fill date 03/17/2024 next available fill [id number]   PA #/Case ID/Reference #: [id number]

## 2024-04-01 NOTE — Telephone Encounter (Signed)
 Pharmacy Patient Advocate Encounter   Received notification from CoverMyMeds that prior authorization for Qulipta  is required/requested.   Insurance verification completed.   The patient is insured through [facility] .   Per test claim: PA required; PA submitted to above mentioned insurance via Latent Key/confirmation #/EOC [id number] Status is pending

## 2024-04-09 ENCOUNTER — Encounter: Payer: Self-pay | Admitting: Adult Health

## 2024-04-14 ENCOUNTER — Telehealth: Payer: Self-pay | Admitting: Pharmacist

## 2024-04-14 MED ORDER — EMGALITY 120 MG/ML ~~LOC~~ SOAJ: 120.0000 mg | SUBCUTANEOUS | 11 refills | Status: AC

## 2024-04-14 NOTE — Telephone Encounter (Signed)
 Pharmacy Patient Advocate Encounter   Received notification from Patient Pharmacy that prior authorization for Emgality 120MG /ML auto-injectors (migraine) is required/requested.   Insurance verification completed.   The patient is insured through [facility].   Per test claim: PA required; PA submitted to above mentioned insurance via CoverMyMeds Key/confirmation #/EOC [id number] Status is pending

## 2024-04-14 NOTE — Addendum Note (Signed)
 Addended by: SHERRYL DUWAINE SQUIBB on: 04/14/2024 02:58 PM   Modules accepted: Orders

## 2024-04-15 NOTE — Telephone Encounter (Signed)
 Pharmacy Patient Advocate Encounter  Received notification from [facility] that Prior Authorization for Emgality 120MG /ML auto-injectors (migraine) has been APPROVED from 04/14/2024 to 07/15/2024   PA #/Case ID/Reference #: [id number]

## 2024-04-21 ENCOUNTER — Other Ambulatory Visit: Payer: Self-pay | Admitting: Family

## 2024-04-21 DIAGNOSIS — Z1231 Encounter for screening mammogram for malignant neoplasm of breast: Secondary | ICD-10-CM

## 2024-04-22 ENCOUNTER — Other Ambulatory Visit: Payer: Self-pay | Admitting: Medical Genetics

## 2024-04-22 ENCOUNTER — Other Ambulatory Visit: Payer: Self-pay

## 2024-04-22 ENCOUNTER — Inpatient Hospital Stay
Admission: RE | Admit: 2024-04-22 | Discharge: 2024-04-22 | Disposition: A | Payer: BC Managed Care – PPO | Source: Ambulatory Visit

## 2024-04-22 DIAGNOSIS — Z1231 Encounter for screening mammogram for malignant neoplasm of breast: Secondary | ICD-10-CM

## 2024-06-03 ENCOUNTER — Other Ambulatory Visit: Payer: Self-pay

## 2024-06-17 ENCOUNTER — Other Ambulatory Visit (HOSPITAL_COMMUNITY): Payer: Self-pay

## 2024-06-17 ENCOUNTER — Other Ambulatory Visit: Payer: Self-pay

## 2024-06-17 NOTE — Progress Notes (Signed)
 Specialty Pharmacy Refill Coordination Note  Pamela Lin is a 53 y.o. female assessed today regarding refills of clinic administered specialty medication(s) OnabotulinumtoxinA  (Botox )   Clinic requested Courier to Provider Office   Delivery date: 06/19/24   Verified address: [facility], [address], [place], [place] [address]   Medication will be filled on: 06/18/24

## 2024-06-18 ENCOUNTER — Other Ambulatory Visit: Payer: Self-pay

## 2024-06-23 ENCOUNTER — Other Ambulatory Visit (HOSPITAL_COMMUNITY): Payer: Self-pay

## 2024-06-23 ENCOUNTER — Telehealth: Payer: Self-pay

## 2024-06-23 NOTE — Telephone Encounter (Signed)
 Pharmacy Patient Advocate Encounter   Received notification from CoverMyMeds that prior authorization for Emgality  is required/requested.   Insurance verification completed.   The patient is insured through [facility].   Per test claim: PA required; PA started via CoverMyMeds. KEY [id number] . Waiting for clinical questions to populate.

## 2024-06-24 ENCOUNTER — Other Ambulatory Visit (HOSPITAL_COMMUNITY): Payer: Self-pay

## 2024-06-24 ENCOUNTER — Ambulatory Visit: Admitting: Adult Health

## 2024-06-24 VITALS — BP 131/69 | HR 66

## 2024-06-24 DIAGNOSIS — G43011 Migraine without aura, intractable, with status migrainosus: Secondary | ICD-10-CM

## 2024-06-24 DIAGNOSIS — G43711 Chronic migraine without aura, intractable, with status migrainosus: Secondary | ICD-10-CM

## 2024-06-24 MED ORDER — ONABOTULINUMTOXINA 200 UNITS IJ SOLR
155.0000 [IU] | Freq: Once | INTRAMUSCULAR | Status: AC
  Administered 2024-06-24: 09:00:00 155 [IU] via INTRAMUSCULAR

## 2024-06-24 MED ORDER — UBRELVY 100 MG PO TABS: ORAL_TABLET | ORAL | 11 refills | Status: AC

## 2024-06-24 NOTE — Progress Notes (Signed)
 Botox - 200 units x 1 vial Lot: [id number] Expiration: 12/2025 NDC: 9976-6078-97  Bacteriostatic 0.9% Sodium Chloride- 4 mL  Lot: [id number] Expiration: 09/2025 NDC: 9590-8033-97  Dx: H56.290  S/P  Witnessed by Stephania lather

## 2024-06-24 NOTE — Telephone Encounter (Signed)
 Pharmacy Patient Advocate Encounter  Received notification from [facility] that Prior Authorization for Emgality  has been APPROVED from 06/24/2024 to 06/24/2025. Unable to obtain price due to refill too soon rejection, last fill date 06/14/2024 next available fill date12/28/2025   PA #/Case ID/Reference #: [id number]   LAST FILL DT [id number] FILLED AT [facility],PHONE #[phone]

## 2024-06-24 NOTE — Telephone Encounter (Signed)
 Clinical questions have been answered and PA submitted. PA currently Pending. Please be advised that most companies allow up to 30 days to make a decision. We will advise when a determination has been made, or follow up in 1 week.   Please reach out to our team, Rx Prior Auth Pool, if you haven't heard back in a week.

## 2024-06-24 NOTE — Progress Notes (Signed)
 06/24/24: Emgality  is working well- has noticed a reduction. 1 migraine a week, mild headaches 4 times a week. She now goes a good part of the day without even having a headache. PCP gave her ubrelvy  but she never got pharmacy to refill.   03/28/24: reports that she typically has a headache every other day. Severity varies. Last two weeks were bad but better this week. Drinking a different coffee and wonders if that has helped her migraines. Remains on Qulipta . We discussed potentially adding on or switching to emgality  or veypti. However qulipta  would have to be stopped.   Last week sudden onset of chest pain- hard to take a deep breath. Took ASA. After 2.5 hours then suddenly stopped. PCP- thought it might be reflux.   12/26/23: Overall she reports that she is doing well.  She gets a mild headache and is daily but it does not typically last all day long.  Only gets a severe migraine every 3 weeks.  She remains on Qulipta .  When she does get a migraine she typically will take Aleve-D. She saw a chiropractor and feels that helps.  Has tried triptans in the past.  Returns today for Botox  injections  10/01/23: reports that she is going weeks without a severe headache- maybe 1/once.  Remains on Qulipta  daily rarely does she get an aura but it has happened before. She was educated about risk of stroke with estrogen in the setting of migraine with aura. 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  We will check prolactin level and repeat MRI of the brain with and without contrast   07/02/23: She states that her headaches have been manageable.  She still has a daily dull headache.  But only has approximately 3 migraines a month.  04/04/23: reports that she couldn't get Qulipta  for several weeks. During that time she was taking OTC medication. Headache frequency increased during that time.Advised that if she runs our of Qulipta  again- we can provide her with Samples.  She has a daily  headache typically however she states that she gets busy she may not even notice it.  She does feel that the medicine helps with the severity of her headaches.  She saw commercial for Vyepti.  Curious if that would be helpful.  We will get her an office visit with Dr. Ines to discuss  01/08/23: Still having daily headaches.  Remains on Qulipta .  Sleep consult was negative for sleep apnea  09/26/22:having daily headaches for the 2.5 months. Can function but has a headache daily. Usually wakes up with the headache. Not sure how much nurtec works. Hasn't taken maxalt  unless its a severe headache. Headache located across the forehead and behind the right ear.   Patients reports that she has never had a sleep test. She does snore. Feels tired throughout the day.  Tried and failed medication: Nortriptyline , Trokendi , sertraline , Ajovy , tizanidine , Nurtec.   06/29/22: Botox  continues to work well.   03/20/22: Migraines still under good control.   12/26/21: Botox  working well. Didn't really have many headaches until she was getting due for botox .  09/21/21: Headaches have worsened. Daily headaches on the pain scale its 3-4. Takes nurtec 75 mg every other day.  She does report that she has allergies.  Reports that when she would palpate above her eyes it was sore over the last week.  She is congested today.  But states that she is not like this every /day.  She  does not take any daily medication for allergies.  Patient was encouraged to follow-up with her PCP as her sinuses could be contributing to daily headaches  06/29/21: Botox  is helping. Hx of pituitary adenoma.  We will recheck prolactin and TSH today.  We will repeat MRI of the brain.  BOTOX  PROCEDURE NOTE FOR MIGRAINE HEADACHE    Contraindications and precautions discussed with patient(above). Aseptic procedure was observed and patient tolerated procedure. Procedure performed by Duwaine Russell, NP  The condition has existed for more than 6  months, and pt does not have a diagnosis of ALS, Myasthenia Gravis or Lambert-Eaton Syndrome.  Risks and benefits of injections discussed and pt agrees to proceed with the procedure.  Written consent obtained   Indication/Diagnosis: chronic migraine BOTOX (G9414) injection was performed according to protocol by [employer]. 200 units of BOTOX  was dissolved into 4 cc NS.   NDC: [address]-01   Botox - 200 units x 1 vial Lot: [id number] Expiration: 12/2025 NDC: 9976-6078-97   Bacteriostatic 0.9% Sodium Chloride- 4 mL  Lot: [id number] Expiration: 09/2025 NDC: 9590-8033-97   Dx: H56.290          Description of procedure:  The patient was placed in a sitting position. The standard protocol was used for Botox  as follows, with 5 units of Botox  injected at each site:   -Procerus muscle, midline injection  -Corrugator muscle, bilateral injection  -Frontalis muscle, bilateral injection, with 2 sites each side, medial injection was performed in the upper one third of the frontalis muscle, in the region vertical from the medial inferior edge of the superior orbital rim. The lateral injection was again in the upper one third of the forehead vertically above the lateral limbus of the cornea, 1.5 cm lateral to the medial injection site.  -Temporalis muscle injection, 4 sites, bilaterally. The first injection was 3 cm above the tragus of the ear, second injection site was 1.5 cm to 3 cm up from the first injection site in line with the tragus of the ear. The third injection site was 1.5-3 cm forward between the first 2 injection sites. The fourth injection site was 1.5 cm posterior to the second injection site.  -Occipitalis muscle injection, 3 sites, bilaterally. The first injection was done one half way between the occipital protuberance and the tip of the mastoid process behind the ear. The second injection site was done lateral and superior to the first, 1 fingerbreadth from the first injection. The  third injection site was 1 fingerbreadth superiorly and medially from the first injection site.  -Cervical paraspinal muscle injection, 2 sites, bilateral knee first injection site was 1 cm from the midline of the cervical spine, 3 cm inferior to the lower border of the occipital protuberance. The second injection site was 1.5 cm superiorly and laterally to the first injection site.  -Trapezius muscle injection was performed at 3 sites, bilaterally. The first injection site was in the upper trapezius muscle halfway between the inflection point of the neck, and the acromion. The second injection site was one half way between the acromion and the first injection site. The third injection was done between the first injection site and the inflection point of the neck.   Will return for repeat injection in 3 months.   A 200 unit sof Botox  was used, 155 units were injected, the rest of the Botox  was wasted. The patient tolerated the procedure well, there were no complications of the above procedure.  Duwaine Russell, MSN, NP-C 06/24/2024, 8:30 AM [facility] [address] [place], [place] [address] [phone]

## 2024-06-25 ENCOUNTER — Other Ambulatory Visit (HOSPITAL_COMMUNITY): Payer: Self-pay

## 2024-06-25 ENCOUNTER — Telehealth: Payer: Self-pay

## 2024-06-25 NOTE — Telephone Encounter (Signed)
 Pharmacy Patient Advocate Encounter   Received notification from CoverMyMeds that prior authorization for Ubrelvy  100mg  Tablet is required/requested.   Insurance verification completed.   The patient is insured through [facility].   Per test claim: PA required; PA started via CoverMyMeds. KEY [id number] . Waiting for clinical questions to populate.

## 2024-06-25 NOTE — Telephone Encounter (Signed)
 Hello Prescriber!  We are in the process of submitting a prior authorization for your patient for Ubrelvy  100mg  Tablet. We are reaching out for clinical guidance for the following information to complete the request: The patient's plan requires documentation showing that patient to has tried and failed, or have contraindication to the following agents before they will approve our request: TWO TRIPTANS. Or please advise if you would like to switch prescribed medication per insurance requirement.  I can only find Rizatriptan  documented in chart, Previous visits to [facility] had tried no triptans and since [facility] only been prescribed rizatriptan .    Thank you! Pharmacy Team

## 2024-06-26 NOTE — Telephone Encounter (Signed)
 She could try sumatriptan.  Lets make sure she did not have any side effects or tolerance issues with rizatriptan .  Also make sure she does not have any new contraindications.

## 2024-06-30 ENCOUNTER — Encounter: Payer: Self-pay | Admitting: Internal Medicine

## 2024-06-30 MED ORDER — SUMATRIPTAN SUCCINATE 100 MG PO TABS: ORAL_TABLET | ORAL | 11 refills | Status: AC

## 2024-07-01 ENCOUNTER — Ambulatory Visit

## 2024-07-01 ENCOUNTER — Other Ambulatory Visit (HOSPITAL_COMMUNITY): Admission: RE | Admit: 2024-07-01 | Discharge: 2024-07-01 | Disposition: A | Source: Ambulatory Visit

## 2024-07-01 VITALS — BP 125/80 | HR 62 | Temp 98.8°F | Ht 66.0 in | Wt 216.0 lb

## 2024-07-01 DIAGNOSIS — E7800 Pure hypercholesterolemia, unspecified: Secondary | ICD-10-CM | POA: Diagnosis not present

## 2024-07-01 DIAGNOSIS — Z124 Encounter for screening for malignant neoplasm of cervix: Secondary | ICD-10-CM | POA: Insufficient documentation

## 2024-07-01 DIAGNOSIS — R0789 Other chest pain: Secondary | ICD-10-CM | POA: Diagnosis not present

## 2024-07-01 DIAGNOSIS — M7989 Other specified soft tissue disorders: Secondary | ICD-10-CM | POA: Insufficient documentation

## 2024-07-01 MED ORDER — ATORVASTATIN CALCIUM 10 MG PO TABS: ORAL_TABLET | ORAL | 1 refills | Status: AC

## 2024-07-01 NOTE — Assessment & Plan Note (Signed)
 Rechecking lipid panel with labs. Tolerating atorvastatin  10 mg 4 times per week. Rechecking lipid panel with labs.  The 10-year ASCVD risk score (Arnett DK, et al., 2019) is: 2% Discussed safety of grapefruit use and atorvastatin . Advised that atorvastatin  is a medication affected by grapefruit consumption. Studies have shown that the enzyme in grapefruit juice causes statin toxicity with large amounts of furanocoumarins. Risks discussed. Advised to trial eating half of a grapefruit on her off days from atorvastatin  and notify for any symptoms of muscle aches, dark urine, fatigue, etc.

## 2024-07-01 NOTE — Assessment & Plan Note (Signed)
 Localized edema of hands Recent onset of hand swelling. Possible dietary sodium intake contributing to edema. Discussed potential use of hydrochlorothiazide if dietary changes do not improve symptoms. - Advised reduction of dietary sodium intake. - Ordered kidney function and sodium level tests. - Will consider hydrochlorothiazide if dietary changes do not improve symptoms.

## 2024-07-01 NOTE — Progress Notes (Signed)
 "  Established Patient Office Visit  Subjective   Patient ID: Pamela Lin, female    DOB: [birth year]  Age: 53 y.o. MRN: [id number]  Chief Complaint  Patient presents with   Medical Management of Chronic Issues    HPI  History of Present Illness   Pamela Lin is a 53 year old female with migraines and hyperlipidemia who presents for medication management and follow-up.  Migraine headaches - Ongoing management with recent Botox  injection. Following with Neurology for management  - Ubrelvy  prescription not approved - Started Emgality , which is providing effective relief  Chest pain - No recent episodes of chest pain - Last episode occurred prior to previous visit - No CP, SOB, vision changes, palpitations, etc.   Hyperlipidemia management - Statin therapy (atorvastatin ) increased to four times per week (previously only taken twice per week). Tolerating well and without side effects.  - Inquires about consuming grapefruit on non-medication days as she knows this is a contraindication but really enjoys the fruit.  - Running low on statin medication and requires refill  Peripheral edema - Swelling in hands for past three weeks to one month - Difficulty removing rings and tightness in fingers, especially at the end of the day  - No swelling in legs or ankles, no persistent cough  - Maintains adequate hydration - Endorses a diet that may be high in sodium   Sleep disturbance - Continues trazodone  at bedtime, using half of a 50 mg tablet which is effective for her       ROS Per HPI.    Objective:     BP 125/80   Pulse 62   Temp 98.8 F (37.1 C) (Oral)   Ht 5' 6 (1.676 m)   Wt 216 lb 0.6 oz (98 kg)   SpO2 98%   BMI 34.87 kg/m    Physical Exam Exam conducted with a chaperone present.  Constitutional:      General: She is not in acute distress.    Appearance: Normal appearance.  Cardiovascular:     Rate and Rhythm: Normal rate and regular  rhythm.     Heart sounds: Normal heart sounds. No murmur heard.    No friction rub. No gallop.  Pulmonary:     Effort: Pulmonary effort is normal. No respiratory distress.     Breath sounds: Normal breath sounds.  Abdominal:     General: Bowel sounds are normal.  Genitourinary:    General: Normal vulva.     Exam position: Lithotomy position.     Labia:        Right: No rash or lesion.        Left: No rash or lesion.      Vagina: Normal.     Cervix: Normal.     Uterus: Normal.      Adnexa: Right adnexa normal and left adnexa normal.  Musculoskeletal:        General: No swelling.     Cervical back: Neck supple.  Lymphadenopathy:     Cervical: No cervical adenopathy.  Skin:    General: Skin is warm and dry.  Neurological:     General: No focal deficit present.     Mental Status: She is alert.  Psychiatric:        Mood and Affect: Mood normal.        Behavior: Behavior normal.        Thought Content: Thought content normal.      No results found for any  visits on 07/01/24.    The 10-year ASCVD risk score (Arnett DK, et al., 2019) is: 2%    Assessment & Plan:   Other chest pain  Cervical cancer screening -     Cytology - PAP  Elevated LDL cholesterol level Assessment & Plan: Rechecking lipid panel with labs. Tolerating atorvastatin  10 mg 4 times per week. Rechecking lipid panel with labs.  The 10-year ASCVD risk score (Arnett DK, et al., 2019) is: 2% Discussed safety of grapefruit use and atorvastatin . Advised that atorvastatin  is a medication affected by grapefruit consumption. Studies have shown that the enzyme in grapefruit juice causes statin toxicity with large amounts of furanocoumarins. Risks discussed. Advised to trial eating half of a grapefruit on her off days from atorvastatin  and notify for any symptoms of muscle aches, dark urine, fatigue, etc.   Orders: -     Atorvastatin  Calcium ; TAKE 1 TABLET BY MOUTH EVERY MON, WED, FRI, SUN  Dispense: 90 tablet;  Refill: 1 -     Lipid panel; Future -     Comprehensive metabolic panel with GFR; Future  Bilateral hand swelling Assessment & Plan: Localized edema of hands Recent onset of hand swelling. Possible dietary sodium intake contributing to edema. Discussed potential use of hydrochlorothiazide if dietary changes do not improve symptoms. - Advised reduction of dietary sodium intake. - Ordered kidney function and sodium level tests. - Will consider hydrochlorothiazide if dietary changes do not improve symptoms.     Return in about 9 months (around 04/01/2025) for Physical.    Pamela JULIANNA Sacks, PA-C  "

## 2024-07-01 NOTE — Patient Instructions (Signed)
 VISIT SUMMARY: Today, we discussed the management of your migraines, hyperlipidemia, and other health concerns. We also reviewed your recent symptoms and made adjustments to your treatment plan as needed.  YOUR PLAN: MIGRAINE HEADACHES: You are currently managing your migraines with Botox  injections and Emgality , which is providing effective relief. -Continue with Emgality  for migraine relief. -We will monitor the effectiveness of your current treatment.  HYPERCHOLESTEROLEMIA: Your cholesterol levels are being managed with atorvastatin , which has been increased to four times a week. -Continue taking atorvastatin  four times a week. -You can consume grapefruit on the days you do not take atorvastatin . -A fasting cholesterol panel has been ordered to assess your response to the increased dosage. -Your atorvastatin  prescription has been refilled.  CHEST PAIN: You have not experienced any recent episodes of chest pain. -Continue to monitor for any recurrence of chest pain. -If you experience further episodes, we may consider a stress test.  LOCALIZED EDEMA OF HANDS: You have experienced swelling in your hands, possibly due to dietary sodium intake. -Reduce your dietary sodium intake. -Kidney function and sodium level tests have been ordered. -If dietary changes do not improve your symptoms, we may consider hydrochlorothiazide.  SLEEP DISTURBANCE: You are taking trazodone  at bedtime, which you find effective for sleep. -Continue taking trazodone  as prescribed.  GENERAL HEALTH MAINTENANCE: We reviewed your upcoming and recent health screenings and vaccinations. -Proceed with your scheduled colonoscopy over spring break. -You are eligible for the pneumonia vaccine and can receive it after Christmas if you prefer. -Your Pap smear was performed today, and the results will be sent via MyChart.  If you have any problems before your next visit feel free to message me via MyChart (minor issues or  questions) or call the office, otherwise you may reach out to schedule an office visit.  Thank you! Saddie Sacks, PA-C

## 2024-07-02 LAB — CYTOLOGY - PAP
Chlamydia: NEGATIVE
Comment: NEGATIVE
Comment: NEGATIVE
Comment: NEGATIVE
Comment: NORMAL
Diagnosis: NEGATIVE
Diagnosis: REACTIVE
High risk HPV: NEGATIVE
Neisseria Gonorrhea: NEGATIVE
Trichomonas: NEGATIVE

## 2024-07-04 ENCOUNTER — Ambulatory Visit: Payer: Self-pay

## 2024-07-07 ENCOUNTER — Other Ambulatory Visit

## 2024-07-07 DIAGNOSIS — E7800 Pure hypercholesterolemia, unspecified: Secondary | ICD-10-CM

## 2024-07-08 ENCOUNTER — Other Ambulatory Visit

## 2024-07-08 DIAGNOSIS — Z006 Encounter for examination for normal comparison and control in clinical research program: Secondary | ICD-10-CM

## 2024-07-08 LAB — COMPREHENSIVE METABOLIC PANEL WITH GFR
ALT: 23 IU/L (ref 0–32)
AST: 20 IU/L (ref 0–40)
Albumin: 4.1 g/dL (ref 3.8–4.9)
Alkaline Phosphatase: 69 IU/L (ref 49–135)
BUN/Creatinine Ratio: 14 (ref 9–23)
BUN: 10 mg/dL (ref 6–24)
Bilirubin Total: 0.6 mg/dL (ref 0.0–1.2)
CO2: 22 mmol/L (ref 20–29)
Calcium: 9.3 mg/dL (ref 8.7–10.2)
Chloride: 101 mmol/L (ref 96–106)
Creatinine, Ser: 0.71 mg/dL (ref 0.57–1.00)
Globulin, Total: 2.1 g/dL (ref 1.5–4.5)
Glucose: 81 mg/dL (ref 70–99)
Potassium: 4 mmol/L (ref 3.5–5.2)
Sodium: 140 mmol/L (ref 134–144)
Total Protein: 6.2 g/dL (ref 6.0–8.5)
eGFR: 100 mL/min/1.73

## 2024-07-08 LAB — LIPID PANEL
Chol/HDL Ratio: 2.7 ratio (ref 0.0–4.4)
Cholesterol, Total: 182 mg/dL (ref 100–199)
HDL: 67 mg/dL
LDL Chol Calc (NIH): 91 mg/dL (ref 0–99)
Triglycerides: 142 mg/dL (ref 0–149)
VLDL Cholesterol Cal: 24 mg/dL (ref 5–40)

## 2024-07-15 ENCOUNTER — Encounter

## 2024-07-29 ENCOUNTER — Encounter: Admitting: Internal Medicine

## 2024-08-01 LAB — GENECONNECT MOLECULAR SCREEN

## 2024-08-05 ENCOUNTER — Telehealth: Payer: Self-pay | Admitting: Medical Genetics

## 2024-09-22 ENCOUNTER — Ambulatory Visit: Admitting: Adult Health

## 2025-04-02 ENCOUNTER — Encounter
